# Patient Record
Sex: Female | Born: 1945 | Race: White | Smoking: Former smoker
Health system: Southern US, Community
[De-identification: ages and names within clinical notes are randomized; demographics above are authoritative.]

## PROBLEM LIST (undated history)

## (undated) DIAGNOSIS — G43909 Migraine, unspecified, not intractable, without status migrainosus: Secondary | ICD-10-CM

## (undated) DIAGNOSIS — N390 Urinary tract infection, site not specified: Secondary | ICD-10-CM

## (undated) DIAGNOSIS — Z9889 Other specified postprocedural states: Secondary | ICD-10-CM

## (undated) DIAGNOSIS — K219 Gastro-esophageal reflux disease without esophagitis: Secondary | ICD-10-CM

## (undated) DIAGNOSIS — R112 Nausea with vomiting, unspecified: Secondary | ICD-10-CM

## (undated) DIAGNOSIS — R32 Unspecified urinary incontinence: Secondary | ICD-10-CM

## (undated) DIAGNOSIS — T8859XA Other complications of anesthesia, initial encounter: Secondary | ICD-10-CM

## (undated) DIAGNOSIS — D649 Anemia, unspecified: Secondary | ICD-10-CM

## (undated) DIAGNOSIS — Z5189 Encounter for other specified aftercare: Secondary | ICD-10-CM

## (undated) DIAGNOSIS — G2581 Restless legs syndrome: Secondary | ICD-10-CM

## (undated) DIAGNOSIS — Z9989 Dependence on other enabling machines and devices: Secondary | ICD-10-CM

## (undated) DIAGNOSIS — M1990 Unspecified osteoarthritis, unspecified site: Secondary | ICD-10-CM

## (undated) DIAGNOSIS — T4145XA Adverse effect of unspecified anesthetic, initial encounter: Secondary | ICD-10-CM

## (undated) DIAGNOSIS — G4730 Sleep apnea, unspecified: Secondary | ICD-10-CM

## (undated) HISTORY — DX: Unspecified urinary incontinence: R32

## (undated) HISTORY — DX: Restless legs syndrome: G25.81

## (undated) HISTORY — DX: Dependence on other enabling machines and devices: Z99.89

## (undated) HISTORY — DX: Unspecified osteoarthritis, unspecified site: M19.90

## (undated) HISTORY — DX: Encounter for other specified aftercare: Z51.89

## (undated) HISTORY — PX: COLONOSCOPY: SHX174

## (undated) HISTORY — PX: APPENDECTOMY: SHX54

## (undated) HISTORY — PX: BREAST EXCISIONAL BIOPSY: SUR124

## (undated) HISTORY — PX: CHOLECYSTECTOMY: SHX55

## (undated) HISTORY — DX: Gastro-esophageal reflux disease without esophagitis: K21.9

## (undated) HISTORY — DX: Migraine, unspecified, not intractable, without status migrainosus: G43.909

## (undated) HISTORY — DX: Urinary tract infection, site not specified: N39.0

## (undated) HISTORY — PX: ABDOMINAL HYSTERECTOMY: SHX81

## (undated) HISTORY — DX: Anemia, unspecified: D64.9

## (undated) MED FILL — Iron Sucrose Inj 20 MG/ML (Fe Equiv): INTRAVENOUS | Qty: 10 | Status: AC

---

## 1972-10-02 HISTORY — PX: AUGMENTATION MAMMAPLASTY: SUR837

## 2011-10-03 HISTORY — PX: STOMACH SURGERY: SHX791

## 2013-10-02 DIAGNOSIS — G2581 Restless legs syndrome: Secondary | ICD-10-CM

## 2013-10-02 HISTORY — DX: Restless legs syndrome: G25.81

## 2017-05-02 HISTORY — PX: UPPER GASTROINTESTINAL ENDOSCOPY: SHX188

## 2017-07-31 DIAGNOSIS — K219 Gastro-esophageal reflux disease without esophagitis: Secondary | ICD-10-CM | POA: Insufficient documentation

## 2017-07-31 DIAGNOSIS — Z0000 Encounter for general adult medical examination without abnormal findings: Secondary | ICD-10-CM | POA: Insufficient documentation

## 2017-07-31 DIAGNOSIS — G4733 Obstructive sleep apnea (adult) (pediatric): Secondary | ICD-10-CM | POA: Insufficient documentation

## 2017-07-31 DIAGNOSIS — D500 Iron deficiency anemia secondary to blood loss (chronic): Secondary | ICD-10-CM | POA: Insufficient documentation

## 2017-07-31 DIAGNOSIS — Z9989 Dependence on other enabling machines and devices: Secondary | ICD-10-CM

## 2017-07-31 DIAGNOSIS — G43009 Migraine without aura, not intractable, without status migrainosus: Secondary | ICD-10-CM | POA: Insufficient documentation

## 2017-07-31 DIAGNOSIS — G2581 Restless legs syndrome: Secondary | ICD-10-CM | POA: Insufficient documentation

## 2017-08-31 ENCOUNTER — Ambulatory Visit: Payer: Self-pay | Admitting: Gastroenterology

## 2017-08-31 ENCOUNTER — Ambulatory Visit (INDEPENDENT_AMBULATORY_CARE_PROVIDER_SITE_OTHER): Payer: Medicare Other | Admitting: Gastroenterology

## 2017-08-31 ENCOUNTER — Encounter (INDEPENDENT_AMBULATORY_CARE_PROVIDER_SITE_OTHER): Payer: Self-pay

## 2017-08-31 ENCOUNTER — Encounter: Payer: Self-pay | Admitting: Gastroenterology

## 2017-08-31 VITALS — BP 128/71 | HR 93 | Temp 98.0°F | Ht 62.0 in | Wt 136.0 lb

## 2017-08-31 DIAGNOSIS — Z903 Acquired absence of stomach [part of]: Secondary | ICD-10-CM | POA: Diagnosis not present

## 2017-08-31 DIAGNOSIS — D508 Other iron deficiency anemias: Secondary | ICD-10-CM

## 2017-08-31 NOTE — Progress Notes (Signed)
[name], MD [address] [place], [place], [place], [address] [address], [place], [place], [place], [address] Phone: [phone]  Fax: [phone]  Consultation  Referring Provider:     [name], MD Primary Care Physician:  [name], MD Primary Gastroenterologist:  Virgel Manifold, MD        Reason for Consultation:     Anemia, Gastritis  Date of Consultation:  08/31/2017         HPI:   Jocelyn Case is a 71 y.o. female who recently moved here from [place], and presents for to establish GI care here.  I do not have access to any of her previous records, except for her primary care note from Dr. Ouida Sills last month.  She states all of her previous records from [place] were given to Dr. Tonette Bihari office, but they are not available in care everywhere at this time.   Patient has history of gastrectomy, that she states was done 10 years ago, due to ulcers.   since that surgery, she had an EGD in August 2018 due to nausea vomiting, with as per patient, initial EGD having food in stomach and so the procedure could not be completed.  2 weeks later the procedure was repeated after 2 days of fasting, and the procedure was able to be completed.  She states that they saw an ulcer with some bleeding at that time.  She has not had to have any EGDs other than that since the surgery or CT scans.  No admissions since then either.  After the surgery she had some nausea vomiting, and since then she has been on Reglan, and Zofran which controlled her symptoms.  She states she has been getting IV iron infusions as her iron has been low, she gets these every 6 months.  Last time was in April 2018.  She denies any dysphagia.  She denies any current nausea or vomiting.  It caused abdominal cramping and so that was stopped.  States that she was tried on oral iron but it caused abdominal cramping and so that was stopped.  She denies any altered bowel habits, no constipation  or diarrhea.  No blood in stool.  She reports history of heartburn, she is on Protonix once daily for this.  She still gets breakthrough symptoms 2-3 times a week if she eats broccoli or fatty foods.  She takes Pepto-Bismol for breakthrough symptoms and that helps.  She sleeps on a reclining bed.  Patient states she was seeing Dr. Gwynneth Albright at the [facility] for her IV iron infusions: [place], [place] Fax: [place], [place] Fax: [phone]  Her PCP was Dr. Monico Blitz, [place], [place]. [phone] Fax: [phone]    Past Medical History:  Diagnosis Date  . Anemia     Past Surgical History:  Procedure Laterality Date  . COLONOSCOPY  2 years ago  . UPPER GASTROINTESTINAL ENDOSCOPY  05/2017   pt stated "bleeding ulcer and stopped up duodenum was noted"    Prior to Admission medications   Medication Sig Start Date End Date Taking? Authorizing Provider  colestipol (COLESTID) 1 g tablet Take by mouth. 07/31/17 07/31/18 Yes [provider]  cyanocobalamin (,VITAMIN B-12,) 1000 MCG/ML injection Inject 1,000 mcg into the muscle every 30 (thirty) days.   Yes [provider]  estrogens, conjugated, (PREMARIN) 0.3 MG tablet Take by mouth. 07/31/17 07/31/18 Yes [provider]  folic acid (FOLVITE) 505 MCG tablet Take  by mouth. 07/31/17  Yes [provider]  metoCLOPramide (REGLAN) 5 MG tablet Take by mouth. 07/31/17 10/29/17 Yes [provider]  mirtazapine (REMERON) 15 MG tablet Take by mouth. 07/31/17 07/31/18 Yes [provider]  pantoprazole (PROTONIX) 40 MG tablet Take by mouth. 07/31/17 07/31/18 Yes [provider]  promethazine (PHENERGAN) 25 MG tablet Take by mouth. 07/31/17 10/29/17 Yes [provider]  rOPINIRole (REQUIP) 0.25 MG tablet Take by mouth. 07/31/17 07/31/18 Yes [provider]  sucralfate (CARAFATE) 1 g tablet Take by mouth. 07/31/17  07/31/18 Yes [provider]   Family history reviewed, colon cancer in father, stomach cancer in mother.   Social History   Tobacco Use  . Smoking status: Never Smoker  . Smokeless tobacco: Never Used  Substance Use Topics  . Alcohol use: Yes    Comment: drinks wine with dinner  . Drug use: No    No known drug allergies  Review of Systems:    All systems reviewed and negative except where noted in HPI.   Physical Exam:  Vital signs in last 24 hours: Vitals:   08/31/17 1034  BP: 128/71  Pulse: 93  Temp: 98 F (36.7 C)  TempSrc: Oral  Weight: 61.7 kg (136 lb)  Height: 5\' 2"  (1.575 m)     General:   Pleasant, cooperative in NAD Head:  Normocephalic and atraumatic. Eyes:   No icterus.   Conjunctiva pink. PERRLA. Ears:  Normal auditory acuity. Neck:  Supple; no masses or thyroidomegaly Lungs: Respirations even and unlabored. Lungs clear to auscultation bilaterally.   No wheezes, crackles, or rhonchi.  Heart:  Regular rate and rhythm;  Without murmur, clicks, rubs or gallops Abdomen:  Soft, nondistended, nontender. Normal bowel sounds. No appreciable masses or hepatomegaly.  No rebound or guarding.  Neurologic:  Alert and oriented x3;  grossly normal neurologically. Skin:  Intact without significant lesions or rashes. Cervical Nodes:  No significant cervical adenopathy. Psych:  Alert and cooperative. Normal affect.  LAB RESULTS: CBC in care everywhere from July 31, 2017 shows hemoglobin of 10.8, MCV 100.8 platelets 302, white blood cell count 7.  No CMP, no iron workup available in care everywhere   STUDIES: No results found.    Impression / Plan:   Jocelyn Case is a 71 y.o. 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 We will need to review these records in detail determine if any further workup at night as needed at this time.  However, patient has no nausea vomiting, dysphagia, blood in stool at this ti will need to review her e to indicate endoscopy. After review of previous records, will change management if needed We will need to review if previous anemia workup has been done, including iron levels, B12 levels. B12 shots are listed on her medication list, and the primary may have already checked this. her MCV is elevatedwhich would indicate B12 deficiency. If iron deficiency is present, she may need to be set up for IV iron infusions. If none of these have been tested recently, after reviewing records from PCP, can obtain lab  testing that has not been done recently.    Patient reports a colonoscopy Was done 2 years ago and states that it was normal.  We will need to review this record as well.  Given her family history of colon cancer, if the colonoscopy was completely normal, next colonoscopy would be indicated in 5 years from the last colonoscopy.    Thank you for involving me in the care of this patient.    Virgel Manifold, MD  08/31/2017, 11:34 AM

## 2017-11-05 ENCOUNTER — Ambulatory Visit (INDEPENDENT_AMBULATORY_CARE_PROVIDER_SITE_OTHER): Payer: Medicare Other | Admitting: Gastroenterology

## 2017-11-05 ENCOUNTER — Encounter (INDEPENDENT_AMBULATORY_CARE_PROVIDER_SITE_OTHER): Payer: Self-pay

## 2017-11-05 ENCOUNTER — Other Ambulatory Visit: Payer: Self-pay

## 2017-11-05 ENCOUNTER — Other Ambulatory Visit
Admission: RE | Admit: 2017-11-05 | Discharge: 2017-11-05 | Disposition: A | Discharge status: Home / Self Care | Payer: Medicare Other | Source: Ambulatory Visit | Attending: Gastroenterology | Admitting: Gastroenterology

## 2017-11-05 ENCOUNTER — Telehealth: Payer: Self-pay | Admitting: Gastroenterology

## 2017-11-05 ENCOUNTER — Encounter: Payer: Self-pay | Admitting: Gastroenterology

## 2017-11-05 VITALS — BP 105/67 | HR 97 | Ht 62.0 in | Wt 137.2 lb

## 2017-11-05 DIAGNOSIS — D509 Iron deficiency anemia, unspecified: Secondary | ICD-10-CM

## 2017-11-05 LAB — CBC WITH DIFFERENTIAL/PLATELET
Basophils Absolute: 0 10*3/uL (ref 0–0.1)
Basophils Relative: 0 %
EOS ABS: 0.3 10*3/uL (ref 0–0.7)
EOS PCT: 6 %
HCT: 32 % — ABNORMAL LOW (ref 35.0–47.0)
Hemoglobin: 10 g/dL — ABNORMAL LOW (ref 12.0–16.0)
LYMPHS ABS: 1 10*3/uL (ref 1.0–3.6)
LYMPHS PCT: 19 %
MCH: 27 pg (ref 26.0–34.0)
MCHC: 31.4 g/dL — AB (ref 32.0–36.0)
MCV: 86 fL (ref 80.0–100.0)
MONO ABS: 0.6 10*3/uL (ref 0.2–0.9)
MONOS PCT: 11 %
Neutro Abs: 3.4 10*3/uL (ref 1.4–6.5)
Neutrophils Relative %: 64 %
PLATELETS: 299 10*3/uL (ref 150–440)
RBC: 3.72 MIL/uL — AB (ref 3.80–5.20)
RDW: 14.2 % (ref 11.5–14.5)
WBC: 5.3 10*3/uL (ref 3.6–11.0)

## 2017-11-05 LAB — COMPREHENSIVE METABOLIC PANEL
ALT: 19 U/L (ref 14–54)
ANION GAP: 9 (ref 5–15)
AST: 25 U/L (ref 15–41)
Albumin: 2.7 g/dL — ABNORMAL LOW (ref 3.5–5.0)
Alkaline Phosphatase: 55 U/L (ref 38–126)
BUN: 19 mg/dL (ref 6–20)
CHLORIDE: 105 mmol/L (ref 101–111)
CO2: 23 mmol/L (ref 22–32)
CREATININE: 0.57 mg/dL (ref 0.44–1.00)
Calcium: 8.3 mg/dL — ABNORMAL LOW (ref 8.9–10.3)
Glucose, Bld: 97 mg/dL (ref 65–99)
Potassium: 3.8 mmol/L (ref 3.5–5.1)
SODIUM: 137 mmol/L (ref 135–145)
Total Bilirubin: 0.4 mg/dL (ref 0.3–1.2)
Total Protein: 5.5 g/dL — ABNORMAL LOW (ref 6.5–8.1)

## 2017-11-05 LAB — IRON AND TIBC
IRON: 24 ug/dL — AB (ref 28–170)
Saturation Ratios: 6 % — ABNORMAL LOW (ref 10.4–31.8)
TIBC: 410 ug/dL (ref 250–450)
UIBC: 386 ug/dL

## 2017-11-05 LAB — FERRITIN: FERRITIN: 5 ng/mL — AB (ref 11–307)

## 2017-11-05 NOTE — Telephone Encounter (Signed)
The office you called for patient's previous records called & l/m on answering machine with no number or name of whom was leaving the message.She stated they had no previous records for GI/

## 2017-11-05 NOTE — Progress Notes (Signed)
Jocelyn Antigua, MD [address]  [place]  [place], [place] [address]  Main: [phone]  Fax: [phone]   Primary Care Physician: Jocelyn Ruths, MD  Primary Gastroenterologist:  Jocelyn Case  Chief Complaint  Patient presents with  . Follow-up  For gastrectomy, iron deficiency, gastric ulcer  HPI: Jocelyn Case is a 72 y.o. female initially seen on August 31, 2017 due to previous history of gastric ulcer and gastrectomy in [place] presents for follow-up.  We try to obtain patient's previous records from her PCP, Dr. Tonette Case office and so has not received these records.  Patient reports, last week she started having episodes of nausea and vomiting along with loose stools.  The worst day was 3 days ago, with multiple episodes of emesis and 3-4 loose stools that day.  No fever or chills.  No emesis or loose stools yesterday or the day before.  No dysphagia.  Reports some odynophagia since the episodes of emesis, however this is improving.  Denies any melena or hematochezia.  Denies any hematemesis, but states that some of her emesis had streaks of pink in it.  No abdominal pain.  As per my previous notes and as per history obtained from the patient: "Patient has history of gastrectomy, that she states was done 10 years ago, due to ulcers.   since that surgery, she had an EGD in August 2018 due to nausea vomiting, with as per patient, initial EGD having food in stomach and so the procedure could not be completed.  2 weeks later the procedure was repeated after 2 days of fasting, and the procedure was able to be completed.  She states that they saw an ulcer with some bleeding at that time.  She has not had to have any EGDs other than that since the surgery or CT scans.  No admissions since then either.  After the surgery she had some nausea vomiting, and since then she has been on Reglan, and Zofran which controlled her symptoms.  She states she has been getting IV  iron infusions as her iron has been low, she gets these every 6 months.  Last time was in April 2018. States that she was tried on oral iron but it caused abdominal cramping and so that was stopped.Marland KitchenMarland KitchenShe reports history of heartburn, she is on Protonix once daily for this.  She still gets breakthrough symptoms 2-3 times a week if she eats broccoli or fatty foods.  She takes Pepto-Bismol for breakthrough symptoms and that helps.  She sleeps on a reclining bed.  Patient states she was seeing Dr. Gwynneth Case at the [facility] for her IV iron infusions: [place], [place] Fax: [place], [place] Fax: [phone]  Her PCP was Dr. Monico Case, [place], [place]. [phone] Fax: [phone]"      Current Outpatient Medications  Medication Sig Dispense Refill  . cholecalciferol (VITAMIN D) 400 units TABS tablet Take 2,000 Units by mouth daily.    . colestipol (COLESTID) 1 g tablet Take by mouth.    . cyanocobalamin (,VITAMIN B-12,) 1000 MCG/ML injection Inject 1,000 mcg into the muscle every 30 (thirty) days.    . folic acid (FOLVITE) 425 MCG tablet Take by mouth.    . mirtazapine (REMERON) 15 MG tablet Take by mouth.    . pantoprazole (PROTONIX) 40 MG tablet Take by mouth.    Marland Kitchen rOPINIRole (REQUIP) 0.25 MG tablet Take by mouth.    . sucralfate (CARAFATE) 1 g tablet  Take by mouth.    . estrogens, conjugated, (PREMARIN) 0.3 MG tablet Take by mouth.    . metoCLOPramide (REGLAN) 5 MG tablet Take by mouth.    . promethazine (PHENERGAN) 25 MG tablet Take by mouth.     No current facility-administered medications for this visit.     Allergies as of 11/05/2017  . (No Known Allergies)    ROS:  General: Negative for anorexia, weight loss, fever, chills, fatigue, weakness. ENT: Negative for hoarseness, difficulty swallowing , nasal congestion. CV: Negative for chest pain, angina, palpitations, dyspnea on exertion, peripheral edema.    Respiratory: Negative for dyspnea at rest, dyspnea on exertion, cough, sputum, wheezing.  GI: See history of present illness. GU:  Negative for dysuria, hematuria, urinary incontinence, urinary frequency, nocturnal urination.  Endo: Negative for unusual weight change.    Physical Examination:   BP 105/67   Pulse 97   Ht 5\' 2"  (1.575 m)   Wt 137 lb 3.2 oz (62.2 kg)   BMI 25.09 kg/m   General: Well-nourished, well-developed in no acute distress.  Eyes: No icterus. Conjunctivae pink. Mouth: Oropharyngeal mucosa moist and pink , no lesions erythema or exudate. Lungs: Clear to auscultation bilaterally. Non-labored. Heart: Regular rate and rhythm, no murmurs rubs or gallops.  Abdomen: Bowel sounds are normal, nontender, nondistended, no hepatosplenomegaly or masses, no abdominal bruits or hernia , no rebound or guarding.   Extremities: No lower extremity edema. No clubbing or deformities. Neuro: Alert and oriented x 3.  Grossly intact. Skin: Warm and dry, no jaundice.   Psych: Alert and cooperative, normal mood and affect.   Labs: CMP  No results found for: NA, K, CL, CO2, GLUCOSE, BUN, CREATININE, CALCIUM, PROT, ALBUMIN, AST, ALT, ALKPHOS, BILITOT, GFRNONAA, GFRAA No results found for: WBC, HGB, HCT, MCV, PLT  Imaging Studies: No results found.  Assessment and Plan:   Jocelyn Case is a 72 y.o. y/o female here for follow-up with history of gastric ulcers, gastrectomy 10 years ago, who recently moved here from [place]  Will attempt to obtain her records again. I have message Dr. Ouida Case to the epic messaging system.  My nurses also called his office to request her [place] records again. We will also send [place] PCP, [place] hematologist as we will really need to review previous records, especially her August 2018 EGD prior to proceeding with any procedures.  At this time, she reports having nausea vomiting and diarrhea last week that is improving.  She may have had  gastroenteritis.  Would recommend gastroenteritis to resolve prior to any procedures.   We will obtain labs and stool testing at this time.   We will also obtain iron and B12 testing.  No alarm symptoms present to present to indicate emergent EGD at this time If symptoms worsen, patient was asked to call us and she verbalized understanding.  Dr Jocelyn Case

## 2017-11-06 ENCOUNTER — Other Ambulatory Visit
Admission: RE | Admit: 2017-11-06 | Discharge: 2017-11-06 | Disposition: A | Discharge status: Home / Self Care | Payer: Medicare Other | Source: Ambulatory Visit | Attending: Gastroenterology | Admitting: Gastroenterology

## 2017-11-06 DIAGNOSIS — R197 Diarrhea, unspecified: Secondary | ICD-10-CM | POA: Insufficient documentation

## 2017-11-06 DIAGNOSIS — R112 Nausea with vomiting, unspecified: Secondary | ICD-10-CM | POA: Insufficient documentation

## 2017-11-06 LAB — GASTROINTESTINAL PANEL BY PCR, STOOL (REPLACES STOOL CULTURE)

## 2017-11-06 LAB — C DIFFICILE QUICK SCREEN W PCR REFLEX
C DIFFICILE (CDIFF) TOXIN: NEGATIVE
C DIFFICLE (CDIFF) ANTIGEN: NEGATIVE
C Diff interpretation: NOT DETECTED

## 2017-11-13 ENCOUNTER — Other Ambulatory Visit: Payer: Self-pay

## 2017-11-13 DIAGNOSIS — E611 Iron deficiency: Secondary | ICD-10-CM

## 2017-11-13 NOTE — Telephone Encounter (Signed)
-----   Message from Virgel Manifold, MD sent at 11/08/2017  4:02 PM EST ----- Jocelyn Case please let patient know, her iron levels are low and we are referring her for IV iron infusions to the cancer center is aware that IV iron infusions are done In the referral to the [facility] please state the Indication as: "History of iron deficiency, history of B12 deficiency, see previous scanned records in chart"  Please let her know that we have received her records, including her EGD records.  She will need an EGD, but we are waiting for the colonoscopy record to see if she needs a colonoscopy with the EGD.

## 2017-11-13 NOTE — Telephone Encounter (Signed)
Pt notified of low iron levels and of referral to the cancer center for IV iron infusions. Also informed that we are trying to get her colonoscopy results.

## 2017-11-19 NOTE — Telephone Encounter (Addendum)
I sent for records with the correct fax number on 11/14/17.  The office I found gave me the phone number instead.

## 2017-11-22 ENCOUNTER — Telehealth: Payer: Self-pay | Admitting: Gastroenterology

## 2017-11-22 NOTE — Telephone Encounter (Signed)
Please let patient know we have received some of her previous records.  Please schedule her for an EGD to evaluate her anastomotic ulcer seen on her previous EGD.  For documentation purposes only.  Her GI notes state that patient had a colonoscopy revealing redundant colon and hemorrhoids.  And next colonoscopy should be in 5 years.  We still do not have the colonoscopy report itself.

## 2017-11-23 ENCOUNTER — Encounter: Payer: Self-pay | Admitting: Hematology and Oncology

## 2017-11-23 ENCOUNTER — Inpatient Hospital Stay: Payer: Medicare Other

## 2017-11-23 ENCOUNTER — Inpatient Hospital Stay: Payer: Medicare Other | Attending: Hematology and Oncology | Admitting: Hematology and Oncology

## 2017-11-23 ENCOUNTER — Other Ambulatory Visit: Payer: Self-pay

## 2017-11-23 VITALS — BP 138/80 | HR 94 | Temp 97.5°F | Resp 18 | Ht 64.17 in | Wt 137.1 lb

## 2017-11-23 DIAGNOSIS — Z931 Gastrostomy status: Secondary | ICD-10-CM | POA: Diagnosis not present

## 2017-11-23 DIAGNOSIS — D500 Iron deficiency anemia secondary to blood loss (chronic): Secondary | ICD-10-CM

## 2017-11-23 DIAGNOSIS — E538 Deficiency of other specified B group vitamins: Secondary | ICD-10-CM

## 2017-11-23 DIAGNOSIS — D509 Iron deficiency anemia, unspecified: Secondary | ICD-10-CM

## 2017-11-23 LAB — CBC WITH DIFFERENTIAL/PLATELET
BASOS ABS: 0 10*3/uL (ref 0–0.1)
Basophils Relative: 1 %
Eosinophils Absolute: 0.5 10*3/uL (ref 0–0.7)
Eosinophils Relative: 9 %
HCT: 31.4 % — ABNORMAL LOW (ref 35.0–47.0)
HEMOGLOBIN: 9.6 g/dL — AB (ref 12.0–16.0)
Lymphocytes Relative: 24 %
Lymphs Abs: 1.3 10*3/uL (ref 1.0–3.6)
MCH: 25.9 pg — ABNORMAL LOW (ref 26.0–34.0)
MCHC: 30.6 g/dL — ABNORMAL LOW (ref 32.0–36.0)
MCV: 84.7 fL (ref 80.0–100.0)
MONOS PCT: 10 %
Monocytes Absolute: 0.6 10*3/uL (ref 0.2–0.9)
NEUTROS ABS: 3.2 10*3/uL (ref 1.4–6.5)
NEUTROS PCT: 56 %
Platelets: 379 10*3/uL (ref 150–440)
RBC: 3.71 MIL/uL — ABNORMAL LOW (ref 3.80–5.20)
RDW: 14.4 % (ref 11.5–14.5)
WBC: 5.6 10*3/uL (ref 3.6–11.0)

## 2017-11-23 LAB — FOLATE: FOLATE: 28 ng/mL (ref >5.9)

## 2017-11-23 LAB — FERRITIN: Ferritin: 7 ng/mL — ABNORMAL LOW (ref 11–307)

## 2017-11-23 LAB — VITAMIN B12: VITAMIN B 12: 267 pg/mL (ref 180–914)

## 2017-11-23 NOTE — Progress Notes (Signed)
here for for ew pt evaluation

## 2017-11-23 NOTE — Progress Notes (Signed)
[facility] day:  11/23/2017  Chief Complaint: Jocelyn Case is a 72 y.o. female s/p partial gastrectomy and low iron who is referred in consultation by Dr [name] for assessment and management.  HPI: The patient underwent partial gastrectomy approximately 10 years ago due to ulcers.  She subsequently received IV iron off and on x 3 years.  She last received IV iron in 12/2016.  She denies any side effects of IV iron.  She is unaware of the type of IV iron she received (Venofer, Feraheme or Infed).  She previously tried oral iron but this caused abdominal cramping.  CBC on 11/05/2017 revealed a hematocrit of 32, hemoglobin 10, MCV 86, platelets 299,000, white count 5300 with an ANC of 3400.  Differential was unremarkable.  Ferritin was 5.  Iron studies revealed a saturation of 6% and a TIBC of 410.  Creatinine was 0.57 and albumin was 2.7.  She underwent EGD in 05/2017 secondary to nausea and vomiting.  There was retained food.  EGD after 2 days of fasting revealed an ulcer with some bleeding.    Regarding her diet, she eats very little as it causes an upset stomach.  She has nausea 2-3 x/week.  She uses ondansetron and metoclopramide.  She describes eating "bland food and comfort food".  She does not eat fruit.  She denies ice pica.  She craves tomato juice and tomato products.  Symptomatically, she notes fatigue.  She is "sleepy and tired".  She states that she has a "serious case of I don't want to".  She has had a 25 pound weight gain since 06/02/2017.  She has had restless leg x 3-4 years.  She has reflux for which she uses Protonix.  Pepto-Bismol is used for breakthrough symptoms. She denies any hematuria or vaginal bleeding.  She denies any family history of blood disorders. Her father had colon cancer.  Her mother had a perforated ulcer.   Past Medical History:  Diagnosis Date  . Anemia   . Blood transfusion without reported diagnosis    . GERD (gastroesophageal reflux disease)   . Restless leg syndrome 2015   R leg     Past Surgical History:  Procedure Laterality Date  . ABDOMINAL HYSTERECTOMY    . APPENDECTOMY    . CHOLECYSTECTOMY    . COLONOSCOPY  2 years ago  . STOMACH SURGERY  2013  . UPPER GASTROINTESTINAL ENDOSCOPY  05/2017   pt stated "bleeding ulcer and stopped up duodenum was noted"    Family History  Problem Relation Age of Onset  . Peptic Ulcer Disease Mother   . Dementia Mother   . COPD Father   . Colon cancer Father     Social History:  reports that  has never smoked. she has never used smokeless tobacco. She reports that she drinks alcohol. She reports that she does not use drugs.  She drinks wine in the evening.  She previously lived in [place].  She moved to [place] in 05/2017.  Her daughter lives in [place].  She is temporarily living in an apartment in [place].  The patient is alone today.  Allergies: No Known Allergies  Current Medications: Current Outpatient Medications  Medication Sig Dispense Refill  . cholecalciferol (VITAMIN D) 400 units TABS tablet Take 2,000 Units by mouth daily.    . colestipol (COLESTID) 1 g tablet Take 1 g by mouth.     . folic acid (FOLVITE) 782 MCG tablet  Take 400 mcg by mouth daily.     . metoCLOPramide (REGLAN) 10 MG tablet Take 10 mg by mouth 4 (four) times daily.    . mirtazapine (REMERON) 15 MG tablet Take 15 mg by mouth at bedtime.     . pantoprazole (PROTONIX) 40 MG tablet Take 40 mg by mouth daily.     . promethazine (PHENERGAN) 25 MG tablet Take by mouth.     Marland Kitchen rOPINIRole (REQUIP) 0.25 MG tablet Take 0.25 mg by mouth at bedtime.     . sucralfate (CARAFATE) 1 g tablet Take 1 g by mouth 4 (four) times daily.     . cyanocobalamin (,VITAMIN B-12,) 1000 MCG/ML injection Inject 1,000 mcg into the muscle every 30 (thirty) days.    . metoCLOPramide (REGLAN) 5 MG tablet Take by mouth.     No current facility-administered medications for  this visit.     Review of Systems:  GENERAL:  Fatigue.  Sleepy/tired.  No fevers or sweats.  Weight gain of 25 pounds since 06/2017. PERFORMANCE STATUS (ECOG):  1 HEENT:  No visual changes, runny nose, sore throat, mouth sores or tenderness. Lungs: No shortness of breath or cough.  No hemoptysis. Cardiac:  No chest pain, palpitations, orthopnea, or PND. GI:  Reflux.  Nausea (see HPI).  No vomiting, diarrhea, constipation, melena or hematochezia. GU:  No urgency, frequency, dysuria, or hematuria. Musculoskeletal:  No back pain.  No joint pain.  No muscle tenderness. Extremities:  No pain or swelling. Skin:  No rashes or skin changes. Neuro:  Restless leg.  No headache, numbness or weakness, balance or coordination issues. Endocrine:  No diabetes, thyroid issues, hot flashes or night sweats. Psych:  No mood changes, depression or anxiety. Pain:  No focal pain. Review of systems:  All other systems reviewed and found to be negative.  Physical Exam: Blood pressure 138/80, pulse 94, temperature (!) 97.5 F (36.4 C), temperature source Tympanic, resp. rate 18, height 5' 4.17" (1.63 m), weight 137 lb 1.6 oz (62.2 kg). GENERAL:  Well developed, well nourished,woman sitting comfortably in the exam room in no acute distress. MENTAL STATUS:  Alert and oriented to person, place and time. HEAD:  Short graying brown hair.  Normocephalic, atraumatic, face symmetric, no Cushingoid features. EYES:  Blue eyes.  Pupils equal round and reactive to light and accomodation.  No conjunctivitis or scleral icterus. ENT:  Hearing aide.  Oropharynx clear without lesion.  Tongue normal. Mucous membranes moist.  RESPIRATORY:  Clear to auscultation without rales, wheezes or rhonchi. CARDIOVASCULAR:  Regular rate and rhythm without murmur, rub or gallop. ABDOMEN:  Soft, non-tender, with active bowel sounds, and no hepatosplenomegaly.  No masses. SKIN:  No rashes, ulcers or lesions. EXTREMITIES: No edema, no skin  discoloration or tenderness.  No palpable cords. LYMPH NODES: No palpable cervical, supraclavicular, axillary or inguinal adenopathy  NEUROLOGICAL: Unremarkable. PSYCH:  Appropriate.   Appointment on 11/23/2017  Component Date Value Ref Range Status  . Folate 11/23/2017 28.0  >5.9 ng/mL Final   Performed at [facility], [address] [place], [place] [address]  . WBC 11/23/2017 5.6  3.6 - 11.0 K/uL Final  . RBC 11/23/2017 3.71* 3.80 - 5.20 MIL/uL Final  . Hemoglobin 11/23/2017 9.6* 12.0 - 16.0 g/dL Final  . HCT 11/23/2017 31.4* 35.0 - 47.0 % Final  . MCV 11/23/2017 84.7  80.0 - 100.0 fL Final  . MCH 11/23/2017 25.9* 26.0 - 34.0 pg Final  . MCHC 11/23/2017 30.6* 32.0 - 36.0 g/dL  Final  . RDW 11/23/2017 14.4  11.5 - 14.5 % Final  . Platelets 11/23/2017 379  150 - 440 K/uL Final  . Neutrophils Relative % 11/23/2017 56  % Final  . Neutro Abs 11/23/2017 3.2  1.4 - 6.5 K/uL Final  . Lymphocytes Relative 11/23/2017 24  % Final  . Lymphs Abs 11/23/2017 1.3  1.0 - 3.6 K/uL Final  . Monocytes Relative 11/23/2017 10  % Final  . Monocytes Absolute 11/23/2017 0.6  0.2 - 0.9 K/uL Final  . Eosinophils Relative 11/23/2017 9  % Final  . Eosinophils Absolute 11/23/2017 0.5  0 - 0.7 K/uL Final  . Basophils Relative 11/23/2017 1  % Final  . Basophils Absolute 11/23/2017 0.0  0 - 0.1 K/uL Final   Performed at [facility], [address], [place], [place] [address]  . Ferritin 11/23/2017 7* 11 - 307 ng/mL Final   Performed at [facility], [address] [place], [place] [address]  . Vitamin B-12 11/23/2017 267  180 - 914 pg/mL Final   Comment: (NOTE) This assay is not validated for testing neonatal or myeloproliferative syndrome specimens for Vitamin B12 levels. Performed at [facility], [place] [address] [place], [place] [address]     Assessment:  Jocelyn Case is a 72 y.o. female s/p partial gastrectomy in 2009 and subsequent iron deficiency anemia.  She  previously received IV iron off/on every 6 months for the past 3 years while living in [place].  She is intolerant of oral iron.  EGD in [place] in 05/2017 revealed retained food.  EGD after 2 days of fasting revealed an ulcer with some bleeding.  She is followed by Dr. [name].  CBC on 11/05/2017 revealed a hematocrit of 32, hemoglobin 10, and MCV 86.  Ferritin was 5.  Iron saturation was 6% and a TIBC of 410.  She receives B12 IM monthly (last while in [place]).  She is on folic acid.  Symptomatically, she notes fatigue.  She has nausea.  Exam is unremarkable.  Hemoglobin is 9.6.  Plan: 1.  Discuss diagnosis and management of iron deficiency s/p gastrectomy. 2.  Discuss B12 deficiency s/p gastrectomy.  Patient recieved B12 monthly in [place]. 3.  Obtain records from prior hematologist-oncologist (Dr Gwynneth Albright at [facility] in [place] [phone] and [phone]; Melina Fiddler [phone] and [phone]). 4.  Labs today:  CBC with diff, ferritin, B12, folate. 5.  Preauth Venofer. 6.  RTC weekly x 4 for Venofer. 7.  RTC 2 weeks after last infusion for MD assessment and labs (CBC with diff, ferritin).   Nolon Stalls, MD 11/23/2017, 4:35 PM

## 2017-11-29 ENCOUNTER — Other Ambulatory Visit: Payer: Self-pay | Admitting: Hematology and Oncology

## 2017-11-30 ENCOUNTER — Inpatient Hospital Stay: Payer: Medicare Other | Attending: Hematology and Oncology

## 2017-11-30 ENCOUNTER — Other Ambulatory Visit: Payer: Self-pay | Admitting: Hematology and Oncology

## 2017-11-30 VITALS — BP 112/72 | HR 86 | Temp 98.2°F | Resp 18

## 2017-11-30 DIAGNOSIS — D509 Iron deficiency anemia, unspecified: Secondary | ICD-10-CM | POA: Diagnosis present

## 2017-11-30 DIAGNOSIS — E538 Deficiency of other specified B group vitamins: Secondary | ICD-10-CM | POA: Insufficient documentation

## 2017-11-30 DIAGNOSIS — D500 Iron deficiency anemia secondary to blood loss (chronic): Secondary | ICD-10-CM

## 2017-11-30 MED ORDER — IRON SUCROSE 20 MG/ML IV SOLN
200.0000 mg | Freq: Once | INTRAVENOUS | Status: AC
  Administered 2017-11-30: 200 mg via INTRAVENOUS
  Filled 2017-11-30: qty 10

## 2017-11-30 MED ORDER — SODIUM CHLORIDE 0.9 % IV SOLN
Freq: Once | INTRAVENOUS | Status: AC
  Administered 2017-11-30: 14:00:00 via INTRAVENOUS
  Filled 2017-11-30: qty 1000

## 2017-12-03 ENCOUNTER — Telehealth: Payer: Self-pay | Admitting: *Deleted

## 2017-12-03 NOTE — Telephone Encounter (Signed)
-----   Message from Lequita Asal, MD sent at 12/02/2017  8:03 AM EST ----- Regarding: Please call patient  Confirm that she has not received B12 since she left [place] in 05/2017.  If she has not received B12, she needs to start.  Orders in for monthly B12.  She can receive her first B12 when she comes in for her next Venofer.  M

## 2017-12-03 NOTE — Telephone Encounter (Signed)
Called patient and LVM to inquire if she has been receiving B-12 injections since she left [place]. MD recommends she get them if she has not been.  She would like her to get them monthly and can start this Friday when she comes for venofer treatment.  Asked patient to call me back to answer the question as to whether or not she has been taking B-12.

## 2017-12-03 NOTE — Telephone Encounter (Signed)
Patient called back to confirm that she has not received B-12 injections since 05-2017.

## 2017-12-04 NOTE — Telephone Encounter (Signed)
  Let's get her started.  M

## 2017-12-07 ENCOUNTER — Inpatient Hospital Stay: Payer: Medicare Other

## 2017-12-07 ENCOUNTER — Other Ambulatory Visit: Payer: Self-pay | Admitting: Hematology and Oncology

## 2017-12-07 VITALS — BP 107/71 | HR 84 | Temp 98.6°F | Resp 18

## 2017-12-07 DIAGNOSIS — D509 Iron deficiency anemia, unspecified: Secondary | ICD-10-CM | POA: Diagnosis not present

## 2017-12-07 DIAGNOSIS — D500 Iron deficiency anemia secondary to blood loss (chronic): Secondary | ICD-10-CM

## 2017-12-07 MED ORDER — SODIUM CHLORIDE 0.9 % IV SOLN
Freq: Once | INTRAVENOUS | Status: AC
  Administered 2017-12-07: 14:00:00 via INTRAVENOUS
  Filled 2017-12-07: qty 1000

## 2017-12-07 MED ORDER — IRON SUCROSE 20 MG/ML IV SOLN
200.0000 mg | Freq: Once | INTRAVENOUS | Status: AC
  Administered 2017-12-07: 200 mg via INTRAVENOUS
  Filled 2017-12-07: qty 10

## 2017-12-07 MED ORDER — CYANOCOBALAMIN 1000 MCG/ML IJ SOLN
1000.0000 ug | Freq: Once | INTRAMUSCULAR | Status: AC
Start: 2017-12-07 — End: 2017-12-07
  Administered 2017-12-07: 1000 ug via INTRAMUSCULAR
  Filled 2017-12-07: qty 1

## 2017-12-14 ENCOUNTER — Inpatient Hospital Stay: Payer: Medicare Other

## 2017-12-14 VITALS — BP 106/80 | HR 81 | Temp 97.1°F | Resp 16

## 2017-12-14 DIAGNOSIS — D500 Iron deficiency anemia secondary to blood loss (chronic): Secondary | ICD-10-CM

## 2017-12-14 DIAGNOSIS — D509 Iron deficiency anemia, unspecified: Secondary | ICD-10-CM | POA: Diagnosis not present

## 2017-12-14 MED ORDER — IRON SUCROSE 20 MG/ML IV SOLN
200.0000 mg | Freq: Once | INTRAVENOUS | Status: AC
  Administered 2017-12-14: 200 mg via INTRAVENOUS
  Filled 2017-12-14: qty 10

## 2017-12-14 MED ORDER — SODIUM CHLORIDE 0.9 % IV SOLN
Freq: Once | INTRAVENOUS | Status: AC
  Administered 2017-12-14: 14:00:00 via INTRAVENOUS
  Filled 2017-12-14: qty 1000

## 2017-12-20 ENCOUNTER — Telehealth: Payer: Self-pay | Admitting: Gastroenterology

## 2017-12-20 NOTE — Telephone Encounter (Signed)
Pt needs refill on rx  Protonix Generic form Pantoprazole sod 40 mg 1 daily   And rx Mirtazapine  Tablets 15 mg 1 tablet at night  And rx  requit tablets .25 mg 1 daily and rx promethazine acl tablets 25 mg 1  Tablet twice daily and rx sucralfate 4 times daily  [facility] fax # [phone] call at [phone] Any questyions pt will be home all day today.

## 2017-12-21 ENCOUNTER — Inpatient Hospital Stay: Payer: Medicare Other

## 2017-12-21 VITALS — BP 117/75 | HR 90 | Temp 96.9°F | Resp 17

## 2017-12-21 DIAGNOSIS — D509 Iron deficiency anemia, unspecified: Secondary | ICD-10-CM | POA: Diagnosis not present

## 2017-12-21 DIAGNOSIS — D500 Iron deficiency anemia secondary to blood loss (chronic): Secondary | ICD-10-CM

## 2017-12-21 MED ORDER — SODIUM CHLORIDE 0.9 % IV SOLN
Freq: Once | INTRAVENOUS | Status: AC
  Administered 2017-12-21: 14:00:00 via INTRAVENOUS
  Filled 2017-12-21: qty 1000

## 2017-12-21 MED ORDER — IRON SUCROSE 20 MG/ML IV SOLN
200.0000 mg | Freq: Once | INTRAVENOUS | Status: AC
  Administered 2017-12-21: 200 mg via INTRAVENOUS
  Filled 2017-12-21: qty 10

## 2017-12-24 ENCOUNTER — Other Ambulatory Visit: Payer: Self-pay

## 2017-12-24 ENCOUNTER — Telehealth: Payer: Self-pay | Admitting: Gastroenterology

## 2017-12-24 DIAGNOSIS — Z8719 Personal history of other diseases of the digestive system: Secondary | ICD-10-CM

## 2017-12-24 DIAGNOSIS — D500 Iron deficiency anemia secondary to blood loss (chronic): Secondary | ICD-10-CM

## 2017-12-24 MED ORDER — PANTOPRAZOLE SODIUM 40 MG PO TBEC: 40.0000 mg | DELAYED_RELEASE_TABLET | Freq: Every day | ORAL | 3 refills | Status: DC

## 2017-12-24 NOTE — Telephone Encounter (Signed)
EGD has been scheduled for 01/09/2018.

## 2017-12-24 NOTE — Telephone Encounter (Signed)
Patient Jocelyn Case and needs to r/s her EGD with Dr. Bonna Gains. She has another doctor appt.

## 2017-12-24 NOTE — Telephone Encounter (Signed)
Spoke with pt and have scheduled EGD for January 04, 2018 at [facility]. Also numbers given for [facility] did not give any pharmacy. When I spoke with pt she gave [facility], phone number: [phone]. Number did not work in system but will call them.  Pt also has a new address: [address], [place], [place] [address]. Will mail instructions for EGD.

## 2017-12-24 NOTE — Telephone Encounter (Signed)
Procedure EGD rescheduled to 01/09/2018. Also informed of contacting PCP for other medications except for Protonix and pt of prescription and of difficulty located pharmacy by any number given including when I called [facility]. Pt was advised to contact them and let them know of change of address.

## 2018-01-02 ENCOUNTER — Telehealth: Payer: Self-pay | Admitting: Gastroenterology

## 2018-01-02 NOTE — Telephone Encounter (Signed)
Pt left vm to reschedule her procedure for 01/09/18 please call pt

## 2018-01-03 NOTE — Telephone Encounter (Signed)
Pt came by office today and wanted to cancel procedure for 01/09/18 and reschedule for June. Recall letter to be sent. Pt aware. Daughter will be available to assist her.

## 2018-01-04 ENCOUNTER — Inpatient Hospital Stay (HOSPITAL_BASED_OUTPATIENT_CLINIC_OR_DEPARTMENT_OTHER): Payer: Medicare Other | Admitting: Hematology and Oncology

## 2018-01-04 ENCOUNTER — Other Ambulatory Visit: Payer: Self-pay | Admitting: Hematology and Oncology

## 2018-01-04 ENCOUNTER — Inpatient Hospital Stay: Payer: Medicare Other | Attending: Hematology and Oncology

## 2018-01-04 ENCOUNTER — Inpatient Hospital Stay: Payer: Medicare Other

## 2018-01-04 ENCOUNTER — Encounter: Payer: Self-pay | Admitting: Hematology and Oncology

## 2018-01-04 VITALS — BP 107/71 | HR 94 | Temp 97.8°F | Resp 18 | Wt 141.0 lb

## 2018-01-04 DIAGNOSIS — E538 Deficiency of other specified B group vitamins: Secondary | ICD-10-CM | POA: Insufficient documentation

## 2018-01-04 DIAGNOSIS — Z903 Acquired absence of stomach [part of]: Secondary | ICD-10-CM

## 2018-01-04 DIAGNOSIS — D509 Iron deficiency anemia, unspecified: Secondary | ICD-10-CM | POA: Diagnosis present

## 2018-01-04 DIAGNOSIS — D500 Iron deficiency anemia secondary to blood loss (chronic): Secondary | ICD-10-CM

## 2018-01-04 DIAGNOSIS — R110 Nausea: Secondary | ICD-10-CM

## 2018-01-04 LAB — FERRITIN: Ferritin: 71 ng/mL (ref 11–307)

## 2018-01-04 LAB — CBC WITH DIFFERENTIAL/PLATELET
Basophils Absolute: 0 10*3/uL (ref 0–0.1)
Basophils Relative: 0 %
Eosinophils Absolute: 0.5 10*3/uL (ref 0–0.7)
Eosinophils Relative: 6 %
HCT: 34.5 % — ABNORMAL LOW (ref 35.0–47.0)
Hemoglobin: 10.9 g/dL — ABNORMAL LOW (ref 12.0–16.0)
Lymphocytes Relative: 17 %
Lymphs Abs: 1.3 10*3/uL (ref 1.0–3.6)
MCH: 28.2 pg (ref 26.0–34.0)
MCHC: 31.6 g/dL — ABNORMAL LOW (ref 32.0–36.0)
MCV: 89.2 fL (ref 80.0–100.0)
Monocytes Absolute: 0.6 10*3/uL (ref 0.2–0.9)
Monocytes Relative: 8 %
Neutro Abs: 5.3 10*3/uL (ref 1.4–6.5)
Neutrophils Relative %: 69 %
Platelets: 350 10*3/uL (ref 150–440)
RBC: 3.87 MIL/uL (ref 3.80–5.20)
RDW: 20.2 % — ABNORMAL HIGH (ref 11.5–14.5)
WBC: 7.7 10*3/uL (ref 3.6–11.0)

## 2018-01-04 MED ORDER — CYANOCOBALAMIN 1000 MCG/ML IJ SOLN
1000.0000 ug | Freq: Once | INTRAMUSCULAR | Status: AC
  Administered 2018-01-04: 1000 ug via INTRAMUSCULAR
  Filled 2018-01-04: qty 1

## 2018-01-04 NOTE — Progress Notes (Signed)
Patient offers no complaints today. 

## 2018-01-04 NOTE — Progress Notes (Signed)
[facility] day:  01/04/2018   Chief Complaint: Jocelyn Case is a 72 y.o. female s/p partial gastrectomy and low iron who is seen for assessment after interval IV iron.  HPI: The patient was last seen in the hematology clinic on 11/23/2017.  She had iron deficiency anemia and was intolerant of oral iron.  She had previously received IV iron in [place] as well as B12 injections.  Labs revealed a hematocrit of 31, hemoglobin 9.6, MCV 84.7, platelets 379,000, WBC 5600 with an ANC of 3200.  Ferritin was 7.  B12 was 267.  Folate was 28.  She received Venofer weekly x 4 (11/30/2017 - 12/21/2017).  She received monthly B12 on 12/07/2017.  During the interim, patient has ben doing well overall. She states, "I at least I feel like I want to get out of bed now". Patient denies B symptoms or interval infections. Patient has continued reflux and nausea. Legs continue to be restless. Patient is eating well. She has gained 4 pounds. Patient denies any pain in the clinic today.   Patient's EGD has been rescheduled for June.    Past Medical History:  Diagnosis Date  . Anemia   . Blood transfusion without reported diagnosis   . GERD (gastroesophageal reflux disease)   . Restless leg syndrome 2015   R leg     Past Surgical History:  Procedure Laterality Date  . ABDOMINAL HYSTERECTOMY    . APPENDECTOMY    . CHOLECYSTECTOMY    . COLONOSCOPY  2 years ago  . STOMACH SURGERY  2013  . UPPER GASTROINTESTINAL ENDOSCOPY  05/2017   pt stated "bleeding ulcer and stopped up duodenum was noted"    Family History  Problem Relation Age of Onset  . Peptic Ulcer Disease Mother   . Dementia Mother   . COPD Father   . Colon cancer Father     Social History:  reports that she has never smoked. She has never used smokeless tobacco. She reports that she drinks alcohol. She reports that she does not use drugs.  She drinks wine in the evening.  She previously lived in  [place].  She moved to [place] in 05/2017.  Her daughter lives in [place].  She is temporarily living in an apartment in [place].  The patient is alone today.  Allergies: No Known Allergies  Current Medications: Current Outpatient Medications  Medication Sig Dispense Refill  . cholecalciferol (VITAMIN D) 400 units TABS tablet Take 2,000 Units by mouth daily.    . colestipol (COLESTID) 1 g tablet Take 1 g by mouth.     . cyanocobalamin (,VITAMIN B-12,) 1000 MCG/ML injection Inject 1,000 mcg into the muscle every 30 (thirty) days.    . folic acid (FOLVITE) 563 MCG tablet Take 400 mcg by mouth daily.     . metoCLOPramide (REGLAN) 10 MG tablet Take 10 mg by mouth 4 (four) times daily.    . metoCLOPramide (REGLAN) 5 MG tablet Take by mouth.    . mirtazapine (REMERON) 15 MG tablet Take 15 mg by mouth at bedtime.     . pantoprazole (PROTONIX) 40 MG tablet Take 1 tablet (40 mg total) by mouth daily. 90 tablet 3  . promethazine (PHENERGAN) 25 MG tablet Take by mouth.     Marland Kitchen rOPINIRole (REQUIP) 0.25 MG tablet Take 0.25 mg by mouth at bedtime.     . sucralfate (CARAFATE) 1 g tablet Take 1 g by mouth 4 (four) times  daily.      No current facility-administered medications for this visit.     Review of Systems:  GENERAL:  Feels "better I think".  No fevers or sweats.  Weight up 4 pounds.  PERFORMANCE STATUS (ECOG):  1 HEENT:  No visual changes, runny nose, sore throat, mouth sores or tenderness. Lungs: No shortness of breath or cough.  No hemoptysis. Cardiac:  No chest pain, palpitations, orthopnea, or PND. GI:  Reflux.  Some nausea. No vomiting, diarrhea, constipation, melena or hematochezia. GU:  No urgency, frequency, dysuria, or hematuria. Musculoskeletal:  No back pain.  No joint pain.  No muscle tenderness. Extremities:  Restless legs; chronic. No pain or swelling. Skin:  No rashes or skin changes. Neuro:  Restless leg.  No headache, numbness or weakness, balance or  coordination issues. Endocrine:  No diabetes, thyroid issues, hot flashes or night sweats. Psych:  No mood changes, depression or anxiety. Pain:  No focal pain. Review of systems:  All other systems reviewed and found to be negative.  Physical Exam: Blood pressure 107/71, pulse 94, temperature 97.8 F (36.6 C), temperature source Tympanic, resp. rate 18, weight 141 lb (64 kg), SpO2 93 %. GENERAL:  Well developed, well nourished,woman sitting comfortably in the exam room in no acute distress. MENTAL STATUS:  Alert and oriented to person, place and time. HEAD:  Short graying brown hair.  Normocephalic, atraumatic, face symmetric, no Cushingoid features. EYES:  Blue eyes.  Pupils equal round and reactive to light and accomodation.  No conjunctivitis or scleral icterus. ENT:  Hearing aide.  Oropharynx clear without lesion.  Tongue normal. Mucous membranes moist.  RESPIRATORY:  Clear to auscultation without rales, wheezes or rhonchi. CARDIOVASCULAR:  Regular rate and rhythm without murmur, rub or gallop. ABDOMEN:  Soft, non-tender, with active bowel sounds, and no hepatosplenomegaly.  No masses. SKIN:  No rashes, ulcers or lesions. EXTREMITIES: No edema, no skin discoloration or tenderness.  No palpable cords. LYMPH NODES: No palpable cervical, supraclavicular, axillary or inguinal adenopathy  NEUROLOGICAL: Unremarkable. PSYCH:  Appropriate.   Appointment on 01/04/2018  Component Date Value Ref Range Status  . WBC 01/04/2018 7.7  3.6 - 11.0 K/uL Final  . RBC 01/04/2018 3.87  3.80 - 5.20 MIL/uL Final  . Hemoglobin 01/04/2018 10.9* 12.0 - 16.0 g/dL Final  . HCT 01/04/2018 34.5* 35.0 - 47.0 % Final  . MCV 01/04/2018 89.2  80.0 - 100.0 fL Final  . MCH 01/04/2018 28.2  26.0 - 34.0 pg Final  . MCHC 01/04/2018 31.6* 32.0 - 36.0 g/dL Final  . RDW 01/04/2018 20.2* 11.5 - 14.5 % Final  . Platelets 01/04/2018 350  150 - 440 K/uL Final  . Neutrophils Relative % 01/04/2018 69  % Final  . Neutro Abs  01/04/2018 5.3  1.4 - 6.5 K/uL Final  . Lymphocytes Relative 01/04/2018 17  % Final  . Lymphs Abs 01/04/2018 1.3  1.0 - 3.6 K/uL Final  . Monocytes Relative 01/04/2018 8  % Final  . Monocytes Absolute 01/04/2018 0.6  0.2 - 0.9 K/uL Final  . Eosinophils Relative 01/04/2018 6  % Final  . Eosinophils Absolute 01/04/2018 0.5  0 - 0.7 K/uL Final  . Basophils Relative 01/04/2018 0  % Final  . Basophils Absolute 01/04/2018 0.0  0 - 0.1 K/uL Final   Performed at [facility], [address] [place], [place] [address]    Assessment:  Jocelyn Case is a 72 y.o. female s/p partial gastrectomy in 2009 and subsequent iron deficiency  anemia.  She previously received IV iron off/on every 6 months for the past 3 years while living in [place].  She is intolerant of oral iron.  Work-up on 11/23/2017 revealed a hematocrit of 31, hemoglobin 9.6, MCV 84.7, platelets 379,000, WBC 5600 with an ANC of 3200.  Ferritin was 7.  B12 was 267.  Folate was 28.  She received Venofer weekly x 4 (11/30/2017 - 12/21/2017).  Ferritin has been followed: 5 on 11/05/2017, 7 on 11/23/2017, and 71 on 01/04/2018.  She receives B12 IM monthly (last 12/07/2017).  She is on folic acid.  EGD in [place] in 05/2017 revealed retained food.  EGD after 2 days of fasting revealed an ulcer with some bleeding.  She is followed by Dr. [name].  Symptomatically, she feels better.  She rescheduled her EGD for sometime in 03/2018.  Exam is unremarkable.  Hemoglobin is 10.9.   Plan: 1.  Review initial labs. 2.  B12 today, then continue monthly B12  3.  Discuss diagnosis and management of iron deficiency s/p gastrectomy. Ferritin pending. Will call patient is she needs additional iron infusions. Goal > 30. 4.  Follow-up EGD in 03/2018. 5.  RTC in 6 weeks for labs (CBC with diff, ferritin). 6.  RTC in 3 months for MD assessment, labs (CBC with diff, ferritin- day before) and +/- Venofer.   Honor Loh, NP  01/04/2018, 11:38 AM    I saw and evaluated the patient, participating in the key portions of the service and reviewing pertinent diagnostic studies and records.  I reviewed the nurse practitioner's note and agree with the findings and the plan.  The assessment and plan were discussed with the patient. A few questions were asked by the patient and answered.   Nolon Stalls, MD 01/04/2018,11:38 AM

## 2018-01-07 ENCOUNTER — Telehealth: Payer: Self-pay | Admitting: *Deleted

## 2018-01-07 NOTE — Telephone Encounter (Signed)
Called patient to inform her that her ferritin is 71.  Patient does not need IV iron at this time.

## 2018-01-07 NOTE — Telephone Encounter (Signed)
-----   Message from Lequita Asal, MD sent at 01/05/2018  6:11 AM EDT ----- Regarding: Please call patient   Please call patient with ferritin (71).  No need for IV iron now.  M

## 2018-01-09 ENCOUNTER — Ambulatory Visit: Admission: RE | Admit: 2018-01-09 | Payer: Medicare Other | Source: Ambulatory Visit | Admitting: Gastroenterology

## 2018-01-09 ENCOUNTER — Encounter: Admission: RE | Payer: Self-pay | Source: Ambulatory Visit

## 2018-01-09 SURGERY — ESOPHAGOGASTRODUODENOSCOPY (EGD) WITH PROPOFOL
Anesthesia: General

## 2018-01-17 ENCOUNTER — Other Ambulatory Visit: Payer: Medicare Other

## 2018-01-18 ENCOUNTER — Ambulatory Visit: Payer: Medicare Other

## 2018-01-18 ENCOUNTER — Ambulatory Visit: Payer: Medicare Other | Admitting: Urgent Care

## 2018-02-06 ENCOUNTER — Encounter: Payer: Self-pay | Admitting: Gastroenterology

## 2018-02-06 ENCOUNTER — Ambulatory Visit (INDEPENDENT_AMBULATORY_CARE_PROVIDER_SITE_OTHER): Payer: Medicare Other | Admitting: Gastroenterology

## 2018-02-06 ENCOUNTER — Other Ambulatory Visit: Payer: Self-pay

## 2018-02-06 VITALS — BP 101/63 | HR 86 | Temp 98.2°F | Ht 62.0 in | Wt 135.4 lb

## 2018-02-06 DIAGNOSIS — Z903 Acquired absence of stomach [part of]: Secondary | ICD-10-CM

## 2018-02-06 DIAGNOSIS — R112 Nausea with vomiting, unspecified: Secondary | ICD-10-CM

## 2018-02-06 NOTE — Progress Notes (Addendum)
[name], MD [address]  [place]  [place], [place] [address]  Main: [phone]  Fax: [phone]   Primary Care Physician: [name], MD  Primary Gastroenterologist:  Dr. [name]  Chief complaint: Follow-up of history of gastric ulcers, gastrectomy, iron deficiency anemia   HPI: Jocelyn Case is a 72 y.o. female initially seen on August 31, 2017 due to previous history of gastric ulcer and gastrectomy 10 years ago in [place] here for follow-up.  Patient is now on IV iron replacement and following hematology for this and getting this as needed.  Last ferritin was 71.  Patient reports improved energy level with IV iron transfusions.  However, reports intermittent nausea and vomiting.  No hematemesis.  States most of vomiting occurs when she eats more fiber products like solids.  No blood in stool.  No altered bowel habits.  See scanned records obtained under media, November 29, 2017 scanned documentation.  As per notes, patient underwent endoscopy in August 2018 "revealing tight opening at the prior surgical area, mucosal ulceration and friable mucosa was detected, biopsy completed."  It appears that the initial EGD could not be completed due to food in the stomach, and had to be repeated after 2 days of fasting.  Last colonoscopy date: 2015  Actual colonoscopy and EGD report not available.  Above records obtained from clinic notes sent over from [place].  MRCP from February 2008: " Stable MRI of the abdomen and MRCP of the abdomen.  There has been no change since prior examination from November 2007. No evidence of intra-or extrahepatic biliary duct dilation.  No retained stone.  Pancreatic duct appears within normal limits."   Current Outpatient Medications  Medication Sig Dispense Refill  . cholecalciferol (VITAMIN D) 400 units TABS tablet Take 2,000 Units by mouth daily.    . colestipol (COLESTID) 1 g tablet Take 1 g by mouth.     .  cyanocobalamin (,VITAMIN B-12,) 1000 MCG/ML injection Inject 1,000 mcg into the muscle every 30 (thirty) days.    . folic acid (FOLVITE) 702 MCG tablet Take 400 mcg by mouth daily.     . metoCLOPramide (REGLAN) 10 MG tablet Take 10 mg by mouth 4 (four) times daily.    . metoCLOPramide (REGLAN) 5 MG tablet Take by mouth.    . mirtazapine (REMERON) 15 MG tablet Take 15 mg by mouth at bedtime.     . pantoprazole (PROTONIX) 40 MG tablet Take 1 tablet (40 mg total) by mouth daily. 90 tablet 3  . promethazine (PHENERGAN) 25 MG tablet Take by mouth.     Marland Kitchen rOPINIRole (REQUIP) 0.25 MG tablet Take 0.25 mg by mouth at bedtime.     . sucralfate (CARAFATE) 1 g tablet Take 1 g by mouth 4 (four) times daily.      No current facility-administered medications for this visit.     Allergies as of 02/06/2018  . (No Known Allergies)    ROS:  General: Negative for anorexia, weight loss, fever, chills, fatigue, weakness. ENT: Negative for hoarseness, difficulty swallowing , nasal congestion. CV: Negative for chest pain, angina, palpitations, dyspnea on exertion, peripheral edema.  Respiratory: Negative for dyspnea at rest, dyspnea on exertion, cough, sputum, wheezing.  GI: See history of present illness. GU:  Negative for dysuria, hematuria, urinary incontinence, urinary frequency, nocturnal urination.  Endo: Negative for unusual weight change.    Physical Examination:    Vitals:   02/06/18 1343  BP: 101/63  Pulse: 86  Temp:  98.2 F (36.8 C)  TempSrc: Oral  Weight: 135 lb 6.4 oz (61.4 kg)  Height: 5\' 2"  (1.575 m)    General: Well-nourished, well-developed in no acute distress.  Eyes: No icterus. Conjunctivae pink. Mouth: Oropharyngeal mucosa moist and pink , no lesions erythema or exudate. Neck: Supple, Trachea midline Abdomen: Bowel sounds are normal, nontender, nondistended, no hepatosplenomegaly or masses, no abdominal bruits or hernia , no rebound or guarding.   Extremities: No lower  extremity edema. No clubbing or deformities. Neuro: Alert and oriented x 3.  Grossly intact. Skin: Warm and dry, no jaundice.   Psych: Alert and cooperative, normal mood and affect.   Labs: CMP     Component Value Date/Time   NA 137 11/05/2017 1141   K 3.8 11/05/2017 1141   CL 105 11/05/2017 1141   CO2 23 11/05/2017 1141   GLUCOSE 97 11/05/2017 1141   BUN 19 11/05/2017 1141   CREATININE 0.57 11/05/2017 1141   CALCIUM 8.3 (L) 11/05/2017 1141   PROT 5.5 (L) 11/05/2017 1141   ALBUMIN 2.7 (L) 11/05/2017 1141   AST 25 11/05/2017 1141   ALT 19 11/05/2017 1141   ALKPHOS 55 11/05/2017 1141   BILITOT 0.4 11/05/2017 1141   GFRNONAA >60 11/05/2017 1141   GFRAA >60 11/05/2017 1141   Lab Results  Component Value Date   WBC 7.7 01/04/2018   HGB 10.9 (L) 01/04/2018   HCT 34.5 (L) 01/04/2018   MCV 89.2 01/04/2018   PLT 350 01/04/2018    Imaging Studies: No results found.  Assessment and Plan:   Jocelyn Case is a 72 y.o. y/o female with history of gastric ulcers and gastrectomy over 10 years ago, recently moved here from [place], and presents for follow-up  Last EGD August 2018 in [place], that reported tight opening at the site of previous surgery with very friable mucosa and ulceration  We will proceed with EGD, to evaluate source of symptoms, and evaluate above findings from previous EGD Continue PPI Continue to avoid NSAIDs Continue sucralfate  Once EGD is complete, and nausea vomiting resolved, and patient able to tolerate prep, can discuss timing of repeat colonoscopy, his last one was in 2015.  Again colonoscopy report is not available.  However, patient denies any family history of colon cancer or blood in stool or altered bowel habits.  No indication for urgent colonoscopy at this time.  ADDENDUM: See scanned [facility] clinic letter. 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   Their note also states "she does have evidence of bacterial overgrowth and culture and this probably explains a steatorrhea documented on fecal fat testing."  Dr [name]

## 2018-02-06 NOTE — Addendum Note (Signed)
Addended by: Earl Lagos on: 02/06/2018 03:45 PM   Modules accepted: Orders

## 2018-02-08 ENCOUNTER — Inpatient Hospital Stay: Payer: Medicare Other | Attending: Hematology and Oncology

## 2018-02-08 DIAGNOSIS — E538 Deficiency of other specified B group vitamins: Secondary | ICD-10-CM | POA: Diagnosis present

## 2018-02-08 DIAGNOSIS — D509 Iron deficiency anemia, unspecified: Secondary | ICD-10-CM | POA: Insufficient documentation

## 2018-02-08 DIAGNOSIS — D500 Iron deficiency anemia secondary to blood loss (chronic): Secondary | ICD-10-CM

## 2018-02-08 MED ORDER — CYANOCOBALAMIN 1000 MCG/ML IJ SOLN
1000.0000 ug | Freq: Once | INTRAMUSCULAR | Status: AC
  Administered 2018-02-08: 1000 ug via INTRAMUSCULAR

## 2018-02-13 ENCOUNTER — Encounter: Payer: Self-pay | Admitting: *Deleted

## 2018-02-14 ENCOUNTER — Ambulatory Visit
Admission: RE | Admit: 2018-02-14 | Discharge: 2018-02-14 | Disposition: A | Discharge status: Home / Self Care | Payer: Medicare Other | Source: Ambulatory Visit | Attending: Gastroenterology | Admitting: Gastroenterology

## 2018-02-14 ENCOUNTER — Ambulatory Visit: Payer: Medicare Other | Admitting: Anesthesiology

## 2018-02-14 ENCOUNTER — Encounter
Admission: RE | Disposition: A | Discharge status: Home / Self Care | Payer: Self-pay | Source: Ambulatory Visit | Attending: Gastroenterology

## 2018-02-14 ENCOUNTER — Encounter: Payer: Self-pay | Admitting: *Deleted

## 2018-02-14 DIAGNOSIS — K219 Gastro-esophageal reflux disease without esophagitis: Secondary | ICD-10-CM | POA: Diagnosis not present

## 2018-02-14 DIAGNOSIS — K3189 Other diseases of stomach and duodenum: Secondary | ICD-10-CM | POA: Insufficient documentation

## 2018-02-14 DIAGNOSIS — K6389 Other specified diseases of intestine: Secondary | ICD-10-CM | POA: Diagnosis not present

## 2018-02-14 DIAGNOSIS — Z980 Intestinal bypass and anastomosis status: Secondary | ICD-10-CM | POA: Diagnosis not present

## 2018-02-14 DIAGNOSIS — D649 Anemia, unspecified: Secondary | ICD-10-CM | POA: Insufficient documentation

## 2018-02-14 DIAGNOSIS — Z90710 Acquired absence of both cervix and uterus: Secondary | ICD-10-CM | POA: Diagnosis not present

## 2018-02-14 DIAGNOSIS — R51 Headache: Secondary | ICD-10-CM | POA: Insufficient documentation

## 2018-02-14 DIAGNOSIS — Z79899 Other long term (current) drug therapy: Secondary | ICD-10-CM | POA: Insufficient documentation

## 2018-02-14 DIAGNOSIS — K259 Gastric ulcer, unspecified as acute or chronic, without hemorrhage or perforation: Secondary | ICD-10-CM | POA: Diagnosis not present

## 2018-02-14 DIAGNOSIS — G4730 Sleep apnea, unspecified: Secondary | ICD-10-CM | POA: Insufficient documentation

## 2018-02-14 DIAGNOSIS — Z8711 Personal history of peptic ulcer disease: Secondary | ICD-10-CM

## 2018-02-14 DIAGNOSIS — K2100 Gastro-esophageal reflux disease with esophagitis, without bleeding: Secondary | ICD-10-CM

## 2018-02-14 DIAGNOSIS — R12 Heartburn: Secondary | ICD-10-CM | POA: Diagnosis not present

## 2018-02-14 DIAGNOSIS — R1310 Dysphagia, unspecified: Secondary | ICD-10-CM | POA: Insufficient documentation

## 2018-02-14 DIAGNOSIS — Z9049 Acquired absence of other specified parts of digestive tract: Secondary | ICD-10-CM | POA: Diagnosis not present

## 2018-02-14 DIAGNOSIS — G2581 Restless legs syndrome: Secondary | ICD-10-CM | POA: Diagnosis not present

## 2018-02-14 DIAGNOSIS — Z903 Acquired absence of stomach [part of]: Secondary | ICD-10-CM

## 2018-02-14 DIAGNOSIS — R112 Nausea with vomiting, unspecified: Secondary | ICD-10-CM

## 2018-02-14 HISTORY — PX: ESOPHAGOGASTRODUODENOSCOPY (EGD) WITH PROPOFOL: SHX5813

## 2018-02-14 HISTORY — DX: Sleep apnea, unspecified: G47.30

## 2018-02-14 SURGERY — ESOPHAGOGASTRODUODENOSCOPY (EGD) WITH PROPOFOL
Anesthesia: General

## 2018-02-14 MED ORDER — PROPOFOL 500 MG/50ML IV EMUL
INTRAVENOUS | Status: AC
  Filled 2018-02-14: qty 50

## 2018-02-14 MED ORDER — ONDANSETRON HCL 4 MG/2ML IJ SOLN
INTRAMUSCULAR | Status: DC | PRN
  Administered 2018-02-14: 4 mg via INTRAVENOUS

## 2018-02-14 MED ORDER — SODIUM CHLORIDE 0.9 % IV SOLN
INTRAVENOUS | Status: DC
  Administered 2018-02-14: 08:00:00 via INTRAVENOUS

## 2018-02-14 MED ORDER — LIDOCAINE HCL (CARDIAC) PF 100 MG/5ML IV SOSY
PREFILLED_SYRINGE | INTRAVENOUS | Status: DC | PRN
  Administered 2018-02-14: 30 mg via INTRAVENOUS

## 2018-02-14 MED ORDER — PROPOFOL 500 MG/50ML IV EMUL
INTRAVENOUS | Status: DC | PRN
  Administered 2018-02-14: 75 ug/kg/min via INTRAVENOUS

## 2018-02-14 MED ORDER — PROPOFOL 10 MG/ML IV BOLUS
INTRAVENOUS | Status: DC | PRN
  Administered 2018-02-14: 30 mg via INTRAVENOUS
  Administered 2018-02-14: 50 mg via INTRAVENOUS

## 2018-02-14 NOTE — Anesthesia Post-op Follow-up Note (Signed)
Anesthesia QCDR form completed.        

## 2018-02-14 NOTE — Progress Notes (Signed)
Procedure aborted by Dr.Tahiliani due to large amount of food in stomach.

## 2018-02-14 NOTE — Anesthesia Preprocedure Evaluation (Signed)
Anesthesia Evaluation  Patient identified by MRN, date of birth, ID band Patient awake    Reviewed: Allergy & Precautions, H&P , NPO status , Patient's Chart, lab work & pertinent test results  History of Anesthesia Complications Negative for: history of anesthetic complications  Airway Mallampati: III  TM Distance: <3 FB Neck ROM: limited    Dental  (+) Chipped, Poor Dentition   Pulmonary sleep apnea ,           Cardiovascular Exercise Tolerance: Good (-) angina(-) Past MI and (-) DOE negative cardio ROS       Neuro/Psych  Headaches, negative psych ROS   GI/Hepatic Neg liver ROS, GERD  Medicated and Controlled,  Endo/Other  negative endocrine ROS  Renal/GU negative Renal ROS  negative genitourinary   Musculoskeletal   Abdominal   Peds  Hematology negative hematology ROS (+)   Anesthesia Other Findings Past Medical History: No date: Anemia No date: Blood transfusion without reported diagnosis No date: GERD (gastroesophageal reflux disease) 2015: Restless leg syndrome     Comment:  R leg   Past Surgical History: No date: ABDOMINAL HYSTERECTOMY No date: APPENDECTOMY No date: CHOLECYSTECTOMY 2 years ago: COLONOSCOPY 2013: STOMACH SURGERY 05/2017: UPPER GASTROINTESTINAL ENDOSCOPY     Comment:  pt stated "bleeding ulcer and stopped up duodenum was               noted"  BMI    Body Mass Index:  24.69 kg/m      Reproductive/Obstetrics negative OB ROS                             Anesthesia Physical Anesthesia Plan  ASA: III  Anesthesia Plan: General   Post-op Pain Management:    Induction: Intravenous  PONV Risk Score and Plan: Propofol infusion and TIVA  Airway Management Planned: Natural Airway and Nasal Cannula  Additional Equipment:   Intra-op Plan:   Post-operative Plan:   Informed Consent: I have reviewed the patients History and Physical, chart, labs and  discussed the procedure including the risks, benefits and alternatives for the proposed anesthesia with the patient or authorized representative who has indicated his/her understanding and acceptance.   Dental Advisory Given  Plan Discussed with: Anesthesiologist, CRNA and Surgeon  Anesthesia Plan Comments: (Patient consented for risks of anesthesia including but not limited to:  - adverse reactions to medications - risk of intubation if required - damage to teeth, lips or other oral mucosa - sore throat or hoarseness - Damage to heart, brain, lungs or loss of life  Patient voiced understanding.)        Anesthesia Quick Evaluation

## 2018-02-14 NOTE — Op Note (Signed)
[facility] Gastroenterology Patient Name: Jocelyn Case Procedure Date: 02/14/2018 8:04 AM MRN: [id number] Account #: [id number] Date of Birth: Feb 25, 1946 Admit Type: Outpatient Age: 72 Room: [facility] ENDO ROOM 2 Gender: Female Note Status: Finalized Procedure:            Upper GI endoscopy Indications:          Nausea with vomiting, Personal history of gastrectomy,                        Personal history of peptic ulcer disease Providers:            Kyrsten Deleeuw B. Bonna Gains MD, MD Referring MD:         Ocie Cornfield. Ouida Sills MD, MD (Referring MD) Medicines:            Monitored Anesthesia Care Complications:        No immediate complications. Procedure:            Pre-Anesthesia Assessment:                       - The risks and benefits of the procedure and the                        sedation options and risks were discussed with the                        patient. All questions were answered and informed                        consent was obtained.                       - Patient identification and proposed procedure were                        verified prior to the procedure.                       - ASA Grade Assessment: III - A patient with severe                        systemic disease.                       After obtaining informed consent, the endoscope was                        passed under direct vision. Throughout the procedure,                        the patient's blood pressure, pulse, and oxygen                        saturations were monitored continuously. The Endoscope                        was introduced through the mouth, with the intention of                        advancing to the stomach. The scope was advanced to the  gastric body before the procedure was aborted.                        Medications were given. The upper GI endoscopy was                        accomplished with ease. The patient tolerated the    procedure well. Findings:      LA Grade B (one or more mucosal breaks greater than 5 mm, not extending       between the tops of two mucosal folds) esophagitis with no bleeding was       found in the distal esophagus.      A large amount of food (residue) was found in the entire examined       stomach. Due to large amount of food in the stomach, the procedure was       aborted to prevent aspiration. After scope was withdrawn, sedation was       stopped, and patient was awake she vomited food contents. No       hematemesis. She did not aspirate or have hypoxia at any point during or       after the procedure, and vitals stayed stable throughout. Impression:           - LA Grade B reflux esophagitis.                       - A large amount of food (residue) in the stomach.                       - No specimens collected. Recommendation:       - Repeat upper endoscopy tomorrow.                       - Only clear liquid diet today, until 8 PM and then NPO                        after 8 PM.                       - Follow an antireflux regimen.                       - Continue present medications.                       - Return to my office as previously scheduled.                       - Return to primary care physician as previously                        scheduled.                       - The findings and recommendations were discussed with                        the patient. Patient was completely awake, alert and                        oriented after the procedure with clear lung  auscultation and Normal respiratory effort and                        verbalized understanding of the entire discussion. She                        was not any any distress or have shortness of breath on                        post-procedure examination.                       - The findings and recommendations were discussed with                        the patient's family. 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 All rights reserved. The codes documented in this report are preliminary and upon coder review may  be revised to meet current compliance requirements.  [name], MD Margretta Sidle B. Bonna Gains MD, MD 02/14/2018 8:26:37 AM This report has been signed electronically. Number of Addenda: 0 Note Initiated On: 02/14/2018 8:04 AM      [facility]

## 2018-02-14 NOTE — Anesthesia Postprocedure Evaluation (Signed)
Anesthesia Post Note  Patient: Abigale Dorow  Procedure(s) Performed: ESOPHAGOGASTRODUODENOSCOPY (EGD) WITH PROPOFOL (N/A )  Patient location during evaluation: Endoscopy Anesthesia Type: General Level of consciousness: awake and alert Pain management: pain level controlled Vital Signs Assessment: post-procedure vital signs reviewed and stable Respiratory status: spontaneous breathing, nonlabored ventilation, respiratory function stable and patient connected to nasal cannula oxygen Cardiovascular status: blood pressure returned to baseline and stable Postop Assessment: no apparent nausea or vomiting Comments: Patient reported being NPO appropriate.  Food in stomach seen at start of upper endoscopy, stomach suctioned, procedure aborted and patient emerged.  Patient vomited post procedure but was able to protect airway.  In PACU reports having full stomach in the past for upper endoscopies even when she is NPO appropriate.  Lungs clear, no SOB and patient saturating 95 % on room air.  Patient to return tomorrow for repeat of procedure.  Patient and family member instructed to contact a doctor if patient has any new shortness of breath or if they have any other concerns.     Last Vitals:  Vitals:   02/14/18 0834 02/14/18 0854  BP:  130/64  Pulse:    Resp: (!) 25 18  Temp:    SpO2:      Last Pain:  Vitals:   02/14/18 0844  TempSrc:   PainSc: 0-No pain                 Precious Haws Piscitello

## 2018-02-14 NOTE — Transfer of Care (Signed)
Immediate Anesthesia Transfer of Care Note  Patient: Jocelyn Case  Procedure(s) Performed: ESOPHAGOGASTRODUODENOSCOPY (EGD) WITH PROPOFOL (N/A )  Patient Location: PACU and Endoscopy Unit  Anesthesia Type:General  Level of Consciousness: awake, alert  and oriented  Airway & Oxygen Therapy: Patient Spontanous Breathing and Patient connected to nasal cannula oxygen  Post-op Assessment: Report given to RN and Post -op Vital signs reviewed and stable  Post vital signs: Reviewed and stable  Last Vitals:  Vitals Value Taken Time  BP 127/61 02/14/2018  8:25 AM  Temp 36.6 C 02/14/2018  8:24 AM  Pulse 96 02/14/2018  8:27 AM  Resp 22 02/14/2018  8:27 AM  SpO2 89 % 02/14/2018  8:27 AM  Vitals shown include unvalidated device data.  Last Pain:  Vitals:   02/14/18 0824  TempSrc: Tympanic  PainSc: 0-No pain         Complications: No apparent anesthesia complications

## 2018-02-14 NOTE — H&P (Signed)
Jocelyn Antigua, MD [address] [place], [place], [place], [address] [address], [place], [place], [place], [address] Phone: [phone]  Fax: [phone]  Primary Care Physician:  [name], MD   Pre-Procedure History & Physical: HPI:  Jocelyn Case is a 72 y.o. female is here for an EGD.   Past Medical History:  Diagnosis Date  . Anemia   . Blood transfusion without reported diagnosis   . GERD (gastroesophageal reflux disease)   . Restless leg syndrome 2015   R leg   . Sleep apnea     Past Surgical History:  Procedure Laterality Date  . ABDOMINAL HYSTERECTOMY    . APPENDECTOMY    . CHOLECYSTECTOMY    . COLONOSCOPY  2 years ago  . STOMACH SURGERY  2013  . UPPER GASTROINTESTINAL ENDOSCOPY  05/2017   pt stated "bleeding ulcer and stopped up duodenum was noted"    Prior to Admission medications   Medication Sig Start Date End Date Taking? Authorizing Provider  cholecalciferol (VITAMIN D) 400 units TABS tablet Take 2,000 Units by mouth daily.   Yes [provider]  colestipol (COLESTID) 1 g tablet Take 1 g by mouth.  07/31/17 07/31/18 Yes [provider]  folic acid (FOLVITE) 540 MCG tablet Take 400 mcg by mouth daily.  07/31/17  Yes [provider]  metoCLOPramide (REGLAN) 5 MG tablet Take 5 mg by mouth 2 (two) times daily.    Yes [provider]  mirtazapine (REMERON) 15 MG tablet Take 15 mg by mouth at bedtime.  07/31/17 07/31/18 Yes [provider]  pantoprazole (PROTONIX) 40 MG tablet Take 1 tablet (40 mg total) by mouth daily. 12/24/17 12/24/18 Yes Lavergne Hiltunen, Margretta Sidle B, MD  rOPINIRole (REQUIP) 0.25 MG tablet Take 0.25 mg by mouth at bedtime.  07/31/17 07/31/18 Yes [provider]  sucralfate (CARAFATE) 1 g tablet Take 1 g by mouth 4 (four) times daily.  07/31/17 07/31/18 Yes [provider]  cyanocobalamin (,VITAMIN B-12,) 1000 MCG/ML injection Inject 1,000 mcg into the muscle every 30  (thirty) days.    [provider]  metoCLOPramide (REGLAN) 5 MG tablet Take by mouth. 07/31/17 01/04/18  [provider]  promethazine (PHENERGAN) 25 MG tablet Take by mouth.  07/31/17 01/04/18  [provider]    Allergies as of 02/06/2018  . (No Known Allergies)    Family History  Problem Relation Age of Onset  . Peptic Ulcer Disease Mother   . Dementia Mother   . COPD Father   . Colon cancer Father     Social History   Socioeconomic History  . Marital status: Widowed    Spouse name: Not on file  . Number of children: Not on file  . Years of education: Not on file  . Highest education level: Not on file  Occupational History  . Not on file  Social Needs  . Financial resource strain: Not on file  . Food insecurity:    Worry: Not on file    Inability: Not on file  . Transportation needs:    Medical: Not on file    Non-medical: Not on file  Tobacco Use  . Smoking status: Never Smoker  . Smokeless tobacco: Never Used  Substance and Sexual Activity  . Alcohol use: Yes    Comment: drinks wine with dinner  . Drug use: No  . Sexual activity: Never  Lifestyle  . Physical activity:    Days per week: Not on file  Minutes per session: Not on file  . Stress: Not on file  Relationships  . Social connections:    Talks on phone: Not on file    Gets together: Not on file    Attends religious service: Not on file    Active member of club or organization: Not on file    Attends meetings of clubs or organizations: Not on file    Relationship status: Not on file  . Intimate partner violence:    Fear of current or ex partner: Not on file    Emotionally abused: Not on file    Physically abused: Not on file    Forced sexual activity: Not on file  Other Topics Concern  . Not on file  Social History Narrative  . Not on file    Review of Systems: See HPI, otherwise negative ROS  Physical Exam: BP (!) 133/56   Pulse 97   Temp (!) 97.2 F (36.2  C) (Tympanic)   Resp 14   Ht 5\' 2"  (1.575 m)   Wt 135 lb (61.2 kg)   SpO2 95%   BMI 24.69 kg/m  General:   Alert,  pleasant and cooperative in NAD Head:  Normocephalic and atraumatic. Neck:  Supple; no masses or thyromegaly. Lungs:  Clear throughout to auscultation, normal respiratory effort.    Heart:  +S1, +S2, Regular rate and rhythm, No edema. Abdomen:  Soft, nontender and nondistended. Normal bowel sounds, without guarding, and without rebound.   Neurologic:  Alert and  oriented x4;  grossly normal neurologically.  Impression/Plan: [name] is here for an EGD for Nausea/vomiting, previous gastric ulcers and gastrectomy  Risks, benefits, limitations, and alternatives regarding the procedure have been reviewed with the patient.  Questions have been answered.  All parties agreeable.   Virgel Manifold, MD  02/14/2018, 8:01 AM

## 2018-02-15 ENCOUNTER — Inpatient Hospital Stay: Payer: Medicare Other

## 2018-02-15 ENCOUNTER — Telehealth: Payer: Self-pay | Admitting: Gastroenterology

## 2018-02-15 ENCOUNTER — Ambulatory Visit: Payer: Medicare Other | Admitting: Anesthesiology

## 2018-02-15 ENCOUNTER — Encounter: Payer: Self-pay | Admitting: *Deleted

## 2018-02-15 ENCOUNTER — Ambulatory Visit
Admission: RE | Admit: 2018-02-15 | Discharge: 2018-02-15 | Disposition: A | Discharge status: Home / Self Care | Payer: Medicare Other | Source: Ambulatory Visit | Attending: Gastroenterology | Admitting: Gastroenterology

## 2018-02-15 ENCOUNTER — Encounter
Admission: RE | Disposition: A | Discharge status: Home / Self Care | Payer: Self-pay | Source: Ambulatory Visit | Attending: Gastroenterology

## 2018-02-15 DIAGNOSIS — D1339 Benign neoplasm of other parts of small intestine: Secondary | ICD-10-CM

## 2018-02-15 DIAGNOSIS — Z903 Acquired absence of stomach [part of]: Secondary | ICD-10-CM | POA: Diagnosis not present

## 2018-02-15 DIAGNOSIS — Z9049 Acquired absence of other specified parts of digestive tract: Secondary | ICD-10-CM | POA: Insufficient documentation

## 2018-02-15 DIAGNOSIS — R1310 Dysphagia, unspecified: Secondary | ICD-10-CM | POA: Diagnosis present

## 2018-02-15 DIAGNOSIS — Z980 Intestinal bypass and anastomosis status: Secondary | ICD-10-CM | POA: Diagnosis not present

## 2018-02-15 DIAGNOSIS — K259 Gastric ulcer, unspecified as acute or chronic, without hemorrhage or perforation: Secondary | ICD-10-CM | POA: Diagnosis not present

## 2018-02-15 DIAGNOSIS — R112 Nausea with vomiting, unspecified: Secondary | ICD-10-CM | POA: Diagnosis not present

## 2018-02-15 DIAGNOSIS — Z79899 Other long term (current) drug therapy: Secondary | ICD-10-CM | POA: Diagnosis not present

## 2018-02-15 DIAGNOSIS — Y832 Surgical operation with anastomosis, bypass or graft as the cause of abnormal reaction of the patient, or of later complication, without mention of misadventure at the time of the procedure: Secondary | ICD-10-CM | POA: Diagnosis not present

## 2018-02-15 DIAGNOSIS — R12 Heartburn: Secondary | ICD-10-CM | POA: Diagnosis not present

## 2018-02-15 DIAGNOSIS — K6389 Other specified diseases of intestine: Secondary | ICD-10-CM

## 2018-02-15 DIAGNOSIS — K9189 Other postprocedural complications and disorders of digestive system: Secondary | ICD-10-CM | POA: Insufficient documentation

## 2018-02-15 DIAGNOSIS — E538 Deficiency of other specified B group vitamins: Secondary | ICD-10-CM | POA: Diagnosis not present

## 2018-02-15 DIAGNOSIS — R1319 Other dysphagia: Secondary | ICD-10-CM

## 2018-02-15 DIAGNOSIS — G4730 Sleep apnea, unspecified: Secondary | ICD-10-CM | POA: Insufficient documentation

## 2018-02-15 DIAGNOSIS — K219 Gastro-esophageal reflux disease without esophagitis: Secondary | ICD-10-CM | POA: Insufficient documentation

## 2018-02-15 DIAGNOSIS — G2581 Restless legs syndrome: Secondary | ICD-10-CM | POA: Diagnosis not present

## 2018-02-15 DIAGNOSIS — D500 Iron deficiency anemia secondary to blood loss (chronic): Secondary | ICD-10-CM

## 2018-02-15 HISTORY — DX: Other complications of anesthesia, initial encounter: T88.59XA

## 2018-02-15 HISTORY — DX: Nausea with vomiting, unspecified: R11.2

## 2018-02-15 HISTORY — DX: Adverse effect of unspecified anesthetic, initial encounter: T41.45XA

## 2018-02-15 HISTORY — DX: Other specified postprocedural states: Z98.890

## 2018-02-15 HISTORY — PX: ESOPHAGOGASTRODUODENOSCOPY (EGD) WITH PROPOFOL: SHX5813

## 2018-02-15 LAB — CBC WITH DIFFERENTIAL/PLATELET
Basophils Absolute: 0 10*3/uL (ref 0–0.1)
Basophils Relative: 1 %
Eosinophils Absolute: 0.3 10*3/uL (ref 0–0.7)
Eosinophils Relative: 6 %
HCT: 33.4 % — ABNORMAL LOW (ref 35.0–47.0)
Hemoglobin: 10.6 g/dL — ABNORMAL LOW (ref 12.0–16.0)
Lymphocytes Relative: 20 %
Lymphs Abs: 0.9 10*3/uL — ABNORMAL LOW (ref 1.0–3.6)
MCH: 28.7 pg (ref 26.0–34.0)
MCHC: 31.8 g/dL — ABNORMAL LOW (ref 32.0–36.0)
MCV: 90.1 fL (ref 80.0–100.0)
Monocytes Absolute: 0.5 10*3/uL (ref 0.2–0.9)
Monocytes Relative: 10 %
Neutro Abs: 2.9 10*3/uL (ref 1.4–6.5)
Neutrophils Relative %: 63 %
Platelets: 339 10*3/uL (ref 150–440)
RBC: 3.7 MIL/uL — ABNORMAL LOW (ref 3.80–5.20)
RDW: 15.5 % — ABNORMAL HIGH (ref 11.5–14.5)
WBC: 4.5 10*3/uL (ref 3.6–11.0)

## 2018-02-15 LAB — FERRITIN: Ferritin: 45 ng/mL (ref 11–307)

## 2018-02-15 SURGERY — ESOPHAGOGASTRODUODENOSCOPY (EGD) WITH PROPOFOL
Anesthesia: General

## 2018-02-15 MED ORDER — PROPOFOL 500 MG/50ML IV EMUL
INTRAVENOUS | Status: DC | PRN
  Administered 2018-02-15: 150 ug/kg/min via INTRAVENOUS

## 2018-02-15 MED ORDER — PANTOPRAZOLE SODIUM 40 MG PO TBEC: 40.0000 mg | DELAYED_RELEASE_TABLET | Freq: Two times a day (BID) | ORAL | 1 refills | Status: DC

## 2018-02-15 MED ORDER — SODIUM CHLORIDE 0.9 % IV SOLN
INTRAVENOUS | Status: DC
  Administered 2018-02-15: 09:00:00 via INTRAVENOUS

## 2018-02-15 MED ORDER — PROPOFOL 10 MG/ML IV BOLUS
INTRAVENOUS | Status: DC | PRN
  Administered 2018-02-15: 60 mg via INTRAVENOUS

## 2018-02-15 MED ORDER — LIDOCAINE HCL (PF) 2 % IJ SOLN
INTRAMUSCULAR | Status: DC | PRN
  Administered 2018-02-15: 100 mg via INTRADERMAL

## 2018-02-15 MED ORDER — SUCRALFATE 1 G PO TABS: 1.0000 g | ORAL_TABLET | Freq: Four times a day (QID) | ORAL | 1 refills | Status: DC

## 2018-02-15 MED ORDER — ONDANSETRON HCL 4 MG/2ML IJ SOLN
INTRAMUSCULAR | Status: DC | PRN
  Administered 2018-02-15: 4 mg via INTRAVENOUS

## 2018-02-15 NOTE — Op Note (Signed)
[facility] Gastroenterology Patient Name: Jocelyn Case Procedure Date: 02/15/2018 9:48 AM MRN: [id number] Account #: [id number] Date of Birth: Sep 25, 1946 Admit Type: Outpatient Age: 72 Room: [facility] ENDO ROOM 2 Gender: Female Note Status: Finalized Procedure:            Upper GI endoscopy Indications:          Dysphagia, Heartburn, Nausea with vomiting, Personal                        history of gastrectomy Providers:            Milan Perkins B. Bonna Gains MD, MD Referring MD:         Ocie Cornfield. Ouida Sills MD, MD (Referring MD) Medicines:            Monitored Anesthesia Care Complications:        No immediate complications. Procedure:            Pre-Anesthesia Assessment:                       - The risks and benefits of the procedure and the                        sedation options and risks were discussed with the                        patient. All questions were answered and informed                        consent was obtained.                       - Patient identification and proposed procedure were                        verified prior to the procedure.                       - ASA Grade Assessment: III - A patient with severe                        systemic disease.                       After obtaining informed consent, the endoscope was                        passed under direct vision. Throughout the procedure,                        the patient's blood pressure, pulse, and oxygen                        saturations were monitored continuously. The Endoscope                        was introduced through the mouth, and advanced to the                        jejunum. The upper GI endoscopy was accomplished with  ease. The patient tolerated the procedure well. Findings:      The examined esophagus was normal. 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      Retained fluid was found in the entire examined stomach. Removed       immediately after entering the stomach. No residual fluid or food left       after completely suctioning it.      Few non-bleeding superficial gastric ulcers with no stigmata of bleeding       were found at the anastomosis. The largest lesion was 5 mm in largest       dimension. Biopsies were taken with a cold forceps for histology.      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 This was       characterized by mild stenosis and ulceration. A TTS dilator was passed       through the scope. Dilation with a 12-13.5-15 mm balloon dilator was       performed to 13.5 mm. The dilation site was examined and showed good       heme effect.      A 2 to 4 mm sessile polyp with no bleeding was found in the jejunum.       Biopsies were taken with a cold forceps for histology.      Exam of the jejunum was otherwise normal.      Both limbs of the Bilroth II anatomy were intubated and appeared normal       with wide openings and no narrowing. Impression:           - Normal esophagus. Biopsied. GE Junction at 36 cm                       - Retained gastric fluid.                       - Non-bleeding gastric ulcers at the anastomosis, with                        no stigmata of bleeding. Biopsied.                       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 Dilated.                       - Jejunal polyp(s). Biopsied. These appeared to be                        inflammatory polyps                       - Both limbs of the Bilroth II anatomy were intubated                        and appeared normal with wide openings and no narrowing. Recommendation:       - Take prescribed proton pump inhibitor or H2 blocker                        (  antacid) medications 30 - 60 minutes before meals.                       - Follow an  antireflux regimen.                       - Await pathology results.                       - Avoid NSAIDs except Aspirin if medically indicated                       - Return to my office in 4 weeks.                       - Soft diet.                       - Continue present medications.                       - Return to primary care physician as previously                        scheduled.                       - The findings and recommendations were discussed with                        the patient.                       - The findings and recommendations were discussed with                        the patient's family. Procedure Code(s):    --- Professional ---                       [phone], Esophagogastroduodenoscopy, flexible, transoral;                        with dilation of gastric/duodenal stricture(s) (eg,                        balloon, bougie)                       43239, Esophagogastroduodenoscopy, flexible, transoral;                        with biopsy, single or multiple Diagnosis Code(s):    --- Professional ---                       K25.9, Gastric ulcer, unspecified as acute or chronic,                        without hemorrhage or perforation                       Z98.0, Intestinal bypass and anastomosis status                       D13.39, Benign neoplasm of other  parts of small                        intestine                       R13.10, Dysphagia, unspecified                       R12, Heartburn                       R11.2, Nausea with vomiting, unspecified                       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 All rights reserved. The codes documented in this report are preliminary and upon coder review may  be revised to meet current compliance requirements.  [name], MD Margretta Sidle B. Bonna Gains MD, MD 02/15/2018 10:53:40 AM This report has been signed electronically. Number of Addenda: 0 Note  Initiated On: 02/15/2018 9:48 AM Estimated Blood Loss: Estimated blood loss: none.      [facility]

## 2018-02-15 NOTE — Telephone Encounter (Signed)
Left vm to get pt scheduled for 4 week fu with Dr. Bonna Gains

## 2018-02-15 NOTE — Anesthesia Procedure Notes (Signed)
Date/Time: 02/15/2018 10:04 AM Performed by: Nelda Marseille, CRNA Pre-anesthesia Checklist: Patient identified, Emergency Drugs available, Suction available, Patient being monitored and Timeout performed Oxygen Delivery Method: Nasal cannula

## 2018-02-15 NOTE — Anesthesia Preprocedure Evaluation (Signed)
Anesthesia Evaluation  Patient identified by MRN, date of birth, ID band Patient awake    Reviewed: Allergy & Precautions, H&P , NPO status , Patient's Chart, lab work & pertinent test results  History of Anesthesia Complications Negative for: history of anesthetic complications  Airway Mallampati: III  TM Distance: <3 FB Neck ROM: limited    Dental  (+) Chipped, Poor Dentition   Pulmonary sleep apnea ,           Cardiovascular Exercise Tolerance: Good (-) angina(-) Past MI and (-) DOE negative cardio ROS       Neuro/Psych  Headaches, negative psych ROS   GI/Hepatic Neg liver ROS, GERD  Medicated and Controlled,  Endo/Other  negative endocrine ROS  Renal/GU negative Renal ROS  negative genitourinary   Musculoskeletal   Abdominal   Peds  Hematology negative hematology ROS (+)   Anesthesia Other Findings Past Medical History: No date: Anemia No date: Blood transfusion without reported diagnosis No date: GERD (gastroesophageal reflux disease) 2015: Restless leg syndrome     Comment:  R leg   Past Surgical History: No date: ABDOMINAL HYSTERECTOMY No date: APPENDECTOMY No date: CHOLECYSTECTOMY 2 years ago: COLONOSCOPY 2013: STOMACH SURGERY 05/2017: UPPER GASTROINTESTINAL ENDOSCOPY     Comment:  pt stated "bleeding ulcer and stopped up duodenum was               noted"  BMI    Body Mass Index:  24.69 kg/m      Reproductive/Obstetrics negative OB ROS                             Anesthesia Physical  Anesthesia Plan  ASA: III  Anesthesia Plan: General   Post-op Pain Management:    Induction: Intravenous  PONV Risk Score and Plan: Propofol infusion and TIVA  Airway Management Planned: Natural Airway and Nasal Cannula  Additional Equipment:   Intra-op Plan:   Post-operative Plan:   Informed Consent: I have reviewed the patients History and Physical, chart, labs and  discussed the procedure including the risks, benefits and alternatives for the proposed anesthesia with the patient or authorized representative who has indicated his/her understanding and acceptance.   Dental Advisory Given  Plan Discussed with: Anesthesiologist, CRNA and Surgeon  Anesthesia Plan Comments: (Patient consented for risks of anesthesia including but not limited to:  - adverse reactions to medications - risk of intubation if required - damage to teeth, lips or other oral mucosa - sore throat or hoarseness - Damage to heart, brain, lungs or loss of life  Patient voiced understanding.)        Anesthesia Quick Evaluation

## 2018-02-15 NOTE — H&P (Signed)
[name], MD [address], [place], [place], [place], [address] [address], [place], [place], [place], [address] Phone: [phone]  Fax: [phone]  Primary Care Physician:  [name], MD   Pre-Procedure History & Physical: HPI:  Jocelyn Case is a 72 y.o. female is here for an EGD.   Past Medical History:  Diagnosis Date  . Anemia   . Blood transfusion without reported diagnosis   . Complication of anesthesia   . GERD (gastroesophageal reflux disease)   . PONV (postoperative nausea and vomiting)   . Restless leg syndrome 2015   R leg   . Sleep apnea     Past Surgical History:  Procedure Laterality Date  . ABDOMINAL HYSTERECTOMY    . APPENDECTOMY    . CHOLECYSTECTOMY    . COLONOSCOPY  2 years ago  . STOMACH SURGERY  2013  . UPPER GASTROINTESTINAL ENDOSCOPY  05/2017   pt stated "bleeding ulcer and stopped up duodenum was noted"    Prior to Admission medications   Medication Sig Start Date End Date Taking? Authorizing Provider  cholecalciferol (VITAMIN D) 400 units TABS tablet Take 2,000 Units by mouth daily.   Yes [provider]  colestipol (COLESTID) 1 g tablet Take 1 g by mouth.  07/31/17 07/31/18 Yes [provider]  cyanocobalamin (,VITAMIN B-12,) 1000 MCG/ML injection Inject 1,000 mcg into the muscle every 30 (thirty) days.   Yes [provider]  folic acid (FOLVITE) 500 MCG tablet Take 400 mcg by mouth daily.  07/31/17  Yes [provider]  metoCLOPramide (REGLAN) 5 MG tablet Take 5 mg by mouth 2 (two) times daily.    Yes [provider]  mirtazapine (REMERON) 15 MG tablet Take 15 mg by mouth at bedtime.  07/31/17 07/31/18 Yes [provider]  pantoprazole (PROTONIX) 40 MG tablet Take 1 tablet (40 mg total) by mouth daily. 12/24/17 12/24/18 Yes Jaiona Simien, Margretta Sidle B, MD  rOPINIRole (REQUIP) 0.25 MG tablet Take 0.25 mg by mouth at bedtime.  07/31/17 07/31/18 Yes [provider]    sucralfate (CARAFATE) 1 g tablet Take 1 g by mouth 4 (four) times daily.  07/31/17 07/31/18 Yes [provider]  metoCLOPramide (REGLAN) 5 MG tablet Take by mouth. 07/31/17 01/04/18  [provider]  promethazine (PHENERGAN) 25 MG tablet Take by mouth.  07/31/17 01/04/18  [provider]    Allergies as of 02/14/2018  . (No Known Allergies)    Family History  Problem Relation Age of Onset  . Peptic Ulcer Disease Mother   . Dementia Mother   . COPD Father   . Colon cancer Father     Social History   Socioeconomic History  . Marital status: Widowed    Spouse name: Not on file  . Number of children: Not on file  . Years of education: Not on file  . Highest education level: Not on file  Occupational History  . Not on file  Social Needs  . Financial resource strain: Not on file  . Food insecurity:    Worry: Not on file    Inability: Not on file  . Transportation needs:    Medical: Not on file    Non-medical: Not on file  Tobacco Use  . Smoking status: Never Smoker  . Smokeless tobacco: Never Used  Substance and Sexual Activity  . Alcohol use: Yes    Comment: drinks wine with dinner  . Drug use: No  . Sexual activity: Never  Lifestyle  .  Physical activity:    Days per week: Not on file    Minutes per session: Not on file  . Stress: Not on file  Relationships  . Social connections:    Talks on phone: Not on file    Gets together: Not on file    Attends religious service: Not on file    Active member of club or organization: Not on file    Attends meetings of clubs or organizations: Not on file    Relationship status: Not on file  . Intimate partner violence:    Fear of current or ex partner: Not on file    Emotionally abused: Not on file    Physically abused: Not on file    Forced sexual activity: Not on file  Other Topics Concern  . Not on file  Social History Narrative  . Not on file    Review of Systems: See HPI, otherwise  negative ROS  Physical Exam: BP 135/68   Pulse 87   Temp (!) 96.8 F (36 C) (Tympanic)   Resp 18   Ht 5\' 2"  (1.575 m)   Wt 135 lb (61.2 kg)   SpO2 98%   BMI 24.69 kg/m  General:   Alert,  pleasant and cooperative in NAD Head:  Normocephalic and atraumatic. Neck:  Supple; no masses or thyromegaly. Lungs:  Clear throughout to auscultation, normal respiratory effort.    Heart:  +S1, +S2, Regular rate and rhythm, No edema. Abdomen:  Soft, nontender and nondistended. Normal bowel sounds, without guarding, and without rebound.   Neurologic:  Alert and  oriented x4;  grossly normal neurologically.  Impression/Plan: Jocelyn Case is here for an EGD for Nausea/vomiting. EGD attempted yesterday showed food in stomach, repeat exam today with only clear liquid diet yesterday.  Risks, benefits, limitations, and alternatives regarding the procedure have been reviewed with the patient.  Questions have been answered.  All parties agreeable.   Virgel Manifold, MD  02/15/2018, 9:02 AM

## 2018-02-15 NOTE — Transfer of Care (Signed)
Immediate Anesthesia Transfer of Care Note  Patient: Kaizlee Carlino  Procedure(s) Performed: ESOPHAGOGASTRODUODENOSCOPY (EGD) WITH PROPOFOL (N/A )  Patient Location: PACU  Anesthesia Type:General  Level of Consciousness: awake and sedated  Airway & Oxygen Therapy: Patient Spontanous Breathing and Patient connected to nasal cannula oxygen  Post-op Assessment: Report given to RN and Post -op Vital signs reviewed and stable  Post vital signs: Reviewed and stable  Last Vitals:  Vitals Value Taken Time  BP    Temp    Pulse 88 02/15/2018 10:39 AM  Resp 23 02/15/2018 10:39 AM  SpO2 97 % 02/15/2018 10:39 AM  Vitals shown include unvalidated device data.  Last Pain:  Vitals:   02/15/18 0846  TempSrc: Tympanic  PainSc: 0-No pain         Complications: No apparent anesthesia complications

## 2018-02-15 NOTE — Anesthesia Post-op Follow-up Note (Signed)
Anesthesia QCDR form completed.        

## 2018-02-16 NOTE — Anesthesia Postprocedure Evaluation (Signed)
Anesthesia Post Note  Patient: Hendy Brindle  Procedure(s) Performed: ESOPHAGOGASTRODUODENOSCOPY (EGD) WITH PROPOFOL (N/A )  Patient location during evaluation: Endoscopy Anesthesia Type: General Level of consciousness: awake and alert Pain management: pain level controlled Vital Signs Assessment: post-procedure vital signs reviewed and stable Respiratory status: spontaneous breathing, nonlabored ventilation, respiratory function stable and patient connected to nasal cannula oxygen Cardiovascular status: blood pressure returned to baseline and stable Postop Assessment: no apparent nausea or vomiting Anesthetic complications: no     Last Vitals:  Vitals:   02/15/18 1110 02/15/18 1120  BP: 131/76 130/75  Pulse: 89 91  Resp: (!) 24 (!) 21  Temp:    SpO2: 96% 97%    Last Pain:  Vitals:   02/16/18 1120  TempSrc:   PainSc: 0-No pain                 Martha Clan

## 2018-02-18 ENCOUNTER — Encounter: Payer: Self-pay | Admitting: Gastroenterology

## 2018-02-18 LAB — SURGICAL PATHOLOGY

## 2018-02-20 ENCOUNTER — Telehealth: Payer: Self-pay

## 2018-02-20 NOTE — Telephone Encounter (Signed)
Advised patient of results per Dr. Darene Lamer.    - biopsies did not show any cancerous changes. Her polyps in her small bowel, were related to granulation tissue which is benign. She should follow-up with me in clinic as planned

## 2018-03-05 ENCOUNTER — Ambulatory Visit (INDEPENDENT_AMBULATORY_CARE_PROVIDER_SITE_OTHER): Payer: Medicare Other | Admitting: Gastroenterology

## 2018-03-05 ENCOUNTER — Other Ambulatory Visit: Payer: Self-pay

## 2018-03-05 ENCOUNTER — Encounter: Payer: Self-pay | Admitting: Gastroenterology

## 2018-03-05 VITALS — BP 105/72 | HR 99 | Ht 62.0 in | Wt 135.8 lb

## 2018-03-05 DIAGNOSIS — Z980 Intestinal bypass and anastomosis status: Secondary | ICD-10-CM

## 2018-03-05 DIAGNOSIS — G43A0 Cyclical vomiting, not intractable: Secondary | ICD-10-CM

## 2018-03-05 DIAGNOSIS — R1115 Cyclical vomiting syndrome unrelated to migraine: Secondary | ICD-10-CM

## 2018-03-05 NOTE — Patient Instructions (Signed)
F/U 3-4 months

## 2018-03-05 NOTE — Progress Notes (Signed)
[name], MD [address]  [place]  [place], [place] [address]  Main: [phone]  Fax: [phone]   Primary Care Physician: [name], MD  Primary Gastroenterologist:  Dr. [name]  Chief complaint: Follow-up for intermittent nausea and vomiting  HPI: Jocelyn Case is a 72 y.o. female with history of gastric ulcer and gastrectomy over 10 years ago in [place], who recently moved to [place].  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  She recently underwent EGD in May 2019, due to intermittent nausea and vomiting.  The initial procedure showed large amount of fluid in the stomach, and the procedure had to be rescheduled for next day.  Her future procedures should always does be done with 2 days of clear liquid diet , and n.p.o. past midnight.  Mild stenosis was noted at the Billroth II anastomosis site, and through-the-scope dilator was used to dilate to 13.56mm.  Since the dilation, she reports much improved symptoms.  States she does not have any nausea or vomiting anymore.  Is eating small frequent meals without dysphagia, nausea or vomiting, or abdominal pain.  No altered bowel habits, blood in stool, melena.  Is also on PPI, due to acid reflux, and ulcerated mucosa seen at the anastomosis site, which she states has improved her symptoms as far as relieving her heartburn.  Findings:      The examined esophagus was normal. 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      Retained fluid was found in the entire examined stomach. Removed       immediately after entering the stomach. No residual fluid or food left       after completely suctioning it.      Few non-bleeding superficial gastric ulcers with no stigmata of  bleeding       were found at the anastomosis. The largest lesion was 5 mm in largest       dimension. Biopsies were taken with a cold forceps for histology.      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 This was       characterized by mild stenosis and ulceration. A TTS dilator was passed       through the scope. Dilation with a 12-13.5-15 mm balloon dilator was       performed to 13.5 mm. The dilation site was examined and showed good       heme effect.      A 2 to 4 mm sessile polyp with no bleeding was found in the jejunum.       Biopsies were taken with a cold forceps for histology.      Exam of the jejunum was otherwise normal.      Both limbs of the Bilroth II anatomy were intubated and appeared normal       with wide openings and no narrowing. Impression:           - Normal esophagus. Biopsied. GE Junction at 36 cm                       - Retained gastric fluid.                       -  Non-bleeding gastric ulcers at the anastomosis, with                        no stigmata of bleeding. Biopsied.                       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 Dilated.                       - Jejunal polyp(s). Biopsied. These appeared to be                        inflammatory polyps                       - Both limbs of the Bilroth II anatomy were intubated                        and appeared normal with wide openings and no narrowing. Recommendation:       - Take prescribed proton pump inhibitor or H2 blocker                        (antacid) medications 30 - 60 minutes before meals.                       - Follow an antireflux regimen.                       - Await pathology results.  DIAGNOSIS:  A. POLYP, SMALL BOWEL; COLD BIOPSY:  - POLYPOID GRANULATION TISSUE.  - FOCAL AREA OF GASTRIC TYPE MUCOSA SEEN.  - NEGATIVE FOR INFECTIOUS AGENTS, DYSPLASIA AND MALIGNANCY.   B.  ANASTOMOTIC ULCER; COLD BIOPSY:  - FRAGMENTS OF ULCERATION WITH FRAGMENTS OF MODERATELY INFLAMED GASTRIC  AND SMALL BOWEL MUCOSA.  - NEGATIVE FOR H. PYLORI, DYSPLASIA AND MALIGNANCY.   C. ESOPHAGUS; COLD BIOPSY:  - INFLAMED ESOPHAGEAL MUCOSA WITH SPONGIOSIS AND UP TO 5 EOSINOPHILS AND  A HIGH-POWER FIELD.  - NEGATIVE FOR DYSPLASIA AND MALIGNANCY.   Note: Changes in the esophagus are most consistent with reflux  esophagitis.   Previous history: As per notes, patient underwent endoscopy in August 2018 "revealing tight opening at the prior surgical area, mucosal ulceration and friable mucosa was detected, biopsy completed."  It appears that the initial EGD could not be completed due to food in the stomach, and had to be repeated after 2 days of fasting.  Last colonoscopy date: 2015  Actual colonoscopy and EGD report not available.  Above records obtained from clinic notes sent over from [place].  MRCP from February 2008: " Stable MRI of the abdomen and MRCP of the abdomen.  There has been no change since prior examination from November 2007. No evidence of intra-or extrahepatic biliary duct dilation.  No retained stone.  Pancreatic duct appears within normal limits."  [facility]. 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   Their note also states "she does have evidence of bacterial overgrowth and culture and this probably explains a steatorrhea documented on fecal fat testing."      Current Outpatient Medications  Medication Sig Dispense Refill  . cholecalciferol (VITAMIN D) 400 units TABS tablet Take 2,000 Units by mouth daily.    . colestipol (COLESTID) 1 g  tablet Take 1 g by mouth.     . cyanocobalamin (,VITAMIN B-12,) 1000 MCG/ML injection Inject 1,000 mcg into the muscle every 30 (thirty) days.    . folic acid (FOLVITE) 678 MCG tablet Take 400 mcg by mouth daily.     . metoCLOPramide (REGLAN) 5 MG tablet Take by mouth.    . metoCLOPramide (REGLAN) 5 MG tablet Take 5 mg by mouth 2 (two) times daily.     . mirtazapine (REMERON) 15 MG tablet Take 15 mg by mouth at bedtime.     . pantoprazole (PROTONIX) 40 MG tablet Take 1 tablet (40 mg total) by mouth 2 (two) times daily before a meal. 60 tablet 1  . promethazine (PHENERGAN) 25 MG tablet Take by mouth.     Marland Kitchen rOPINIRole (REQUIP) 0.25 MG tablet Take 0.25 mg by mouth at bedtime.     . sucralfate (CARAFATE) 1 g tablet Take 1 tablet (1 g total) by mouth 4 (four) times daily. 120 tablet 1   No current facility-administered medications for this visit.     Allergies as of 03/05/2018  . (No Known Allergies)    ROS:  General: Negative for anorexia, weight loss, fever, chills, fatigue, weakness. ENT: Negative for hoarseness, difficulty swallowing , nasal congestion. CV: Negative for chest pain, angina, palpitations, dyspnea on exertion, peripheral edema.  Respiratory: Negative for dyspnea at rest, dyspnea on exertion, cough, sputum, wheezing.  GI: See history of present illness. GU:  Negative for dysuria, hematuria, urinary incontinence, urinary frequency, nocturnal urination.  Endo: Negative for unusual weight change.    Physical Examination:  Vitals:   03/05/18 1310  BP: 105/72  Pulse: 99  Weight: 135 lb 12.8 oz (61.6 kg)  Height: 5\' 2"  (1.575 m)    General: Well-nourished, well-developed in no acute distress.  Eyes: No icterus. Conjunctivae pink. Mouth: Oropharyngeal mucosa moist and pink , no lesions erythema or exudate. Neck: Supple, Trachea midline Abdomen: Bowel sounds are normal, nontender, nondistended, no hepatosplenomegaly or masses, no abdominal bruits or hernia , no rebound  or guarding.   Extremities: No lower extremity edema. No clubbing or deformities. Neuro: Alert and oriented x 3.  Grossly intact. Skin: Warm and dry, no jaundice.   Psych: Alert and cooperative, normal mood and affect.   Labs: CMP     Component Value Date/Time   NA 137 11/05/2017 1141   K 3.8 11/05/2017 1141   CL 105 11/05/2017 1141   CO2 23 11/05/2017 1141   GLUCOSE 97 11/05/2017 1141   BUN 19 11/05/2017 1141   CREATININE 0.57 11/05/2017 1141   CALCIUM 8.3 (L) 11/05/2017 1141   PROT 5.5 (L) 11/05/2017 1141   ALBUMIN 2.7 (L) 11/05/2017 1141   AST 25 11/05/2017 1141   ALT 19 11/05/2017 1141   ALKPHOS 55 11/05/2017 1141   BILITOT 0.4 11/05/2017 1141   GFRNONAA >60 11/05/2017 1141   GFRAA >60 11/05/2017 1141   Lab Results  Component Value Date   WBC 4.5 02/15/2018   HGB 10.6 (L) 02/15/2018   HCT 33.4 (L) 02/15/2018   MCV 90.1  02/15/2018   PLT 339 02/15/2018    Imaging Studies: No results found.  Assessment and Plan:   Jocelyn Case is a 72 y.o. y/o female with history of gastrectomy, Billroth II, for gastric ulcers over 10 years ago, with recent EGD in May 2019 with dilation of the Billroth II anastomosis done to 13.5 mm here for follow-up, and reports resolution of nausea vomiting since the dilation   Continue small frequent meals Symptoms much improved No indication for repeat EGD at this time If symptoms recur, I have asked patient to call us, and repeat EGD for dilation can be considered again at that time Continue hematology follow-up for IV iron transfusions  Protonix was started due to ulcerated mucosa seen at the anastomosis site.  This was only started about 2 weeks ago.  It is also helping with her acid reflux significantly Therefore, we will continue the medication, and decrease dose in the future.  At this time benefits of medication, and healing the ulceration at the anastomosis site, as far as the nausea vomiting, and heartburn concern helping her symptoms,  outweigh the risks at this time. (Risks of PPI use were discussed with patient including bone loss, C. Diff diarrhea, pneumonia, infections, CKD, electrolyte abnormalities.  If clinically possible based on symptoms, goal would be to maintain patient on the lowest dose possible, or discontinue the medication with institution of acid reflux lifestyle modifications over time. Pt. Verbalizes understanding and chooses to continue the medication.)  Last colonoscopy was in 2015, colonoscopy report not available.  Patient does not think any polyps were removed at the time.  Also reports colon cancer history in her father, therefore will perform colonoscopy every 5 years, next one in 2020.       Dr [name]

## 2018-03-08 ENCOUNTER — Inpatient Hospital Stay: Payer: Medicare Other | Attending: Hematology and Oncology

## 2018-03-08 DIAGNOSIS — D500 Iron deficiency anemia secondary to blood loss (chronic): Secondary | ICD-10-CM

## 2018-03-08 DIAGNOSIS — E538 Deficiency of other specified B group vitamins: Secondary | ICD-10-CM | POA: Diagnosis present

## 2018-03-08 MED ORDER — CYANOCOBALAMIN 1000 MCG/ML IJ SOLN
1000.0000 ug | Freq: Once | INTRAMUSCULAR | Status: AC
  Administered 2018-03-08: 1000 ug via INTRAMUSCULAR

## 2018-03-14 ENCOUNTER — Other Ambulatory Visit: Payer: Self-pay | Admitting: Gastroenterology

## 2018-04-05 ENCOUNTER — Inpatient Hospital Stay: Payer: Medicare Other | Attending: Hematology and Oncology

## 2018-04-05 DIAGNOSIS — R5383 Other fatigue: Secondary | ICD-10-CM | POA: Insufficient documentation

## 2018-04-05 DIAGNOSIS — D500 Iron deficiency anemia secondary to blood loss (chronic): Secondary | ICD-10-CM

## 2018-04-05 DIAGNOSIS — R110 Nausea: Secondary | ICD-10-CM | POA: Insufficient documentation

## 2018-04-05 DIAGNOSIS — K219 Gastro-esophageal reflux disease without esophagitis: Secondary | ICD-10-CM | POA: Insufficient documentation

## 2018-04-05 DIAGNOSIS — K635 Polyp of colon: Secondary | ICD-10-CM | POA: Insufficient documentation

## 2018-04-05 DIAGNOSIS — K259 Gastric ulcer, unspecified as acute or chronic, without hemorrhage or perforation: Secondary | ICD-10-CM | POA: Diagnosis not present

## 2018-04-05 DIAGNOSIS — D509 Iron deficiency anemia, unspecified: Secondary | ICD-10-CM | POA: Insufficient documentation

## 2018-04-05 MED ORDER — CYANOCOBALAMIN 1000 MCG/ML IJ SOLN
1000.0000 ug | Freq: Once | INTRAMUSCULAR | Status: AC
  Administered 2018-04-05: 1000 ug via INTRAMUSCULAR

## 2018-04-08 ENCOUNTER — Inpatient Hospital Stay: Payer: Medicare Other

## 2018-04-08 ENCOUNTER — Other Ambulatory Visit: Payer: Self-pay | Admitting: *Deleted

## 2018-04-08 DIAGNOSIS — D500 Iron deficiency anemia secondary to blood loss (chronic): Secondary | ICD-10-CM

## 2018-04-08 DIAGNOSIS — D509 Iron deficiency anemia, unspecified: Secondary | ICD-10-CM | POA: Diagnosis not present

## 2018-04-08 LAB — CBC WITH DIFFERENTIAL/PLATELET
Basophils Absolute: 0 10*3/uL (ref 0–0.1)
Basophils Relative: 1 %
Eosinophils Absolute: 0.3 10*3/uL (ref 0–0.7)
Eosinophils Relative: 8 %
HCT: 32.2 % — ABNORMAL LOW (ref 35.0–47.0)
Hemoglobin: 10.3 g/dL — ABNORMAL LOW (ref 12.0–16.0)
Lymphocytes Relative: 32 %
Lymphs Abs: 1.5 10*3/uL (ref 1.0–3.6)
MCH: 28.7 pg (ref 26.0–34.0)
MCHC: 32 g/dL (ref 32.0–36.0)
MCV: 89.9 fL (ref 80.0–100.0)
Monocytes Absolute: 0.4 10*3/uL (ref 0.2–0.9)
Monocytes Relative: 10 %
Neutro Abs: 2.3 10*3/uL (ref 1.4–6.5)
Neutrophils Relative %: 49 %
Platelets: 289 10*3/uL (ref 150–440)
RBC: 3.58 MIL/uL — ABNORMAL LOW (ref 3.80–5.20)
RDW: 14.4 % (ref 11.5–14.5)
WBC: 4.6 10*3/uL (ref 3.6–11.0)

## 2018-04-08 LAB — FERRITIN: Ferritin: 12 ng/mL (ref 11–307)

## 2018-04-08 NOTE — Progress Notes (Addendum)
[facility] day:  04/09/2018   Chief Complaint: Jocelyn Case is a 72 y.o. female s/p partial gastrectomy and low iron who is seen for a 3 month assessment.  HPI: The patient was last seen in the hematology clinic on 04/05//2019.  At that time, patient was doing well overall. Her energy level had improved somewhat. She complained of continued reflux and nausea. (+) restless legs at night. She had a repeat EGD scheduled for 03/2018. Exam is unremarkable.  Hemoglobin 10.9. Ferritin 71. She received B12 injection.  Patient has continued monthly B12 1,000 mcg injections since her last visit. She received her last injection on 04/05/2018.   CBC on 02/15/2018 revealed a WBC of 4500 ([place] 2900). Hemoglobin 10.6, hematocrit 33.4, MCV 90.1, and platelets 339,000.  Ferritin had drifted down to 45.   She was seen in consult on 02/06/2018 by Dr. [name] (gastroenterology). Notes reviewed. Patient continued to have nausea and reflux issues, those of which were exacerbated by eating fiber rich solid foods. Repeat EGD was discussed. Of note, patient's last EGD was complicated by an inadequate prep, requiring a 2 day fast to resolve. 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 She has required treatment for SIBO in the past with rifaximin. Colonoscopy was also discussed. She last had a routine colonoscopy in 2015. She has no family history of colorectal cancer, therefore this procedure will be considered at a later date.   Patient went in for scheduled ECG on 02/14/2018, however the procedure was ultimately aborted due to large amount of gastric contents preventing necessary direct visualization of the gastric mucosa. She returned on 02/15/2018 for repeat EGD attempt. EGD demonstrated a normal esophagus. There were  non-bleeding gastric ulcers and jejunal polyps noted. Patient s/p Billroth II gastrojejunostomy. Stenosis and ulceration noted. Billroth II limbs required dilatation. Pathology of the sampled ulcerated mucosa and of the sessile jejunal polyp were all negative for high grade dysplasia and malignancy.   CBC on 04/08/2018 revealed a WBC of 4600 ([place] 2300). Hemoglobin 10.3, hematocrit 32.2, MCV 89.9, and platelets 289,000. Ferritin had dropped to 12.   In the interim, patient continues to be fatigued. Her reflex is better controlled with daily pantoprazole. She continues to have frequent nausea episodes. She has identified salads as being a trigger food. Patient denies bleeding; no hematochezia, melena, or gross hematuria.  Patient denies that she has experienced any B symptoms. She denies any interval infections. Patient advises that she maintains an adequate appetite. She is eating well and making attempts to incorporate iron rich foods into her diet. Weight today is 134 lb 5 oz (60.9 kg), which compared to her last visit to the clinic, represents a 7 pound decrease. She denies ice pica. (+) restless legs at night.   Patient denies pain in the clinic today.  Past Medical History:  Diagnosis Date  . Anemia   . Blood transfusion without reported diagnosis   . Complication of anesthesia   . GERD (gastroesophageal reflux disease)   . PONV (postoperative nausea and vomiting)   . Restless leg syndrome 2015   R leg   . Sleep apnea     Past Surgical History:  Procedure Laterality Date  . ABDOMINAL HYSTERECTOMY    . APPENDECTOMY    . CHOLECYSTECTOMY    . COLONOSCOPY  2 years ago  . ESOPHAGOGASTRODUODENOSCOPY (EGD) WITH  PROPOFOL N/A 02/14/2018   Procedure: ESOPHAGOGASTRODUODENOSCOPY (EGD) WITH PROPOFOL;  Surgeon: Virgel Manifold, MD;  Location: [facility] ENDOSCOPY;  Service: Endoscopy;  Laterality: N/A;  . ESOPHAGOGASTRODUODENOSCOPY (EGD) WITH PROPOFOL N/A 02/15/2018   Procedure:  ESOPHAGOGASTRODUODENOSCOPY (EGD) WITH PROPOFOL;  Surgeon: Virgel Manifold, MD;  Location: [facility] ENDOSCOPY;  Service: Endoscopy;  Laterality: N/A;  . STOMACH SURGERY  2013  . UPPER GASTROINTESTINAL ENDOSCOPY  05/2017   pt stated "bleeding ulcer and stopped up duodenum was noted"    Family History  Problem Relation Age of Onset  . Peptic Ulcer Disease Mother   . Dementia Mother   . COPD Father   . Colon cancer Father     Social History:  reports that she has never smoked. She has never used smokeless tobacco. She reports that she drinks alcohol. She reports that she does not use drugs.  She drinks wine in the evening.  She previously lived in [place].  She moved to [place] in 05/2017.  Her daughter lives in [place].  She is temporarily living in an apartment in [place].  The patient is alone today.  Allergies: No Known Allergies  Current Medications: Current Outpatient Medications  Medication Sig Dispense Refill  . cholecalciferol (VITAMIN D) 400 units TABS tablet Take 2,000 Units by mouth daily.    . colestipol (COLESTID) 1 g tablet Take 1 g by mouth.     . cyanocobalamin (,VITAMIN B-12,) 1000 MCG/ML injection Inject 1,000 mcg into the muscle every 30 (thirty) days.    . folic acid (FOLVITE) 683 MCG tablet Take 400 mcg by mouth daily.     . metoCLOPramide (REGLAN) 5 MG tablet Take 5 mg by mouth 2 (two) times daily.     . mirtazapine (REMERON) 15 MG tablet Take 15 mg by mouth at bedtime.     . pantoprazole (PROTONIX) 40 MG tablet TAKE 1 TABLET BY MOUTH TWICE DAILY, TAKE BEFORE A MEAL 180 tablet 1  . rOPINIRole (REQUIP) 0.25 MG tablet Take 0.25 mg by mouth at bedtime.     . metoCLOPramide (REGLAN) 5 MG tablet Take by mouth.    . promethazine (PHENERGAN) 25 MG tablet Take by mouth.     . sucralfate (CARAFATE) 1 g tablet Take 1 tablet (1 g total) by mouth 4 (four) times daily. 120 tablet 1   No current facility-administered medications for this visit.     Review of  Systems  Constitutional: Positive for malaise/fatigue and weight loss (down 7 dounds). Negative for diaphoresis and fever.       "I am just so tired".   HENT: Positive for hearing loss (hearing aid). Negative for nosebleeds and sore throat.   Eyes: Negative.   Respiratory: Negative for cough, hemoptysis, sputum production and shortness of breath.   Cardiovascular: Negative for chest pain, palpitations, orthopnea, leg swelling and PND.  Gastrointestinal: Positive for heartburn (improved on daily PPI) and nausea. Negative for abdominal pain, blood in stool, constipation, diarrhea, melena and vomiting.       EGD on 02/15/2018  Genitourinary: Negative for dysuria, frequency, hematuria and urgency.  Musculoskeletal: Negative for back pain, falls, joint pain and myalgias.  Skin: Negative for itching and rash.  Neurological: Negative for dizziness, tremors, weakness and headaches.       Chronic restless legs  Endo/Heme/Allergies: Does not bruise/bleed easily.  Psychiatric/Behavioral: Negative for depression, memory loss and suicidal ideas. The patient is not nervous/anxious and does not have insomnia.   All other systems reviewed and are negative.  Performance status (ECOG): 1 - Symptomatic but completely ambulatory   Vital signs: BP 115/72 (BP Location: Left Arm, Patient Position: Sitting)   Pulse 83   Temp (!) 97.5 F (36.4 C) (Tympanic)   Resp 18   Wt 134 lb 5 oz (60.9 kg)   BMI 24.57 kg/m   Physical Exam  Constitutional: She is oriented to person, place, and time and well-developed, well-nourished, and in no distress.  HENT:  Head: Normocephalic and atraumatic.  Short graying brown hair. HOH - uses hearing aids.  Eyes: Pupils are equal, round, and reactive to light. EOM are normal. No scleral icterus.  Blue eyes.  Neck: Normal range of motion. Neck supple. No tracheal deviation present. No thyromegaly present.  Cardiovascular: Normal rate, regular rhythm and normal heart sounds.  Exam reveals no gallop and no friction rub.  No murmur heard. Pulmonary/Chest: Effort normal and breath sounds normal. No respiratory distress. She has no wheezes. She has no rales.  Abdominal: Soft. Bowel sounds are normal. She exhibits no distension. There is no tenderness.  Musculoskeletal: Normal range of motion. She exhibits no edema or tenderness.  Lymphadenopathy:    She has no cervical adenopathy.    She has no axillary adenopathy.       Right: No inguinal and no supraclavicular adenopathy present.       Left: No inguinal and no supraclavicular adenopathy present.  Neurological: She is alert and oriented to person, place, and time.  Skin: Skin is warm and dry. No rash noted. No erythema.  Psychiatric: Mood, affect and judgment normal.  Nursing note and vitals reviewed.   Appointment on 04/08/2018  Component Date Value Ref Range Status  . Ferritin 04/08/2018 12  11 - 307 ng/mL Final   Performed at [facility], [address] [place], [place] [address]  . WBC 04/08/2018 4.6  3.6 - 11.0 K/uL Final  . RBC 04/08/2018 3.58* 3.80 - 5.20 MIL/uL Final  . Hemoglobin 04/08/2018 10.3* 12.0 - 16.0 g/dL Final  . HCT 04/08/2018 32.2* 35.0 - 47.0 % Final  . MCV 04/08/2018 89.9  80.0 - 100.0 fL Final  . MCH 04/08/2018 28.7  26.0 - 34.0 pg Final  . MCHC 04/08/2018 32.0  32.0 - 36.0 g/dL Final  . RDW 04/08/2018 14.4  11.5 - 14.5 % Final  . Platelets 04/08/2018 289  150 - 440 K/uL Final  . Neutrophils Relative % 04/08/2018 49  % Final  . Neutro Abs 04/08/2018 2.3  1.4 - 6.5 K/uL Final  . Lymphocytes Relative 04/08/2018 32  % Final  . Lymphs Abs 04/08/2018 1.5  1.0 - 3.6 K/uL Final  . Monocytes Relative 04/08/2018 10  % Final  . Monocytes Absolute 04/08/2018 0.4  0.2 - 0.9 K/uL Final  . Eosinophils Relative 04/08/2018 8  % Final  . Eosinophils Absolute 04/08/2018 0.3  0 - 0.7 K/uL Final  . Basophils Relative 04/08/2018 1  % Final  . Basophils Absolute 04/08/2018 0.0  0 - 0.1 K/uL  Final   Performed at [facility], [address], [place], [place] [address]    Assessment:  Jocelyn Case is a 72 y.o. female s/p partial gastrectomy in 2009 and subsequent iron deficiency anemia.  She previously received IV iron off/on every 6 months for the past 3 years while living in [place].  She is intolerant of oral iron.  Work-up on 11/23/2017 revealed a hematocrit of 31, hemoglobin 9.6, MCV 84.7, platelets 379,000, WBC 5600 with an ANC of 3200.  Ferritin was 7.  B12 was 267.  Folate was 28.  She received Venofer weekly x 4 (11/30/2017 - 12/21/2017).  Ferritin has been followed: 5 on 11/05/2017, 7 on 11/23/2017, 71 on 01/04/2018, 45 on 02/15/2018, and 12 on 04/08/2018.   She receives B12 IM monthly (last 04/05/2018).  She is on folic acid.  EGD in [place] in 05/2017 revealed retained food.  EGD after 2 days of fasting revealed an ulcer with some bleeding.  EGD attempted on 02/14/2018, however aborted by large amount of gastric contents. EGD on 02/15/2018 demonstrated a normal esophagus. There were non-bleeding gastric ulcers and jejunal polyps noted. Patient s/p Billroth II gastrojejunostomy. Stenosis and ulceration noted. Billroth II limbs required dilatation. Pathology of the sampled ulcerated mucosa and of the sessile jejunal polyp were all negative for high grade dysplasia and malignancy. She is followed by Dr. [name].  Symptomatically, patient is fatigued. Reflux has improved, but she still has frequent episodes of nausea. Patient denies bleeding; no hematochezia, melena, or gross hematuria. Exam is unremarkable.  Hemoglobin is 10.3.   Plan: 1. Review labs from 04/08/2018. Hemoglobin 10.3, hematocrit 32.2, MCV 89.9, and platelets 289,000. Ferritin down to 12 (previously 45).  2. Review interval EGD and plans for continued care with gastroenterology:   There were non-bleeding gastric ulcers and jejunal polyps noted.   Pathology (-) for high grade dysplasia and  malignancy.   Billroth II limbs required dilatation.   Continue PPI therapy as prescribed by gastroenterology.   Follow up with Dr. Bonna Gains as scheduled.  3. Discuss iron stores. Ferritin low at 12. Patient is symptomatic. Discussed additional intravenous iron treatments. Will given Venofer 200 mg IV today, then weekly x 2 (total of 3 infusions).  4. Continue B12 injections monthly.  5. Discuss routine mammogram. Patient just moved to [place] from [facility] in 05/2017. She has not had a mammogram since moving to [place]. I offered to order mammogram, however patient declined and advised that she would speak to Dr. Ouida Sills about it at her upcoming appointment.  6. RTC in 6 weeks for labs (CBC with diff, ferritin). 7. RTC in 3 months for MD assessment, labs (CBC with diff, ferritin- day before) and +/- Venofer.  Honor Loh, NP  04/09/2018, 1:47 PM

## 2018-04-09 ENCOUNTER — Inpatient Hospital Stay: Payer: Medicare Other

## 2018-04-09 ENCOUNTER — Other Ambulatory Visit: Payer: Self-pay | Admitting: Hematology and Oncology

## 2018-04-09 ENCOUNTER — Inpatient Hospital Stay (HOSPITAL_BASED_OUTPATIENT_CLINIC_OR_DEPARTMENT_OTHER): Payer: Medicare Other | Admitting: Urgent Care

## 2018-04-09 VITALS — BP 114/72 | HR 80 | Temp 96.9°F | Resp 18

## 2018-04-09 VITALS — BP 115/72 | HR 83 | Temp 97.5°F | Resp 18 | Wt 134.3 lb

## 2018-04-09 DIAGNOSIS — D509 Iron deficiency anemia, unspecified: Secondary | ICD-10-CM | POA: Diagnosis not present

## 2018-04-09 DIAGNOSIS — R110 Nausea: Secondary | ICD-10-CM

## 2018-04-09 DIAGNOSIS — G2581 Restless legs syndrome: Secondary | ICD-10-CM

## 2018-04-09 DIAGNOSIS — R5383 Other fatigue: Secondary | ICD-10-CM

## 2018-04-09 DIAGNOSIS — Z931 Gastrostomy status: Secondary | ICD-10-CM | POA: Diagnosis not present

## 2018-04-09 DIAGNOSIS — K635 Polyp of colon: Secondary | ICD-10-CM | POA: Diagnosis not present

## 2018-04-09 DIAGNOSIS — K259 Gastric ulcer, unspecified as acute or chronic, without hemorrhage or perforation: Secondary | ICD-10-CM

## 2018-04-09 DIAGNOSIS — D500 Iron deficiency anemia secondary to blood loss (chronic): Secondary | ICD-10-CM

## 2018-04-09 DIAGNOSIS — K219 Gastro-esophageal reflux disease without esophagitis: Secondary | ICD-10-CM | POA: Diagnosis not present

## 2018-04-09 DIAGNOSIS — E538 Deficiency of other specified B group vitamins: Secondary | ICD-10-CM

## 2018-04-09 MED ORDER — SODIUM CHLORIDE 0.9 % IV SOLN
Freq: Once | INTRAVENOUS | Status: AC
  Administered 2018-04-09: 11:00:00 via INTRAVENOUS
  Filled 2018-04-09: qty 1000

## 2018-04-09 MED ORDER — IRON SUCROSE 20 MG/ML IV SOLN
200.0000 mg | Freq: Once | INTRAVENOUS | Status: AC
  Administered 2018-04-09: 200 mg via INTRAVENOUS
  Filled 2018-04-09: qty 10

## 2018-04-09 NOTE — Progress Notes (Signed)
Patient offers no complaints today. 

## 2018-04-16 ENCOUNTER — Inpatient Hospital Stay: Payer: Medicare Other

## 2018-04-16 VITALS — BP 102/67 | HR 69 | Temp 97.1°F | Resp 16

## 2018-04-16 DIAGNOSIS — D509 Iron deficiency anemia, unspecified: Secondary | ICD-10-CM | POA: Diagnosis not present

## 2018-04-16 DIAGNOSIS — D500 Iron deficiency anemia secondary to blood loss (chronic): Secondary | ICD-10-CM

## 2018-04-16 MED ORDER — IRON SUCROSE 20 MG/ML IV SOLN
200.0000 mg | Freq: Once | INTRAVENOUS | Status: AC
Start: 2018-04-16 — End: 2018-04-16
  Administered 2018-04-16: 200 mg via INTRAVENOUS
  Filled 2018-04-16: qty 10

## 2018-04-16 MED ORDER — SODIUM CHLORIDE 0.9 % IV SOLN
INTRAVENOUS | Status: DC
  Administered 2018-04-16: 14:00:00 via INTRAVENOUS
  Filled 2018-04-16: qty 1000

## 2018-04-23 ENCOUNTER — Inpatient Hospital Stay: Payer: Medicare Other

## 2018-04-23 VITALS — BP 107/70 | HR 85 | Temp 98.3°F | Resp 20

## 2018-04-23 DIAGNOSIS — D500 Iron deficiency anemia secondary to blood loss (chronic): Secondary | ICD-10-CM

## 2018-04-23 DIAGNOSIS — D509 Iron deficiency anemia, unspecified: Secondary | ICD-10-CM | POA: Diagnosis not present

## 2018-04-23 MED ORDER — SODIUM CHLORIDE 0.9 % IV SOLN
Freq: Once | INTRAVENOUS | Status: AC
  Administered 2018-04-23: 14:00:00 via INTRAVENOUS
  Filled 2018-04-23: qty 1000

## 2018-04-23 MED ORDER — IRON SUCROSE 20 MG/ML IV SOLN
200.0000 mg | Freq: Once | INTRAVENOUS | Status: AC
  Administered 2018-04-23: 200 mg via INTRAVENOUS
  Filled 2018-04-23: qty 10

## 2018-04-23 NOTE — Progress Notes (Signed)
Use labs from 7/8 for iron today

## 2018-05-03 ENCOUNTER — Inpatient Hospital Stay: Payer: Medicare Other | Attending: Hematology and Oncology

## 2018-05-03 DIAGNOSIS — D509 Iron deficiency anemia, unspecified: Secondary | ICD-10-CM | POA: Insufficient documentation

## 2018-05-03 DIAGNOSIS — D500 Iron deficiency anemia secondary to blood loss (chronic): Secondary | ICD-10-CM

## 2018-05-03 MED ORDER — CYANOCOBALAMIN 1000 MCG/ML IJ SOLN
1000.0000 ug | Freq: Once | INTRAMUSCULAR | Status: AC
  Administered 2018-05-03: 1000 ug via INTRAMUSCULAR

## 2018-05-21 ENCOUNTER — Inpatient Hospital Stay: Payer: Medicare Other

## 2018-05-21 DIAGNOSIS — D500 Iron deficiency anemia secondary to blood loss (chronic): Secondary | ICD-10-CM

## 2018-05-21 DIAGNOSIS — D509 Iron deficiency anemia, unspecified: Secondary | ICD-10-CM | POA: Diagnosis not present

## 2018-05-21 LAB — CBC WITH DIFFERENTIAL/PLATELET
BASOS PCT: 1 %
Basophils Absolute: 0 10*3/uL (ref 0–0.1)
Eosinophils Absolute: 0.4 10*3/uL (ref 0–0.7)
Eosinophils Relative: 10 %
HCT: 33.5 % — ABNORMAL LOW (ref 35.0–47.0)
Hemoglobin: 10.7 g/dL — ABNORMAL LOW (ref 12.0–16.0)
LYMPHS ABS: 1.2 10*3/uL (ref 1.0–3.6)
Lymphocytes Relative: 34 %
MCH: 29.9 pg (ref 26.0–34.0)
MCHC: 31.9 g/dL — AB (ref 32.0–36.0)
MCV: 93.7 fL (ref 80.0–100.0)
MONO ABS: 0.5 10*3/uL (ref 0.2–0.9)
MONOS PCT: 13 %
Neutro Abs: 1.6 10*3/uL (ref 1.4–6.5)
Neutrophils Relative %: 42 %
Platelets: 307 10*3/uL (ref 150–440)
RBC: 3.58 MIL/uL — ABNORMAL LOW (ref 3.80–5.20)
RDW: 16.4 % — AB (ref 11.5–14.5)
WBC: 3.7 10*3/uL (ref 3.6–11.0)

## 2018-05-21 LAB — FERRITIN: Ferritin: 130 ng/mL (ref 11–307)

## 2018-06-04 ENCOUNTER — Inpatient Hospital Stay: Payer: Medicare Other | Attending: Hematology and Oncology

## 2018-06-04 DIAGNOSIS — E538 Deficiency of other specified B group vitamins: Secondary | ICD-10-CM | POA: Insufficient documentation

## 2018-06-04 DIAGNOSIS — D500 Iron deficiency anemia secondary to blood loss (chronic): Secondary | ICD-10-CM

## 2018-06-04 MED ORDER — CYANOCOBALAMIN 1000 MCG/ML IJ SOLN
1000.0000 ug | Freq: Once | INTRAMUSCULAR | Status: AC
  Administered 2018-06-04: 1000 ug via INTRAMUSCULAR
  Filled 2018-06-04: qty 1

## 2018-06-12 ENCOUNTER — Ambulatory Visit (INDEPENDENT_AMBULATORY_CARE_PROVIDER_SITE_OTHER): Payer: Medicare Other | Admitting: Gastroenterology

## 2018-06-12 ENCOUNTER — Encounter: Payer: Self-pay | Admitting: Gastroenterology

## 2018-06-12 VITALS — BP 108/65 | HR 89 | Ht 62.0 in | Wt 134.6 lb

## 2018-06-12 DIAGNOSIS — R112 Nausea with vomiting, unspecified: Secondary | ICD-10-CM

## 2018-06-12 DIAGNOSIS — Z980 Intestinal bypass and anastomosis status: Secondary | ICD-10-CM | POA: Diagnosis not present

## 2018-06-12 NOTE — Progress Notes (Signed)
Jocelyn Antigua, MD [address]  [place], [place] [address]  Main: [phone]  Fax: [phone]   Primary Care Physician: [name], MD  Primary Gastroenterologist:  Dr. [name]  Chief Complaint  Patient presents with  . Follow-up    intermittent n/v    HPI: Jocelyn Case is a 72 y.o. female here for follow-up of intermittent nausea and vomiting.  Patient with history of gastric ulcer and gastrectomy over 10 years ago in [place], [place].  Is on IV iron transfusions by hematology, due to history of iron deficiency due to her blood work to anatomy.  Pt. Underwent EGD in May 2019 with mild stenosis seen at Bilroth II anastomosis site and it was dilated to 13.52mm. Pt. Reported good response with resolution of Nausea/vomiting. Now symptoms have reoccured and pt has vomiting without eating, last episode was a week ago. No hematemesis.  Previous history: She recently underwent EGD in May 2019, due to intermittent nausea and vomiting.  The initial procedure showed large amount of fluid in the stomach, and the procedure had to be rescheduled for next day.  Her future procedures should always does be done with 2 days of clear liquid diet , and n.p.o. past midnight.  Mild stenosis was noted at the Billroth II anastomosis site, and through-the-scope dilator was used to dilate to 13.21mm.  Since the dilation, she reports much improved symptoms.  States she does not have any nausea or vomiting anymore.  Is eating small frequent meals without dysphagia, nausea or vomiting, or abdominal pain.  No altered bowel habits, blood in stool, melena.  Is also on PPI, due to acid reflux, and ulcerated mucosa seen at the anastomosis site, which she states has improved her symptoms as far as relieving her heartburn.  Findings: The examined esophagus was normal. 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 Retained fluid was found in the entire examined stomach. Removed  immediately after entering the stomach. No residual fluid or food left  after completely suctioning it. Few non-bleeding superficial gastric ulcers with no stigmata of bleeding  were found at the anastomosis. The largest lesion was 5 mm in largest  dimension. Biopsies were taken with a cold forceps for histology. 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 This was  characterized by mild stenosis and ulceration. A TTS dilator was passed  through the scope. Dilation with a 12-13.5-15 mm balloon dilator was  performed to 13.5 mm. The dilation site was examined and showed good  heme effect. A 2 to 4 mm sessile polyp with no bleeding was found in the jejunum.  Biopsies were taken with a cold forceps for histology. Exam of the jejunum was otherwise normal. Both limbs of the Bilroth II anatomy were intubated and appeared normal  with wide openings and no narrowing. Impression: - Normal esophagus. Biopsied. GE Junction at 36 cm - Retained gastric fluid. - Non-bleeding gastric ulcers at the anastomosis, with  no stigmata of bleeding. Biopsied. - A Billroth II anastomosis was found at 48 cm,  characterized by ulceration and mild stenosis. Dilated. - Jejunal polyp(s). Biopsied. These appeared to be  inflammatory polyps - Both limbs of the Bilroth II anatomy were intubated  and appeared normal with wide openings and no narrowing. Recommendation: - Take prescribed proton pump inhibitor or H2 blocker  (antacid) medications 30 - 60 minutes before  meals. -  Follow an antireflux regimen. - Await pathology results.  DIAGNOSIS:  A. POLYP, SMALL BOWEL; COLD BIOPSY:  - POLYPOID GRANULATION TISSUE.  - FOCAL AREA OF GASTRIC TYPE MUCOSA SEEN.  - NEGATIVE FOR INFECTIOUS AGENTS, DYSPLASIA AND MALIGNANCY.   B. ANASTOMOTIC ULCER; COLD BIOPSY:  - FRAGMENTS OF ULCERATION WITH FRAGMENTS OF MODERATELY INFLAMED GASTRIC  AND SMALL BOWEL MUCOSA.  - NEGATIVE FOR H. PYLORI, DYSPLASIA AND MALIGNANCY.   C. ESOPHAGUS; COLD BIOPSY:  - INFLAMED ESOPHAGEAL MUCOSA WITH SPONGIOSIS AND UP TO 5 EOSINOPHILS AND  A HIGH-POWER FIELD.  - NEGATIVE FOR DYSPLASIA AND MALIGNANCY.   Note: Changes in the esophagus are most consistent with reflux  esophagitis.   Previous history: As per notes, patient underwent endoscopy in August 2018 "revealing tight opening at the prior surgical area, mucosal ulceration and friable mucosa was detected, biopsy completed."It appears that the initial EGD could not be completed due to food in the stomach, and had to be repeated after 2 days of fasting.  Last colonoscopy date: 2015  Actual colonoscopy and EGD report not available. Above records obtained from clinic notes sent over from [place].  MRCP from February 2008: "Stable MRI of the abdomen and MRCP of the abdomen. There has been no change since prior examination from November 2007. No evidence of intra-or extrahepatic biliary duct dilation. No retained stone. Pancreatic duct appears within normal limits."  [facility]. 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  Their note also states"she does have  evidence of bacterial overgrowth and culture and this probably explains a steatorrhea documented on fecal fat testing."     Current Outpatient Medications  Medication Sig Dispense Refill  . cholecalciferol (VITAMIN D) 400 units TABS tablet Take 2,000 Units by mouth daily.    . colestipol (COLESTID) 1 g tablet Take 1 g by mouth.     . cyanocobalamin (,VITAMIN B-12,) 1000 MCG/ML injection Inject 1,000 mcg into the muscle every 30 (thirty) days.    . folic acid (FOLVITE) 099 MCG tablet Take 400 mcg by mouth daily.     . metoCLOPramide (REGLAN) 5 MG tablet Take 5 mg by mouth 2 (two) times daily.     . mirtazapine (REMERON) 15 MG tablet Take 15 mg by mouth at bedtime.     . pantoprazole (PROTONIX) 40 MG tablet TAKE 1 TABLET BY MOUTH TWICE DAILY, TAKE BEFORE A MEAL 180 tablet 1  . rOPINIRole (REQUIP) 0.25 MG tablet Take 0.25 mg by mouth at bedtime.     . promethazine (PHENERGAN) 25 MG tablet Take by mouth.     . sucralfate (CARAFATE) 1 g tablet Take 1 tablet (1 g total) by mouth 4 (four) times daily. 120 tablet 1   No current facility-administered medications for this visit.     Allergies as of 06/12/2018  . (No Known Allergies)    ROS:  General: Negative for anorexia, weight loss, fever, chills, fatigue, weakness. ENT: Negative for hoarseness, difficulty swallowing , nasal congestion. CV: Negative for chest pain, angina, palpitations, dyspnea on exertion, peripheral edema.  Respiratory: Negative for dyspnea at rest, dyspnea on exertion, cough, sputum, wheezing.  GI: See history of present illness. GU:  Negative for dysuria, hematuria, urinary incontinence, urinary frequency, nocturnal urination.  Endo: Negative for  unusual weight change.    Physical Examination:   BP 108/65   Pulse 89   Ht 5\' 2"  (1.575 m)   Wt 134 lb 9.6 oz (61.1 kg)   BMI 24.62 kg/m   General: Well-nourished, well-developed in no acute distress.  Eyes: No icterus. Conjunctivae pink. Mouth: Oropharyngeal  mucosa moist and pink , no lesions erythema or exudate. Neck: Supple, Trachea midline Abdomen: Bowel sounds are normal, nontender, nondistended, no hepatosplenomegaly or masses, no abdominal bruits or hernia , no rebound or guarding.   Extremities: No lower extremity edema. No clubbing or deformities. Neuro: Alert and oriented x 3.  Grossly intact. Skin: Warm and dry, no jaundice.   Psych: Alert and cooperative, normal mood and affect.   Labs: CMP     Component Value Date/Time   NA 137 11/05/2017 1141   K 3.8 11/05/2017 1141   CL 105 11/05/2017 1141   CO2 23 11/05/2017 1141   GLUCOSE 97 11/05/2017 1141   BUN 19 11/05/2017 1141   CREATININE 0.57 11/05/2017 1141   CALCIUM 8.3 (L) 11/05/2017 1141   PROT 5.5 (L) 11/05/2017 1141   ALBUMIN 2.7 (L) 11/05/2017 1141   AST 25 11/05/2017 1141   ALT 19 11/05/2017 1141   ALKPHOS 55 11/05/2017 1141   BILITOT 0.4 11/05/2017 1141   GFRNONAA >60 11/05/2017 1141   GFRAA >60 11/05/2017 1141   Lab Results  Component Value Date   WBC 3.7 05/21/2018   HGB 10.7 (L) 05/21/2018   HCT 33.5 (L) 05/21/2018   MCV 93.7 05/21/2018   PLT 307 05/21/2018    Imaging Studies: No results found.  Assessment and Plan:   Jocelyn Case is a 72 y.o. y/o female with history of Billroth II gastrectomy for gastric ulcer over 10 years ago, with intermittent nausea vomiting, due to mild stenosis at the Billroth II anastomosis, with dilation done to 13.5 mm in May 2019  Patient's previous EGDs outside of [place], and her last EGD showed residual food in the stomach despite fasting for 12 hours prior to the procedure. Her EGDs have required repeat the next day after further fasting This is all consistent with slow gastric emptying due to her stenosis seen at the Billroth II anastomosis  Since symptoms have recurred, will repeat EGD with dilation Also, ulcerated mucosa was seen at the anastomosis site last time and patient has been compliant with PPI and this  has also helped with her acid reflux However, her acid reflux is also reoccurring, likely due to food staying in her stomach longer  If mild stenosis still present, patient may need surgical evaluation to see if she needs revision of her anastomosis site  I have discussed alternative options, risks & benefits,  which include, but are not limited to, bleeding, infection, perforation,respiratory complication & drug reaction.  The patient agrees with this plan & written consent will be obtained.     Dr Jocelyn Case

## 2018-06-12 NOTE — Progress Notes (Signed)
Pt will need to check with daughter as to when she will be able to be with her for the EGD. Tenative dates given: 9/19, 9/26, 9/27. She will contact office. She will need to be fasting 2 days prior to EGD.

## 2018-06-14 ENCOUNTER — Telehealth: Payer: Self-pay | Admitting: Gastroenterology

## 2018-06-14 NOTE — Telephone Encounter (Signed)
PT LEFT VM  TO SPEAK WITH DEBBIE TO SCHEDULE A PROCEDURE

## 2018-06-14 NOTE — Telephone Encounter (Signed)
Pt left voicemail asking for Jackelyn Poling to call her on [phone]

## 2018-06-17 ENCOUNTER — Other Ambulatory Visit: Payer: Self-pay

## 2018-06-17 DIAGNOSIS — R112 Nausea with vomiting, unspecified: Secondary | ICD-10-CM

## 2018-06-17 DIAGNOSIS — R1319 Other dysphagia: Secondary | ICD-10-CM

## 2018-06-17 NOTE — Telephone Encounter (Signed)
EGD scheduled for 10/4 at [facility].

## 2018-06-19 ENCOUNTER — Telehealth: Payer: Self-pay | Admitting: Gastroenterology

## 2018-06-19 NOTE — Telephone Encounter (Signed)
Pt left vm for Debbie please call pt

## 2018-06-21 NOTE — Telephone Encounter (Signed)
lmtco

## 2018-06-21 NOTE — Telephone Encounter (Signed)
Pt left vm she states she left a vm Wednesday and no one has called her back she needs a call please

## 2018-06-24 ENCOUNTER — Telehealth: Payer: Self-pay

## 2018-06-24 ENCOUNTER — Telehealth: Payer: Self-pay | Admitting: Gastroenterology

## 2018-06-24 ENCOUNTER — Other Ambulatory Visit: Payer: Self-pay

## 2018-06-24 MED ORDER — PANTOPRAZOLE SODIUM 40 MG PO TBEC: 40.0000 mg | DELAYED_RELEASE_TABLET | Freq: Two times a day (BID) | ORAL | 1 refills | Status: DC

## 2018-06-24 NOTE — Telephone Encounter (Signed)
PT left vm she missed Debbies call

## 2018-06-24 NOTE — Telephone Encounter (Signed)
Pt came by office today wanting her Protonix refilled for 90 days at [facility] in [place]. Does not want to go through mail order. She also requested her promethazine be refilled but this was not prescribed by Dr. Bonna Gains and to contact her PCP for this. I have informed pt.

## 2018-06-24 NOTE — Telephone Encounter (Signed)
Pt came to office today wanting refill of Protonix and promethazine. She brought her bottles. I did tell her that I called her 8 min after she called and she said she got my message but her phone did not ring. Dr. Bonna Gains agreed to fill her protonix but the promethazine we have never prescribed for her so she will need to contact her PCP. Pt came back and picked up both empty bottles.

## 2018-06-25 ENCOUNTER — Telehealth: Payer: Self-pay | Admitting: Gastroenterology

## 2018-06-25 NOTE — Telephone Encounter (Signed)
Pt needs a medication authorized. Pls call

## 2018-06-26 ENCOUNTER — Other Ambulatory Visit: Payer: Self-pay | Admitting: Gastroenterology

## 2018-06-26 MED ORDER — ONDANSETRON HCL 4 MG PO TABS: 4.0000 mg | ORAL_TABLET | Freq: Two times a day (BID) | ORAL | 1 refills | Status: DC | PRN

## 2018-06-26 NOTE — Telephone Encounter (Signed)
Pt states Dr. Ouida Sills would not fill her phenergan, stating that due to it being a GI issue that her GI doctor should fill it. Pt states that it was originally from another doctor and was being filled by [facility] on a reorder. She is out and she was vomiting yesterday which has made her esophagus sore. If she needs an appointment she will come in. Pt states she takes phenergan twice daily. Could we fill this for her?

## 2018-06-26 NOTE — Telephone Encounter (Signed)
error 

## 2018-06-28 ENCOUNTER — Telehealth: Payer: Self-pay | Admitting: Gastroenterology

## 2018-06-28 NOTE — Telephone Encounter (Signed)
Pt is calling to inform Debbie she received her message and received her rx and is good for her procedure next week

## 2018-06-28 NOTE — Telephone Encounter (Signed)
Left detailed message in case pt does not get it until later. May contact office if needed.

## 2018-07-04 ENCOUNTER — Encounter: Payer: Self-pay | Admitting: Emergency Medicine

## 2018-07-05 ENCOUNTER — Ambulatory Visit: Payer: Medicare Other | Admitting: Registered Nurse

## 2018-07-05 ENCOUNTER — Ambulatory Visit
Admission: RE | Admit: 2018-07-05 | Discharge: 2018-07-05 | Disposition: A | Discharge status: Home / Self Care | Payer: Medicare Other | Source: Ambulatory Visit | Attending: Gastroenterology | Admitting: Gastroenterology

## 2018-07-05 ENCOUNTER — Encounter
Admission: RE | Disposition: A | Discharge status: Home / Self Care | Payer: Self-pay | Source: Ambulatory Visit | Attending: Gastroenterology

## 2018-07-05 ENCOUNTER — Other Ambulatory Visit: Payer: Self-pay

## 2018-07-05 DIAGNOSIS — K259 Gastric ulcer, unspecified as acute or chronic, without hemorrhage or perforation: Secondary | ICD-10-CM | POA: Insufficient documentation

## 2018-07-05 DIAGNOSIS — Z980 Intestinal bypass and anastomosis status: Secondary | ICD-10-CM | POA: Insufficient documentation

## 2018-07-05 DIAGNOSIS — G2581 Restless legs syndrome: Secondary | ICD-10-CM | POA: Insufficient documentation

## 2018-07-05 DIAGNOSIS — R1319 Other dysphagia: Secondary | ICD-10-CM

## 2018-07-05 DIAGNOSIS — K219 Gastro-esophageal reflux disease without esophagitis: Secondary | ICD-10-CM | POA: Diagnosis not present

## 2018-07-05 DIAGNOSIS — Z79899 Other long term (current) drug therapy: Secondary | ICD-10-CM | POA: Insufficient documentation

## 2018-07-05 DIAGNOSIS — R112 Nausea with vomiting, unspecified: Secondary | ICD-10-CM | POA: Diagnosis not present

## 2018-07-05 DIAGNOSIS — K253 Acute gastric ulcer without hemorrhage or perforation: Secondary | ICD-10-CM | POA: Diagnosis not present

## 2018-07-05 DIAGNOSIS — G4730 Sleep apnea, unspecified: Secondary | ICD-10-CM | POA: Insufficient documentation

## 2018-07-05 HISTORY — PX: ESOPHAGOGASTRODUODENOSCOPY (EGD) WITH PROPOFOL: SHX5813

## 2018-07-05 SURGERY — ESOPHAGOGASTRODUODENOSCOPY (EGD) WITH PROPOFOL
Anesthesia: General

## 2018-07-05 MED ORDER — LIDOCAINE HCL (CARDIAC) PF 100 MG/5ML IV SOSY
PREFILLED_SYRINGE | INTRAVENOUS | Status: DC | PRN
  Administered 2018-07-05: 40 mg via INTRAVENOUS

## 2018-07-05 MED ORDER — ONDANSETRON HCL 4 MG/2ML IJ SOLN
INTRAMUSCULAR | Status: AC
  Filled 2018-07-05: qty 2

## 2018-07-05 MED ORDER — ONDANSETRON HCL 4 MG/2ML IJ SOLN
INTRAMUSCULAR | Status: DC | PRN
  Administered 2018-07-05: 4 mg via INTRAVENOUS

## 2018-07-05 MED ORDER — SUCRALFATE 1 G PO TABS: 1.0000 g | ORAL_TABLET | Freq: Four times a day (QID) | ORAL | 0 refills | Status: DC

## 2018-07-05 MED ORDER — PROPOFOL 500 MG/50ML IV EMUL
INTRAVENOUS | Status: DC | PRN
  Administered 2018-07-05: 150 ug/kg/min via INTRAVENOUS

## 2018-07-05 MED ORDER — LIDOCAINE HCL (PF) 2 % IJ SOLN
INTRAMUSCULAR | Status: AC
  Filled 2018-07-05: qty 10

## 2018-07-05 MED ORDER — GLYCOPYRROLATE 0.2 MG/ML IJ SOLN
INTRAMUSCULAR | Status: DC | PRN
  Administered 2018-07-05: 0.2 mg via INTRAVENOUS

## 2018-07-05 MED ORDER — PROPOFOL 500 MG/50ML IV EMUL
INTRAVENOUS | Status: AC
  Filled 2018-07-05: qty 50

## 2018-07-05 MED ORDER — SODIUM CHLORIDE 0.9 % IV SOLN
INTRAVENOUS | Status: DC
  Administered 2018-07-05: 09:00:00 via INTRAVENOUS

## 2018-07-05 MED ORDER — PROPOFOL 10 MG/ML IV BOLUS
INTRAVENOUS | Status: DC | PRN
  Administered 2018-07-05: 80 mg via INTRAVENOUS

## 2018-07-05 MED ORDER — GLYCOPYRROLATE 0.2 MG/ML IJ SOLN
INTRAMUSCULAR | Status: AC
  Filled 2018-07-05: qty 1

## 2018-07-05 NOTE — Anesthesia Post-op Follow-up Note (Signed)
Anesthesia QCDR form completed.        

## 2018-07-05 NOTE — Anesthesia Preprocedure Evaluation (Signed)
Anesthesia Evaluation  Patient identified by MRN, date of birth, ID band Patient awake    Reviewed: Allergy & Precautions, H&P , NPO status , Patient's Chart, lab work & pertinent test results  History of Anesthesia Complications (+) PONVNegative for: history of anesthetic complications  Airway Mallampati: III  TM Distance: <3 FB Neck ROM: limited    Dental  (+) Chipped, Poor Dentition   Pulmonary sleep apnea ,           Cardiovascular Exercise Tolerance: Good (-) angina(-) Past MI and (-) DOE negative cardio ROS       Neuro/Psych  Headaches, negative psych ROS   GI/Hepatic Neg liver ROS, PUD, GERD  Medicated and Controlled,  Endo/Other  negative endocrine ROS  Renal/GU negative Renal ROS  negative genitourinary   Musculoskeletal   Abdominal   Peds  Hematology negative hematology ROS (+)   Anesthesia Other Findings Past Medical History: No date: Anemia No date: Blood transfusion without reported diagnosis No date: GERD (gastroesophageal reflux disease) 2015: Restless leg syndrome     Comment:  R leg   Past Surgical History: No date: ABDOMINAL HYSTERECTOMY No date: APPENDECTOMY No date: CHOLECYSTECTOMY 2 years ago: COLONOSCOPY 2013: STOMACH SURGERY 05/2017: UPPER GASTROINTESTINAL ENDOSCOPY     Comment:  pt stated "bleeding ulcer and stopped up duodenum was               noted"  BMI    Body Mass Index:  24.69 kg/m      Reproductive/Obstetrics negative OB ROS                             Anesthesia Physical  Anesthesia Plan  ASA: III  Anesthesia Plan: General   Post-op Pain Management:    Induction: Intravenous  PONV Risk Score and Plan: Propofol infusion and TIVA  Airway Management Planned: Natural Airway and Nasal Cannula  Additional Equipment:   Intra-op Plan:   Post-operative Plan:   Informed Consent: I have reviewed the patients History and Physical,  chart, labs and discussed the procedure including the risks, benefits and alternatives for the proposed anesthesia with the patient or authorized representative who has indicated his/her understanding and acceptance.   Dental Advisory Given  Plan Discussed with: Anesthesiologist, CRNA and Surgeon  Anesthesia Plan Comments: (Patient consented for risks of anesthesia including but not limited to:  - adverse reactions to medications - risk of intubation if required - damage to teeth, lips or other oral mucosa - sore throat or hoarseness - Damage to heart, brain, lungs or loss of life  Patient voiced understanding.)        Anesthesia Quick Evaluation

## 2018-07-05 NOTE — Transfer of Care (Addendum)
Immediate Anesthesia Transfer of Care Note  Patient: Jocelyn Case  Procedure(s) Performed: Procedure(s): ESOPHAGOGASTRODUODENOSCOPY (EGD) WITH PROPOFOL (N/A)  Patient Location: PACU and Endoscopy Unit  Anesthesia Type:General  Level of Consciousness: sedated  Airway & Oxygen Therapy: Patient Spontanous Breathing and Patient connected to nasal cannula oxygen  Post-op Assessment: Report given to RN and Post -op Vital signs reviewed and stable  Post vital signs: Reviewed and stable  Last Vitals:  Vitals:   07/05/18 0938 07/05/18 0940  BP: 133/76   Pulse:  (!) 102  Resp: 20 17  Temp: 36.7 C   SpO2: 75% 051%    Complications: No apparent anesthesia complications

## 2018-07-05 NOTE — Op Note (Signed)
[facility] Patient Name: Jocelyn Case Procedure Date: 07/05/2018 9:06 AM MRN: [id number] Account #: [id number] Date of Birth: August 23, 1946 Admit Type: Outpatient Age: 72 Room: [facility] ENDO ROOM 2 Gender: Female Note Status: Finalized Procedure:            Upper GI endoscopy Indications:          Nausea with vomiting Providers:            Gerard Cantara B. Bonna Gains MD, MD Referring MD:         Ocie Cornfield. Ouida Sills MD, MD (Referring MD) Medicines:            Monitored Anesthesia Care Complications:        No immediate complications. Procedure:            Pre-Anesthesia Assessment:                       - The risks and benefits of the procedure and the                        sedation options and risks were discussed with the                        patient. All questions were answered and informed                        consent was obtained.                       - Patient identification and proposed procedure were                        verified prior to the procedure.                       - ASA Grade Assessment: II - A patient with mild                        systemic disease.                       After obtaining informed consent, the endoscope was                        passed under direct vision. Throughout the procedure,                        the patient's blood pressure, pulse, and oxygen                        saturations were monitored continuously. The Endoscope                        was introduced through the mouth, and advanced to the                        jejunum. The upper GI endoscopy was accomplished with                        ease. The patient tolerated the procedure well. Findings:      The examined esophagus was normal.  A small amount of food (residue) was found in the gastric body.      One non-bleeding superficial gastric ulcer with no stigmata of bleeding       was found at the anastomosis. The lesion was 6 mm in largest  dimension.      The examined jejunum was normal.      Evidence of a patent Billroth II gastrojejunostomy was found. The       gastrojejunal anastomosis was characterized by mild stenosis and       ulceration. The efferent limb was examined. The afferent limb was       examined. A TTS dilator was passed through the scope. Dilation with a       15-16.5-18 mm anastomotic balloon dilator was performed. The dilation       site was examined and showed moderate improvement in luminal narrowing.       Maximal size of dilation was 16.5 mm after which good heme effect was       noted at the site as expected. Impression:           - Normal esophagus.                       - A small amount of food (residue) in the stomach.                       - Non-bleeding gastric ulcer with no stigmata of                        bleeding.                       - Normal examined jejunum.                       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 Dilated.                       - No specimens collected. Recommendation:       - Discharge patient to home.                       - Take prescribed proton pump inhibitor or H2 blocker                        (antacid) medications 30 - 60 minutes before meals.                       - Use sucralfate suspension 1 gram PO QID.                       - Return to my office in 2 weeks.                       - Return to primary care physician in 4 weeks.                       - Small frequent meals encouraged  Avoid eating 3 hrs before bedtime                       Use a bed wedge at night                       - Follow an antireflux regimen.                       - The findings and recommendations were discussed with                        the patient.                       - The findings and recommendations were discussed with                        the patient's  family. Procedure Code(s):    --- Professional ---                       [phone], Esophagogastroduodenoscopy, flexible, transoral;                        with dilation of gastric/duodenal stricture(s) (eg,                        balloon, bougie) Diagnosis Code(s):    --- Professional ---                       K25.9, Gastric ulcer, unspecified as acute or chronic,                        without hemorrhage or perforation                       Z98.0, Intestinal bypass and anastomosis status                       R11.2, Nausea with vomiting, unspecified CPT copyright 2017 American Medical Association. All rights reserved. The codes documented in this report are preliminary and upon coder review may  be revised to meet current compliance requirements.  [name], MD Margretta Sidle B. Bonna Gains MD, MD 07/05/2018 9:39:13 AM This report has been signed electronically. Number of Addenda: 0 Note Initiated On: 07/05/2018 9:06 AM Estimated Blood Loss: Estimated blood loss: none.      [facility]

## 2018-07-05 NOTE — Anesthesia Procedure Notes (Signed)
Date/Time: 07/05/2018 9:16 AM Performed by: Doreen Salvage, CRNA Pre-anesthesia Checklist: Patient identified, Emergency Drugs available, Suction available and Patient being monitored Patient Re-evaluated:Patient Re-evaluated prior to induction Oxygen Delivery Method: Nasal cannula Induction Type: IV induction Dental Injury: Teeth and Oropharynx as per pre-operative assessment  Comments: Nasal cannula with etCO2 monitoring

## 2018-07-05 NOTE — H&P (Signed)
[name], MD [address], [place], [place], [place], [address] [id number], [place], [place], [place], [address] Phone: [phone]  Fax: [phone]  Primary Care Physician:  [name], MD   Pre-Procedure History & Physical: HPI:  Jocelyn Case is a 72 y.o. female is here for an EGD.   Past Medical History:  Diagnosis Date  . Anemia   . Blood transfusion without reported diagnosis   . Complication of anesthesia   . GERD (gastroesophageal reflux disease)   . PONV (postoperative nausea and vomiting)   . Restless leg syndrome 2015   R leg   . Sleep apnea     Past Surgical History:  Procedure Laterality Date  . ABDOMINAL HYSTERECTOMY    . APPENDECTOMY    . CHOLECYSTECTOMY    . COLONOSCOPY  2 years ago  . ESOPHAGOGASTRODUODENOSCOPY (EGD) WITH PROPOFOL N/A 02/14/2018   Procedure: ESOPHAGOGASTRODUODENOSCOPY (EGD) WITH PROPOFOL;  Surgeon: Virgel Manifold, MD;  Location: [facility] ENDOSCOPY;  Service: Endoscopy;  Laterality: N/A;  . ESOPHAGOGASTRODUODENOSCOPY (EGD) WITH PROPOFOL N/A 02/15/2018   Procedure: ESOPHAGOGASTRODUODENOSCOPY (EGD) WITH PROPOFOL;  Surgeon: Virgel Manifold, MD;  Location: [facility] ENDOSCOPY;  Service: Endoscopy;  Laterality: N/A;  . STOMACH SURGERY  2013  . UPPER GASTROINTESTINAL ENDOSCOPY  05/2017   pt stated "bleeding ulcer and stopped up duodenum was noted"    Prior to Admission medications   Medication Sig Start Date End Date Taking? Authorizing Provider  cholecalciferol (VITAMIN D) 400 units TABS tablet Take 2,000 Units by mouth daily.   Yes [provider]  colestipol (COLESTID) 1 g tablet Take 1 g by mouth.  07/31/17 07/31/18 Yes [provider]  cyanocobalamin (,VITAMIN B-12,) 1000 MCG/ML injection Inject 1,000 mcg into the muscle every 30 (thirty) days.   Yes [provider]  folic acid (FOLVITE) 644 MCG tablet Take 400 mcg by mouth daily.  07/31/17  Yes [provider]  mirtazapine  (REMERON) 15 MG tablet Take 15 mg by mouth at bedtime.  07/31/17 07/31/18 Yes [provider]  ondansetron (ZOFRAN) 4 MG tablet Take 1 tablet (4 mg total) by mouth 2 (two) times daily as needed for nausea or vomiting. 06/26/18  Yes Virgel Manifold, MD  pantoprazole (PROTONIX) 40 MG tablet Take 1 tablet (40 mg total) by mouth 2 (two) times daily before a meal. 06/24/18  Yes Yaseen Gilberg, Margretta Sidle B, MD  rOPINIRole (REQUIP) 0.25 MG tablet Take 0.25 mg by mouth at bedtime.  07/31/17 07/31/18 Yes [provider]  sucralfate (CARAFATE) 1 g tablet Take 1 tablet (1 g total) by mouth 4 (four) times daily. 02/15/18 03/17/18  Virgel Manifold, MD    Allergies as of 06/17/2018  . (No Known Allergies)    Family History  Problem Relation Age of Onset  . Peptic Ulcer Disease Mother   . Dementia Mother   . COPD Father   . Colon cancer Father     Social History   Socioeconomic History  . Marital status: Widowed    Spouse name: Not on file  . Number of children: Not on file  . Years of education: Not on file  . Highest education level: Not on file  Occupational History  . Not on file  Social Needs  . Financial resource strain: Not on file  . Food insecurity:    Worry: Not on file    Inability: Not on file  . Transportation needs:    Medical: Not on file    Non-medical: Not  on file  Tobacco Use  . Smoking status: Never Smoker  . Smokeless tobacco: Never Used  Substance and Sexual Activity  . Alcohol use: Yes    Comment: drinks wine with dinner  . Drug use: No  . Sexual activity: Never  Lifestyle  . Physical activity:    Days per week: Not on file    Minutes per session: Not on file  . Stress: Not on file  Relationships  . Social connections:    Talks on phone: Not on file    Gets together: Not on file    Attends religious service: Not on file    Active member of club or organization: Not on file    Attends meetings of clubs or organizations: Not on file     Relationship status: Not on file  . Intimate partner violence:    Fear of current or ex partner: Not on file    Emotionally abused: Not on file    Physically abused: Not on file    Forced sexual activity: Not on file  Other Topics Concern  . Not on file  Social History Narrative  . Not on file    Review of Systems: See HPI, otherwise negative ROS  Physical Exam: BP 138/69   Pulse 96   Temp (!) 97.4 F (36.3 C) (Tympanic)   Resp 16   Ht 5\' 2"  (1.575 m)   Wt 59 kg   SpO2 96%   BMI 23.78 kg/m  General:   Alert,  pleasant and cooperative in NAD Head:  Normocephalic and atraumatic. Neck:  Supple; no masses or thyromegaly. Lungs:  Clear throughout to auscultation, normal respiratory effort.    Heart:  +S1, +S2, Regular rate and rhythm, No edema. Abdomen:  Soft, nontender and nondistended. Normal bowel sounds, without guarding, and without rebound.   Neurologic:  Alert and  oriented x4;  grossly normal neurologically.  Impression/Plan: Jocelyn Case is here for an EGD for nausea/vomiting  Risks, benefits, limitations, and alternatives regarding the procedure have been reviewed with the patient.  Questions have been answered.  All parties agreeable.   Virgel Manifold, MD  07/05/2018, 9:17 AM

## 2018-07-06 ENCOUNTER — Encounter: Payer: Self-pay | Admitting: Gastroenterology

## 2018-07-07 NOTE — Anesthesia Postprocedure Evaluation (Signed)
Anesthesia Post Note  Patient: Jocelyn Case  Procedure(s) Performed: ESOPHAGOGASTRODUODENOSCOPY (EGD) WITH PROPOFOL (N/A )  Patient location during evaluation: Endoscopy Anesthesia Type: General Level of consciousness: awake and alert Pain management: pain level controlled Vital Signs Assessment: post-procedure vital signs reviewed and stable Respiratory status: spontaneous breathing, nonlabored ventilation, respiratory function stable and patient connected to nasal cannula oxygen Cardiovascular status: blood pressure returned to baseline and stable Postop Assessment: no apparent nausea or vomiting Anesthetic complications: no     Last Vitals:  Vitals:   07/05/18 0940 07/05/18 0950  BP: 135/76 139/76  Pulse: (!) 102 100  Resp: 17 (!) 21  Temp:    SpO2: 100% 97%    Last Pain:  Vitals:   07/05/18 0938  TempSrc: Tympanic                 Martha Clan

## 2018-07-10 ENCOUNTER — Inpatient Hospital Stay: Payer: Medicare Other | Attending: Hematology and Oncology

## 2018-07-10 ENCOUNTER — Other Ambulatory Visit: Payer: Self-pay

## 2018-07-10 ENCOUNTER — Telehealth: Payer: Self-pay

## 2018-07-10 DIAGNOSIS — D509 Iron deficiency anemia, unspecified: Secondary | ICD-10-CM | POA: Insufficient documentation

## 2018-07-10 DIAGNOSIS — D500 Iron deficiency anemia secondary to blood loss (chronic): Secondary | ICD-10-CM

## 2018-07-10 DIAGNOSIS — Z23 Encounter for immunization: Secondary | ICD-10-CM | POA: Diagnosis not present

## 2018-07-10 DIAGNOSIS — E538 Deficiency of other specified B group vitamins: Secondary | ICD-10-CM | POA: Diagnosis not present

## 2018-07-10 LAB — CBC WITH DIFFERENTIAL/PLATELET
ABS IMMATURE GRANULOCYTES: 0.06 10*3/uL (ref 0.00–0.07)
BASOS ABS: 0 10*3/uL (ref 0.0–0.1)
BASOS PCT: 0 %
EOS ABS: 0.3 10*3/uL (ref 0.0–0.5)
Eosinophils Relative: 3 %
HCT: 34.7 % — ABNORMAL LOW (ref 36.0–46.0)
Hemoglobin: 10.2 g/dL — ABNORMAL LOW (ref 12.0–15.0)
IMMATURE GRANULOCYTES: 1 %
LYMPHS ABS: 0.8 10*3/uL (ref 0.7–4.0)
Lymphocytes Relative: 10 %
MCH: 27.9 pg (ref 26.0–34.0)
MCHC: 29.4 g/dL — ABNORMAL LOW (ref 30.0–36.0)
MCV: 95.1 fL (ref 80.0–100.0)
Monocytes Absolute: 0.4 10*3/uL (ref 0.1–1.0)
Monocytes Relative: 5 %
NEUTROS ABS: 6.9 10*3/uL (ref 1.7–7.7)
NEUTROS PCT: 81 %
NRBC: 0 % (ref 0.0–0.2)
PLATELETS: 373 10*3/uL (ref 150–400)
RBC: 3.65 MIL/uL — ABNORMAL LOW (ref 3.87–5.11)
RDW: 13.4 % (ref 11.5–15.5)
WBC: 8.5 10*3/uL (ref 4.0–10.5)

## 2018-07-10 LAB — FERRITIN: Ferritin: 132 ng/mL (ref 11–307)

## 2018-07-10 NOTE — Telephone Encounter (Signed)
Left message that [facility] sent and they should be contacting her in 1-2 weeks. If she has not heard anything by then to contact office.

## 2018-07-10 NOTE — Telephone Encounter (Signed)
-----   Message from Virgel Manifold, MD sent at 07/05/2018  9:48 AM EDT ----- Please refer patient to [facility] "History of Bilroth II, Nausea/vomiting, stenosis at anastomosis site requiring dilation, evaluate for revision". Send them my clinic notes and EGD reports.

## 2018-07-11 ENCOUNTER — Encounter: Payer: Self-pay | Admitting: Hematology and Oncology

## 2018-07-11 ENCOUNTER — Inpatient Hospital Stay (HOSPITAL_BASED_OUTPATIENT_CLINIC_OR_DEPARTMENT_OTHER): Payer: Medicare Other | Admitting: Hematology and Oncology

## 2018-07-11 ENCOUNTER — Inpatient Hospital Stay: Payer: Medicare Other

## 2018-07-11 VITALS — BP 110/60 | HR 83 | Temp 97.5°F | Resp 18 | Ht 62.0 in | Wt 132.2 lb

## 2018-07-11 DIAGNOSIS — D500 Iron deficiency anemia secondary to blood loss (chronic): Secondary | ICD-10-CM

## 2018-07-11 DIAGNOSIS — Z23 Encounter for immunization: Secondary | ICD-10-CM

## 2018-07-11 DIAGNOSIS — E538 Deficiency of other specified B group vitamins: Secondary | ICD-10-CM

## 2018-07-11 DIAGNOSIS — D509 Iron deficiency anemia, unspecified: Secondary | ICD-10-CM | POA: Diagnosis not present

## 2018-07-11 DIAGNOSIS — R634 Abnormal weight loss: Secondary | ICD-10-CM

## 2018-07-11 MED ORDER — CYANOCOBALAMIN 1000 MCG/ML IJ SOLN
1000.0000 ug | Freq: Once | INTRAMUSCULAR | Status: AC
  Administered 2018-07-11: 1000 ug via INTRAMUSCULAR
  Filled 2018-07-11: qty 1

## 2018-07-11 MED ORDER — INFLUENZA VAC SPLIT HIGH-DOSE 0.5 ML IM SUSY
0.5000 mL | PREFILLED_SYRINGE | Freq: Once | INTRAMUSCULAR | Status: AC
  Administered 2018-07-11: 0.5 mL via INTRAMUSCULAR
  Filled 2018-07-11: qty 0.5

## 2018-07-11 NOTE — Progress Notes (Signed)
No new changes noted today 

## 2018-07-11 NOTE — Progress Notes (Signed)
[facility] day:  07/11/2018   Chief Complaint: Jocelyn Case is a 72 y.o. female s/p partial gastrectomy and low iron who is seen for assessment after interval IV iron.  HPI: The patient was last seen in the hematology clinic on 01/04/2018.  At that time, she was feeling better after initiation of IV iron.  She rescheduled her EGD for sometime in 03/2018.  Exam was unremarkable.  Hemoglobin was 10.9. Ferritin was 71.  EGD on 02/14/2018 by Dr. Bonna Gains revealed LA Grade B reflux esophagitis.  There was a large amount of food (residue) in the stomach.    EGD on  02/15/2018 revealed non-bleeding gastric ulcers at the anastomosis, with no stigmata of bleeding. Biopsied.  A Billroth II anastomosis was found at 48 cm, characterized by ulceration and mild stenosis. Dilated.  Jejunal polyp(s). Biopsied. These appeared to be inflammatory polyps.  Both limbs of the Bilroth II anatomy were intubated and appeared normal with wide openings and no narrowing.  Pathology revealed no evidence of malignancy.  EGD on 07/05/2018 revealed a normal esophagus.  There was a small amount of food (residue) in the stomach.  There was non-bleeding gastric ulcer with no stigmata of bleeding.  Normal examined jejunum.  Patent Billroth II gastrojejunostomy was found, characterized by ulceration and mild stenosis. Dilated  She has received monthly B12 (last 06/04/2018).  CBC on 04/08/2018 revealed a hematocrit of 32.2, hemoglobin 10.3, and MCV 89.9.  Ferritin was 12.  She received weekly Venofer x 2 (04/16/2018 - 04/23/2018).  CBC on 07/10/2018 revealed a hematocrit of 34.7, hemoglobin 10.2, MCV 95.1.  Ferritin was 132.    During the interim, patient describes herself as being "super lazy".  Energy level remains low overall.  Since her last visit, patient has experienced episodes of nausea.  She has been seen by her PCP and prescribed oral ondansetron, which she notes has been  effective in controlling her symptoms. Patient denies that she has experienced any B symptoms. She denies any interval infections.   Patient advises that she maintains an adequate appetite. She is eating well. Weight today is 132 lb 3.2 oz (60 kg), which compared to her last visit to the clinic, represents a 9 pound weight loss.  Patient denies pain in the clinic today.   Past Medical History:  Diagnosis Date  . Anemia   . Blood transfusion without reported diagnosis   . Complication of anesthesia   . GERD (gastroesophageal reflux disease)   . PONV (postoperative nausea and vomiting)   . Restless leg syndrome 2015   R leg   . Sleep apnea     Past Surgical History:  Procedure Laterality Date  . ABDOMINAL HYSTERECTOMY    . APPENDECTOMY    . CHOLECYSTECTOMY    . COLONOSCOPY  2 years ago  . ESOPHAGOGASTRODUODENOSCOPY (EGD) WITH PROPOFOL N/A 02/14/2018   Procedure: ESOPHAGOGASTRODUODENOSCOPY (EGD) WITH PROPOFOL;  Surgeon: Virgel Manifold, MD;  Location: [facility] ENDOSCOPY;  Service: Endoscopy;  Laterality: N/A;  . ESOPHAGOGASTRODUODENOSCOPY (EGD) WITH PROPOFOL N/A 02/15/2018   Procedure: ESOPHAGOGASTRODUODENOSCOPY (EGD) WITH PROPOFOL;  Surgeon: Virgel Manifold, MD;  Location: [facility] ENDOSCOPY;  Service: Endoscopy;  Laterality: N/A;  . ESOPHAGOGASTRODUODENOSCOPY (EGD) WITH PROPOFOL N/A 07/05/2018   Procedure: ESOPHAGOGASTRODUODENOSCOPY (EGD) WITH PROPOFOL;  Surgeon: Virgel Manifold, MD;  Location: [facility] ENDOSCOPY;  Service: Endoscopy;  Laterality: N/A;  . STOMACH SURGERY  2013  . UPPER GASTROINTESTINAL ENDOSCOPY  05/2017   pt stated "bleeding ulcer and  stopped up duodenum was noted"    Family History  Problem Relation Age of Onset  . Peptic Ulcer Disease Mother   . Dementia Mother   . COPD Father   . Colon cancer Father     Social History:  reports that she has never smoked. She has never used smokeless tobacco. She reports that she drinks alcohol. She reports that she does  not use drugs.  She drinks wine in the evening.  She previously lived in [place].  She moved to [place] in 05/2017.  Her daughter lives in [place].  She is temporarily living in an apartment in [place].  The patient is alone today.  Allergies: No Known Allergies  Current Medications: Current Outpatient Medications  Medication Sig Dispense Refill  . cholecalciferol (VITAMIN D) 400 units TABS tablet Take 2,000 Units by mouth daily.    . colestipol (COLESTID) 1 g tablet Take 1 g by mouth.     . cyanocobalamin (,VITAMIN B-12,) 1000 MCG/ML injection Inject 1,000 mcg into the muscle every 30 (thirty) days.    . folic acid (FOLVITE) 814 MCG tablet Take 400 mcg by mouth daily.     . mirtazapine (REMERON) 15 MG tablet Take 15 mg by mouth at bedtime.     . pantoprazole (PROTONIX) 40 MG tablet Take 1 tablet (40 mg total) by mouth 2 (two) times daily before a meal. 180 tablet 1  . rOPINIRole (REQUIP) 0.25 MG tablet Take 0.25 mg by mouth at bedtime.     . sucralfate (CARAFATE) 1 g tablet Take 1 tablet (1 g total) by mouth 4 (four) times daily. 120 tablet 0  . ondansetron (ZOFRAN) 4 MG tablet Take 1 tablet (4 mg total) by mouth 2 (two) times daily as needed for nausea or vomiting. (Patient not taking: Reported on 07/11/2018) 30 tablet 1   No current facility-administered medications for this visit.     Review of Systems  Constitutional: Positive for malaise/fatigue. Negative for diaphoresis, fever and weight loss (down 9 pounds).       "I have been super lazy".  Energy low.  HENT: Positive for hearing loss (hearing aides).   Eyes: Negative.   Respiratory: Negative for cough, hemoptysis, sputum production and shortness of breath.   Cardiovascular: Negative for chest pain, palpitations, orthopnea, leg swelling and PND.  Gastrointestinal: Positive for heartburn (controlled) and nausea (controlled with ondansetron). Negative for abdominal pain, blood in stool, constipation, diarrhea, melena  and vomiting.  Genitourinary: Negative for dysuria, frequency, hematuria and urgency.  Musculoskeletal: Negative for back pain, falls, joint pain and myalgias.  Skin: Negative for itching and rash.  Neurological: Negative for dizziness, tremors, weakness and headaches.       Chronic restless legs; stable  Endo/Heme/Allergies: Does not bruise/bleed easily.  Psychiatric/Behavioral: Negative for depression, memory loss and suicidal ideas. The patient is not nervous/anxious and does not have insomnia.   All other systems reviewed and are negative.  Performance status (ECOG): 1 - Symptomatic but completely ambulatory  Vital Signs BP 110/60 (BP Location: Right Arm, Patient Position: Sitting)   Pulse 83   Temp (!) 97.5 F (36.4 C) (Tympanic)   Resp 18   Ht 5\' 2"  (1.575 m)   Wt 132 lb 3.2 oz (60 kg)   SpO2 93%   BMI 24.18 kg/m   Physical Exam  Constitutional: She is oriented to person, place, and time and well-developed, well-nourished, and in no distress. No distress.  HENT:  Head: Normocephalic and atraumatic.  Mouth/Throat:  No oropharyngeal exudate.  Short graying brown hair. Hearing aides.  Eyes: Pupils are equal, round, and reactive to light. Conjunctivae and EOM are normal. No scleral icterus.  Blue eyes.  Neck: Normal range of motion. Neck supple. No JVD present.  Cardiovascular: Normal rate, regular rhythm and normal heart sounds. Exam reveals no gallop and no friction rub.  No murmur heard. Pulmonary/Chest: Effort normal and breath sounds normal. No respiratory distress. She has no wheezes. She has no rales.  Abdominal: Soft. Bowel sounds are normal. She exhibits no distension and no mass. There is no tenderness. There is no rebound and no guarding.  Musculoskeletal: Normal range of motion. She exhibits no edema or tenderness.  Lymphadenopathy:    She has no cervical adenopathy.  Neurological: She is alert and oriented to person, place, and time. Gait normal.  Skin: Skin is  warm and dry. No rash noted. She is not diaphoretic. No erythema.  Psychiatric: Mood, affect and judgment normal.  Nursing note and vitals reviewed.   Appointment on 07/10/2018  Component Date Value Ref Range Status  . Ferritin 07/10/2018 132  11 - 307 ng/mL Final   Performed at [facility], [address] [place], [place] [address]  . WBC 07/10/2018 8.5  4.0 - 10.5 K/uL Final  . RBC 07/10/2018 3.65* 3.87 - 5.11 MIL/uL Final  . Hemoglobin 07/10/2018 10.2* 12.0 - 15.0 g/dL Final  . HCT 07/10/2018 34.7* 36.0 - 46.0 % Final  . MCV 07/10/2018 95.1  80.0 - 100.0 fL Final  . MCH 07/10/2018 27.9  26.0 - 34.0 pg Final  . MCHC 07/10/2018 29.4* 30.0 - 36.0 g/dL Final  . RDW 07/10/2018 13.4  11.5 - 15.5 % Final  . Platelets 07/10/2018 373  150 - 400 K/uL Final  . nRBC 07/10/2018 0.0  0.0 - 0.2 % Final  . Neutrophils Relative % 07/10/2018 81  % Final  . Neutro Abs 07/10/2018 6.9  1.7 - 7.7 K/uL Final  . Lymphocytes Relative 07/10/2018 10  % Final  . Lymphs Abs 07/10/2018 0.8  0.7 - 4.0 K/uL Final  . Monocytes Relative 07/10/2018 5  % Final  . Monocytes Absolute 07/10/2018 0.4  0.1 - 1.0 K/uL Final  . Eosinophils Relative 07/10/2018 3  % Final  . Eosinophils Absolute 07/10/2018 0.3  0.0 - 0.5 K/uL Final  . Basophils Relative 07/10/2018 0  % Final  . Basophils Absolute 07/10/2018 0.0  0.0 - 0.1 K/uL Final  . Immature Granulocytes 07/10/2018 1  % Final  . Abs Immature Granulocytes 07/10/2018 0.06  0.00 - 0.07 K/uL Final   Performed at [facility], [address] [place],  [id number]    Assessment:  Jocelyn Case is a 72 y.o. female s/p partial gastrectomy in 2009 and subsequent iron deficiency anemia.  She previously received IV iron off/on every 6 months for the past 3 years while living in [place].  She is intolerant of oral iron.  Work-up on 11/23/2017 revealed a hematocrit of 31, hemoglobin 9.6, MCV 84.7, platelets 379,000, WBC 5600 with an ANC of 3200.  Ferritin was  7.  B12 was 267.  Folate was 28.  She received Venofer weekly x 4 (11/30/2017 - 12/21/2017) and x 2 (04/16/2018 - 04/23/2018).  Ferritin has been followed: 5 on 11/05/2017, 7 on 11/23/2017, 71 on 01/04/2018, 45 on 02/15/2018, 12 on 04/08/2018, 130 on 05/21/2018, and 132 on 07/10/2018.  She receives B12 IM monthly (last 06/04/2018).  She is on folic acid.  Folate  was 28 on 11/23/2017.  EGD on  02/15/2018 revealed non-bleeding gastric ulcers at the anastomosis, with no stigmata of bleeding. Biopsied.  A Billroth II anastomosis characterized by ulceration and mild stenosis was dilated.  Jejunal polyp(s) were biopsied. Pathology revealed no evidence of malignancy.  EGD on 07/05/2018 revealed non-bleeding gastric ulcer with no stigmata of bleeding.  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  She is followed by Dr. [name].  Symptomatically, she notes that her energy remains low.  She has been "super lazy" as of late.  Patient with episodes of nausea, reported to be controlled with the use of ondansetron.  Exam is grossly unremarkable.  Hemoglobin 10.2, hematocrit 34.7, MCV 95.1, and platelets 273,000.  Ferritin is normal at 132.  Plan: 1. Review interval labs. 2. Iron deficency anemia  Discuss interval EGD.  Hemoglobin 10.2, hematocrit 34.7, MCV 95.1.  Ferritin 132.   Review ferritin goal of > 30.   No additional IV iron required today. 3. B12 deficiency  Continues on monthly parenteral B12 supplementation.  B12 today and monthly.  Check folate annually (next 11/23/2018). 4. Influenza prophylaxis  Patient requesting annual influenza vaccination. Discussed risks and benefits of vaccination. Blood counts reviewed and found to be adequate enough for patient to proceed with vaccination. Orders placed for Fluzone high dose vaccination to be administered today in clinic 5. RTC in 3 months for MD assessment, labs (CBC with diff, ferritin - day  before), and +/- Venofer.    Honor Loh, NP  07/11/2018, 1:52 PM   I saw and evaluated the patient, participating in the key portions of the service and reviewing pertinent diagnostic studies and records.  I reviewed the nurse practitioner's note and agree with the findings and the plan.  The assessment and plan were discussed with the patient. A few questions were asked by the patient and answered.   Nolon Stalls, MD 07/11/2018,1:52 PM

## 2018-07-18 ENCOUNTER — Other Ambulatory Visit: Payer: Self-pay | Admitting: Gastroenterology

## 2018-07-22 ENCOUNTER — Telehealth: Payer: Self-pay | Admitting: Gastroenterology

## 2018-07-22 NOTE — Telephone Encounter (Signed)
I spoke with pt and she would like her (1) Zofran refilled for a #90 supply, last refill was on 10/7 and states she will be out before Wednesday (given #30 tabs and this is ordered 2xd prn) and this medication helps her a lot; (2) when her EGD was done on 10/4 she was to have a follow up appt for 2 wks and she was told that an appt was not available until 11/14 so she took that appt. When I asked pt if she was doing okay, she states "yes, as long as she has the Zofran to take". (3) Referral to [facility]. This was sent out on 07/10/2018 and pt states she has not heard anything. I informed pt that I would check on this and if she has not heard anything by Friday 12 noon from myself or [facility] to contact the office.  Pt agreed.

## 2018-07-22 NOTE — Telephone Encounter (Signed)
Pt left vm she needs to speak to someone regarding 3 different things she needs to speak with  Jackelyn Poling or the office manager

## 2018-07-23 ENCOUNTER — Other Ambulatory Visit: Payer: Self-pay

## 2018-07-23 MED ORDER — ONDANSETRON HCL 4 MG PO TABS: 4.0000 mg | ORAL_TABLET | Freq: Two times a day (BID) | ORAL | 0 refills | Status: AC | PRN

## 2018-07-25 NOTE — Telephone Encounter (Signed)
I have contacted [facility] regarding pt's referral that was sent on 07/10/18, confirmation was received. It had not been processed. After speaking with several different GI referral staff for nearly an hour (most was hold time), her referral was being put in and she should expect a call from them within next 5 or so minutes. Pt was informed. Also she stated she only got a couple of weeks supply of Zofran but I had sent in her RX for a 90 day supply. Pt will speak with pharmacy. Also informed that 11/14 f/u was okay. If any problems to let us know.

## 2018-08-08 ENCOUNTER — Inpatient Hospital Stay: Payer: Medicare Other | Attending: Hematology and Oncology

## 2018-08-08 DIAGNOSIS — D509 Iron deficiency anemia, unspecified: Secondary | ICD-10-CM | POA: Insufficient documentation

## 2018-08-08 DIAGNOSIS — D500 Iron deficiency anemia secondary to blood loss (chronic): Secondary | ICD-10-CM

## 2018-08-08 MED ORDER — CYANOCOBALAMIN 1000 MCG/ML IJ SOLN
1000.0000 ug | Freq: Once | INTRAMUSCULAR | Status: AC
  Administered 2018-08-08: 1000 ug via INTRAMUSCULAR

## 2018-08-15 ENCOUNTER — Ambulatory Visit (INDEPENDENT_AMBULATORY_CARE_PROVIDER_SITE_OTHER): Payer: Medicare Other | Admitting: Gastroenterology

## 2018-08-15 ENCOUNTER — Encounter: Payer: Self-pay | Admitting: Gastroenterology

## 2018-08-15 VITALS — BP 112/70 | HR 83 | Ht 62.0 in | Wt 130.4 lb

## 2018-08-15 DIAGNOSIS — R112 Nausea with vomiting, unspecified: Secondary | ICD-10-CM | POA: Diagnosis not present

## 2018-08-15 MED ORDER — PANTOPRAZOLE SODIUM 40 MG PO TBEC: 40.0000 mg | DELAYED_RELEASE_TABLET | Freq: Every day | ORAL | 0 refills | Status: DC

## 2018-08-16 NOTE — Progress Notes (Signed)
[name], MD [address]  [place]  [place], [place] [address]  Main: [phone]  Fax: [phone]   Primary Care Physician: [name], MD  Primary Gastroenterologist:  Dr. [name]  Chief Complaint  Patient presents with  . Follow-up    n/v    HPI: Jocelyn Case is a 72 y.o. 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  Patient has been evaluated by [facility] gastroenterology, Dr. Cephas Darby and they have ordered an upper GI series and are planning on repeat upper endoscopy depending on the results.  Their note also states that if her symptoms are thought to be due to her anastomosis they may consider surgical referral for evaluation of anastomosis revision.  Patient is taking Zofran as needed and states it helps her symptoms significantly compared to the center she was on before.  However, she still has episodes where she will have emesis without blood spontaneously without any preceding symptoms.  This occurs about once a week.  She had good appetite otherwise and eats well.  Is still eating small meals.  I anticipate the symptoms occurring as her stomach does not empty well due to her anastomosis site as described in previous notes and procedure reports.  As seen in previous EGDs despite her being n.p.o. past midnight or doing clear liquids for 24 hours prior to procedures who has been seen in the stomach and EGD has need to be repeated after further fasting, all indicating that her stomach is not emptying well due to her anastomosis.  Previous history: Last EGD October 2019, with evidence of patent Billroth II gastrojejunostomy.  Mild stenosis and ulceration seen at the [facility] digital anastomosis.  Dilation to 16.5 mm performed with through-the-scope balloon dilator.  underwent EGD in May 2019, due to  intermittent nausea and vomiting.The initial procedure showed large amount of fluid in the stomach, and the procedure had to be rescheduled for next day. Her future procedures should always does be done with 2 days of clear liquid diet , and n.p.o. past midnight.Mild stenosis was noted at the Billroth II anastomosis site, and through-the-scope dilator was used to dilate to 13.60mm.Since the dilation, she reports much improved symptoms.   Is alsoon PPI, due to acid reflux, and ulcerated mucosa seen at the anastomosis site, which she states has improved her symptoms as far as relieving her heartburn.  Findings: The examined esophagus was normal. 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 Retained fluid was found in the entire examined stomach. Removed  immediately after entering the stomach. No residual fluid or food left  after completely suctioning it. Few non-bleeding superficial gastric ulcers with no stigmata of bleeding  were found at the anastomosis. The largest lesion was 5 mm in largest  dimension. Biopsies were taken with a cold forceps for histology. 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 This was  characterized by mild stenosis and ulceration. A TTS dilator was passed  through the scope. Dilation with a 12-13.5-15 mm balloon dilator was  performed to 13.5 mm. The dilation site was examined and showed good  heme effect. A 2 to 4 mm sessile polyp with no bleeding was found in the jejunum.  Biopsies were taken with  a cold forceps for histology. Exam of the jejunum was otherwise normal. Both limbs of the Bilroth II anatomy were intubated and appeared normal  with wide openings and no narrowing. Impression: - Normal esophagus. Biopsied. GE Junction at 36  cm - Retained gastric fluid. - Non-bleeding gastric ulcers at the anastomosis, with  no stigmata of bleeding. Biopsied. - A Billroth II anastomosis was found at 48 cm,  characterized by ulceration and mild stenosis. Dilated. - Jejunal polyp(s). Biopsied. These appeared to be  inflammatory polyps - Both limbs of the Bilroth II anatomy were intubated  and appeared normal with wide openings and no narrowing. Recommendation: - Take prescribed proton pump inhibitor or H2 blocker  (antacid) medications 30 - 60 minutes before meals. - Follow an antireflux regimen. - Await pathology results.  DIAGNOSIS:  A. POLYP, SMALL BOWEL; COLD BIOPSY:  - POLYPOID GRANULATION TISSUE.  - FOCAL AREA OF GASTRIC TYPE MUCOSA SEEN.  - NEGATIVE FOR INFECTIOUS AGENTS, DYSPLASIA AND MALIGNANCY.   B. ANASTOMOTIC ULCER; COLD BIOPSY:  - FRAGMENTS OF ULCERATION WITH FRAGMENTS OF MODERATELY INFLAMED GASTRIC  AND SMALL BOWEL MUCOSA.  - NEGATIVE FOR H. PYLORI, DYSPLASIA AND MALIGNANCY.   C. ESOPHAGUS; COLD BIOPSY:  - INFLAMED ESOPHAGEAL MUCOSA WITH SPONGIOSIS AND UP TO 5 EOSINOPHILS AND  A HIGH-POWER FIELD.  - NEGATIVE FOR DYSPLASIA AND MALIGNANCY.   Note: Changes in the esophagus are most consistent with reflux  esophagitis.   Previous history: As per notes, patient underwent endoscopy in August 2018 "revealing tight opening at the prior surgical area, mucosal ulceration and friable mucosa was detected, biopsy completed."It appears that the initial EGD could not be completed due to food in the stomach, and had to be repeated after 2 days of fasting.  Last colonoscopy date: 2015  Actual colonoscopy and EGD report not available. Above  records obtained from clinic notes sent over from [place].  MRCP from February 2008: "Stable MRI of the abdomen and MRCP of the abdomen. There has been no change since prior examination from November 2007. No evidence of intra-or extrahepatic biliary duct dilation. No retained stone. Pancreatic duct appears within normal limits."  [facility]. 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  Their note also states"she does have evidence of bacterial overgrowth and culture and this probably explains a steatorrhea documented on fecal fat testing."   Current Outpatient Medications  Medication Sig Dispense Refill  . cholecalciferol (VITAMIN D) 400 units TABS tablet Take 2,000 Units by mouth daily.    . cyanocobalamin (,VITAMIN B-12,) 1000 MCG/ML injection Inject 1,000 mcg into the muscle every 30 (thirty) days.    . famotidine (PEPCID) 40 MG tablet TK 1 T PO NIGHTLY  2  . folic acid (FOLVITE) 093 MCG tablet Take 400 mcg by mouth daily.     . ondansetron (ZOFRAN) 4 MG tablet Take 1 tablet (4 mg total) by mouth 2 (two) times daily as needed for nausea or vomiting. 180 tablet 0  . oxybutynin (DITROPAN-XL) 10 MG 24 hr tablet   11  . pantoprazole (PROTONIX) 40 MG tablet Take 1 tablet (40 mg total) by mouth daily. 90 tablet 0  . colestipol (COLESTID) 1 g tablet Take 1 g by mouth.     . mirtazapine (  REMERON) 15 MG tablet Take 15 mg by mouth at bedtime.     Marland Kitchen rOPINIRole (REQUIP) 0.25 MG tablet Take 0.25 mg by mouth at bedtime.     . sucralfate (CARAFATE) 1 g tablet Take 1 tablet (1 g total) by mouth 4 (four) times daily. 120 tablet 0   No current facility-administered medications  for this visit.     Allergies as of 08/15/2018  . (No Known Allergies)    ROS:  General: Negative for anorexia, weight loss, fever, chills, fatigue, weakness. ENT: Negative for hoarseness, difficulty swallowing , nasal congestion. CV: Negative for chest pain, angina, palpitations, dyspnea on exertion, peripheral edema.  Respiratory: Negative for dyspnea at rest, dyspnea on exertion, cough, sputum, wheezing.  GI: See history of present illness. GU:  Negative for dysuria, hematuria, urinary incontinence, urinary frequency, nocturnal urination.  Endo: Negative for unusual weight change.    Physical Examination:   BP 112/70   Pulse 83   Ht 5\' 2"  (1.575 m)   Wt 130 lb 6.4 oz (59.1 kg)   BMI 23.85 kg/m   General: Well-nourished, well-developed in no acute distress.  Eyes: No icterus. Conjunctivae pink. Mouth: Oropharyngeal mucosa moist and pink , no lesions erythema or exudate. Neck: Supple, Trachea midline Abdomen: Bowel sounds are normal, nontender, nondistended, no hepatosplenomegaly or masses, no abdominal bruits or hernia , no rebound or guarding.   Extremities: No lower extremity edema. No clubbing or deformities. Neuro: Alert and oriented x 3.  Grossly intact. Skin: Warm and dry, no jaundice.   Psych: Alert and cooperative, normal mood and affect.   Labs: CMP     Component Value Date/Time   NA 137 11/05/2017 1141   K 3.8 11/05/2017 1141   CL 105 11/05/2017 1141   CO2 23 11/05/2017 1141   GLUCOSE 97 11/05/2017 1141   BUN 19 11/05/2017 1141   CREATININE 0.57 11/05/2017 1141   CALCIUM 8.3 (L) 11/05/2017 1141   PROT 5.5 (L) 11/05/2017 1141   ALBUMIN 2.7 (L) 11/05/2017 1141   AST 25 11/05/2017 1141   ALT 19 11/05/2017 1141   ALKPHOS 55 11/05/2017 1141   BILITOT 0.4 11/05/2017 1141   GFRNONAA >60 11/05/2017 1141   GFRAA >60 11/05/2017 1141   Lab Results  Component Value Date   WBC 8.5 07/10/2018   HGB 10.2 (L) 07/10/2018   HCT 34.7 (L) 07/10/2018   MCV 95.1  07/10/2018   PLT 373 07/10/2018    Imaging Studies: No results found.  Assessment and Plan:   Jocelyn Case is a 72 y.o. y/o female with history of Billroth II gastrectomy for gastric ulcer over 10 years ago, with intermittent nausea and vomiting due to mild stenosis and ulceration at the Billroth II anastomosis site with dilation x2, once in May 2019 to 13.5 mm and then again October 20 19 to 16.5 mm with through-the-scope balloon dilator with improvement in symptoms  All the patient's symptoms have improved overall, she still has intermittent nausea vomiting The symptoms are occurring due to food accumulating in the stomach over time and then her emptying the stomach through emesis Residual food seen in her stomach despite n.p.o. status is consistent with the above She was referred to [facility] gastroenterology for second opinion, and to see if they have anything else to offer to her prior to referral to surgery for anastomosis revision  Continue close follow-up with [facility], and they have schedule her for upper GI series and according to the results they may repeat her  upper endoscopy  I will decrease her Protonix to once daily at this time However, it is important to continue her on Protonix given that it is controlling her heartburn that she has from residual food sitting in her stomach.  Also anastomosis site showed ulceration in the past and therefore will continue Protonix once daily at this time (Risks of PPI use were discussed with patient including bone loss, C. Diff diarrhea, pneumonia, infections, CKD, electrolyte abnormalities. Pt. Verbalizes understanding and chooses to continue the medication.)  Continue small frequent meals.  No dysphagia.  I have encouraged patient to call me with any questions or concerns and she verbalized understanding    Dr [name]

## 2018-09-05 ENCOUNTER — Inpatient Hospital Stay: Payer: Medicare Other | Attending: Hematology and Oncology

## 2018-09-05 DIAGNOSIS — D500 Iron deficiency anemia secondary to blood loss (chronic): Secondary | ICD-10-CM

## 2018-09-05 DIAGNOSIS — E538 Deficiency of other specified B group vitamins: Secondary | ICD-10-CM | POA: Diagnosis present

## 2018-09-05 MED ORDER — CYANOCOBALAMIN 1000 MCG/ML IJ SOLN
1000.0000 ug | Freq: Once | INTRAMUSCULAR | Status: AC
  Administered 2018-09-05: 1000 ug via INTRAMUSCULAR

## 2018-09-09 ENCOUNTER — Encounter: Payer: Self-pay | Admitting: Gastroenterology

## 2018-09-12 ENCOUNTER — Ambulatory Visit: Payer: Medicare Other | Admitting: Gastroenterology

## 2018-09-14 ENCOUNTER — Emergency Department
Admission: EM | Admit: 2018-09-14 | Discharge: 2018-09-14 | Disposition: A | Discharge status: Home / Self Care | Payer: Medicare Other | Attending: Emergency Medicine | Admitting: Emergency Medicine

## 2018-09-14 ENCOUNTER — Emergency Department: Payer: Medicare Other

## 2018-09-14 ENCOUNTER — Encounter: Payer: Self-pay | Admitting: Emergency Medicine

## 2018-09-14 ENCOUNTER — Other Ambulatory Visit: Payer: Self-pay

## 2018-09-14 DIAGNOSIS — Z9049 Acquired absence of other specified parts of digestive tract: Secondary | ICD-10-CM | POA: Diagnosis not present

## 2018-09-14 DIAGNOSIS — K5900 Constipation, unspecified: Secondary | ICD-10-CM | POA: Diagnosis present

## 2018-09-14 DIAGNOSIS — Z79899 Other long term (current) drug therapy: Secondary | ICD-10-CM | POA: Diagnosis not present

## 2018-09-14 LAB — URINALYSIS, COMPLETE (UACMP) WITH MICROSCOPIC
Bacteria, UA: NONE SEEN
Bilirubin Urine: NEGATIVE
Glucose, UA: NEGATIVE mg/dL
Hgb urine dipstick: NEGATIVE
Ketones, ur: 5 mg/dL — AB
Leukocytes, UA: NEGATIVE
Nitrite: NEGATIVE
PROTEIN: NEGATIVE mg/dL
Specific Gravity, Urine: 1.013 (ref 1.005–1.030)
pH: 6 (ref 5.0–8.0)

## 2018-09-14 LAB — CBC WITH DIFFERENTIAL/PLATELET
Abs Immature Granulocytes: 0.02 10*3/uL (ref 0.00–0.07)
Basophils Absolute: 0 10*3/uL (ref 0.0–0.1)
Basophils Relative: 1 %
Eosinophils Absolute: 0.3 10*3/uL (ref 0.0–0.5)
Eosinophils Relative: 6 %
HCT: 36.1 % (ref 36.0–46.0)
Hemoglobin: 10.6 g/dL — ABNORMAL LOW (ref 12.0–15.0)
Immature Granulocytes: 0 %
Lymphocytes Relative: 16 %
Lymphs Abs: 0.9 10*3/uL (ref 0.7–4.0)
MCH: 27.2 pg (ref 26.0–34.0)
MCHC: 29.4 g/dL — ABNORMAL LOW (ref 30.0–36.0)
MCV: 92.8 fL (ref 80.0–100.0)
MONOS PCT: 8 %
Monocytes Absolute: 0.4 10*3/uL (ref 0.1–1.0)
Neutro Abs: 3.8 10*3/uL (ref 1.7–7.7)
Neutrophils Relative %: 69 %
Platelets: 322 10*3/uL (ref 150–400)
RBC: 3.89 MIL/uL (ref 3.87–5.11)
RDW: 13.8 % (ref 11.5–15.5)
WBC: 5.5 10*3/uL (ref 4.0–10.5)
nRBC: 0 % (ref 0.0–0.2)

## 2018-09-14 LAB — COMPREHENSIVE METABOLIC PANEL
ALT: 16 U/L (ref 0–44)
AST: 16 U/L (ref 15–41)
Albumin: 2.9 g/dL — ABNORMAL LOW (ref 3.5–5.0)
Alkaline Phosphatase: 56 U/L (ref 38–126)
Anion gap: 8 (ref 5–15)
BUN: 19 mg/dL (ref 8–23)
CO2: 24 mmol/L (ref 22–32)
Calcium: 8.3 mg/dL — ABNORMAL LOW (ref 8.9–10.3)
Chloride: 103 mmol/L (ref 98–111)
Creatinine, Ser: 0.65 mg/dL (ref 0.44–1.00)
GFR calc Af Amer: >60 mL/min (ref >60)
Glucose, Bld: 120 mg/dL — ABNORMAL HIGH (ref 70–99)
Potassium: 3.8 mmol/L (ref 3.5–5.1)
Sodium: 135 mmol/L (ref 135–145)
Total Bilirubin: 0.5 mg/dL (ref 0.3–1.2)
Total Protein: 5.6 g/dL — ABNORMAL LOW (ref 6.5–8.1)

## 2018-09-14 LAB — LIPASE, BLOOD: LIPASE: 33 U/L (ref 11–51)

## 2018-09-14 MED ORDER — SORBITOL 70 % SOLN
960.0000 mL | TOPICAL_OIL | Freq: Once | ORAL | Status: AC
  Administered 2018-09-14: 960 mL via RECTAL
  Filled 2018-09-14: qty 473

## 2018-09-14 NOTE — ED Provider Notes (Signed)
[facility] Emergency Department Provider Note   ____________________________________________   First MD Initiated Contact with Patient 09/14/18 [phone]     (approximate)  I have reviewed the triage vital signs and the nursing notes.   HISTORY  Chief Complaint Constipation  HPI Jocelyn Case is a 72 y.o. female who complains of constipation.  She says she has diffuse abdominal discomfort and has not had a stool in 3 days.  Has not had this problem before.  Discomfort is just kind of a dull ache.  Past Medical History:  Diagnosis Date  . Anemia   . Blood transfusion without reported diagnosis   . Complication of anesthesia   . GERD (gastroesophageal reflux disease)   . PONV (postoperative nausea and vomiting)   . Restless leg syndrome 2015   R leg   . Sleep apnea     Patient Active Problem List   Diagnosis Date Noted  . Acute peptic ulcer of stomach   . Intestinal bypass or anastomosis status   . Gastric ulcer without hemorrhage or perforation   . Jejunal polyp   . Esophageal dysphagia   . Heartburn   . Acquired absence of stomach   . Nausea and vomiting   . Reflux esophagitis   . Personal history of peptic ulcer disease   . B12 deficiency 01/04/2018  . Healthcare maintenance 07/31/2017  . Iron deficiency anemia due to chronic blood loss 07/31/2017  . Migraine headache without aura 07/31/2017  . OSA on CPAP 07/31/2017  . GERD (gastroesophageal reflux disease) 07/31/2017  . RLS (restless legs syndrome) 07/31/2017    Past Surgical History:  Procedure Laterality Date  . ABDOMINAL HYSTERECTOMY    . APPENDECTOMY    . CHOLECYSTECTOMY    . COLONOSCOPY  2 years ago  . ESOPHAGOGASTRODUODENOSCOPY (EGD) WITH PROPOFOL N/A 02/14/2018   Procedure: ESOPHAGOGASTRODUODENOSCOPY (EGD) WITH PROPOFOL;  Surgeon: Virgel Manifold, MD;  Location: [facility] ENDOSCOPY;  Service: Endoscopy;  Laterality: N/A;  . ESOPHAGOGASTRODUODENOSCOPY (EGD) WITH PROPOFOL N/A  02/15/2018   Procedure: ESOPHAGOGASTRODUODENOSCOPY (EGD) WITH PROPOFOL;  Surgeon: Virgel Manifold, MD;  Location: [facility] ENDOSCOPY;  Service: Endoscopy;  Laterality: N/A;  . ESOPHAGOGASTRODUODENOSCOPY (EGD) WITH PROPOFOL N/A 07/05/2018   Procedure: ESOPHAGOGASTRODUODENOSCOPY (EGD) WITH PROPOFOL;  Surgeon: Virgel Manifold, MD;  Location: [facility] ENDOSCOPY;  Service: Endoscopy;  Laterality: N/A;  . STOMACH SURGERY  2013  . UPPER GASTROINTESTINAL ENDOSCOPY  05/2017   pt stated "bleeding ulcer and stopped up duodenum was noted"    Prior to Admission medications   Medication Sig Start Date End Date Taking? Authorizing Provider  cholecalciferol (VITAMIN D) 400 units TABS tablet Take 2,000 Units by mouth daily.    [provider]  colestipol (COLESTID) 1 g tablet Take 1 g by mouth.  07/31/17 07/31/18  [provider]  cyanocobalamin (,VITAMIN B-12,) 1000 MCG/ML injection Inject 1,000 mcg into the muscle every 30 (thirty) days.    [provider]  famotidine (PEPCID) 40 MG tablet TK 1 T PO NIGHTLY 08/12/18   [provider]  folic acid (FOLVITE) 633 MCG tablet Take 400 mcg by mouth daily.  07/31/17   [provider]  mirtazapine (REMERON) 15 MG tablet Take 15 mg by mouth at bedtime.  07/31/17 07/31/18  [provider]  ondansetron (ZOFRAN) 4 MG tablet Take 1 tablet (4 mg total) by mouth 2 (two) times daily as needed for nausea or vomiting. 07/23/18 10/21/18  Virgel Manifold, MD  oxybutynin (DITROPAN-XL) 10 MG 24 hr  tablet  07/31/18   [provider]  pantoprazole (PROTONIX) 40 MG tablet Take 1 tablet (40 mg total) by mouth daily. 08/15/18 11/13/18  Virgel Manifold, MD  rOPINIRole (REQUIP) 0.25 MG tablet Take 0.25 mg by mouth at bedtime.  07/31/17 07/31/18  [provider]  sucralfate (CARAFATE) 1 g tablet Take 1 tablet (1 g total) by mouth 4 (four) times daily. 07/05/18 08/04/18  Virgel Manifold, MD     Allergies Morphine  Family History  Problem Relation Age of Onset  . Peptic Ulcer Disease Mother   . Dementia Mother   . COPD Father   . Colon cancer Father     Social History Social History   Tobacco Use  . Smoking status: Never Smoker  . Smokeless tobacco: Never Used  Substance Use Topics  . Alcohol use: Yes    Comment: drinks wine with dinner  . Drug use: No    Review of Systems  Constitutional: No fever/chills Eyes: No visual changes. ENT: No sore throat. Cardiovascular: Denies chest pain. Respiratory: Denies shortness of breath. Gastrointestinal: See HPI Genitourinary: Negative for dysuria. Musculoskeletal: Negative for back pain. Skin: Negative for rash. Neurological: Negative for headaches, focal weakness   ____________________________________________   PHYSICAL EXAM:  VITAL SIGNS: ED Triage Vitals  Enc Vitals Group     BP 09/14/18 0332 (!) 143/74     Pulse Rate 09/14/18 0332 (!) 108     Resp 09/14/18 0332 20     Temp 09/14/18 0332 98.1 F (36.7 C)     Temp Source 09/14/18 0332 Oral     SpO2 09/14/18 0332 99 %     Weight 09/14/18 0326 125 lb (56.7 kg)     Height 09/14/18 0326 5\' 2"  (1.575 m)     Head Circumference --      Peak Flow --      Pain Score 09/14/18 0326 7     Pain Loc --      Pain Edu? --      Excl. in [place]? --     Constitutional: Alert and oriented. Well appearing and in no acute distress. Eyes: Conjunctivae are normal. Head: Atraumatic. Nose: No congestion/rhinnorhea. Mouth/Throat: Mucous membranes are moist.  Oropharynx non-erythematous. Neck: No stridor.  Cardiovascular: Normal rate, regular rhythm. Grossly normal heart sounds.  Good peripheral circulation. Respiratory: Normal respiratory effort.  No retractions. Lungs CTAB. Gastrointestinal: Soft only diffusely tender no distention. No abdominal bruits. No CVA tenderness. Rectal: There is a large massive stool in the rectum.  This was broken up and removed manually by  myself.  Patient tolerated this very well.  There is another massive stool higher up in the colon I can just barely feel with my finger.  It seems to be starting on its way down.  We will try and see if we can get this to come out with an enema.  Musculoskeletal: No lower extremity tenderness nor edema.  No joint effusions. Neurologic:  Normal speech and language. No gross focal neurologic deficits are appreciated.  Skin:  Skin is warm, dry and intact. No rash noted. Psychiatric: Mood and affect are normal. Speech and behavior are normal.  ____________________________________________   LABS (all labs ordered are listed, but only abnormal results are displayed)  Labs Reviewed  COMPREHENSIVE METABOLIC PANEL - Abnormal; Notable for the following components:      Result Value   Glucose, Bld 120 (*)    Calcium 8.3 (*)    Total Protein 5.6 (*)  Albumin 2.9 (*)    All other components within normal limits  CBC WITH DIFFERENTIAL/PLATELET - Abnormal; Notable for the following components:   Hemoglobin 10.6 (*)    MCHC 29.4 (*)    All other components within normal limits  URINALYSIS, COMPLETE (UACMP) WITH MICROSCOPIC - Abnormal; Notable for the following components:   Color, Urine STRAW (*)    APPearance CLEAR (*)    Ketones, ur 5 (*)    All other components within normal limits  LIPASE, BLOOD   ____________________________________________  EKG   ____________________________________________  RADIOLOGY  ED MD interpretation: Chest and abdomen x-rays read by me radiologist had seen in the chest x-ray looks good there is a lot of stool in the colon.  Official radiology report(s): Dg Abdomen Acute W/chest  Result Date: 09/14/2018 CLINICAL DATA:  Diffuse abdominal pain. Constipation. EXAM: DG ABDOMEN ACUTE W/ 1V CHEST COMPARISON:  None. FINDINGS: The cardiomediastinal contours are normal. The lungs are clear. There is no free intra-abdominal air. No dilated bowel loops to suggest  obstruction. Large volume of stool throughout the colon. Stool distends the rectum, rectal distention of 5.5 cm. No radiopaque calculi. Surgical clips in the right upper quadrant from cholecystectomy. Scoliotic curvature of the lumbar spine. No acute osseous abnormalities are seen. Surgical hardware in the lower cervical spine is partially included. IMPRESSION: 1. Large volume of stool throughout the colon suggesting constipation. Stool distends the rectum, rectal distention of 5.5 cm. No bowel obstruction or free air. 2. No acute pulmonary process. Electronically Signed   By: Keith Rake M.D.   On: 09/14/2018 05:07    ____________________________________________   PROCEDURES  Procedure(s) performed:   Procedures  Critica  ____________________________________________   INITIAL IMPRESSION / ASSESSMENT AND PLAN / ED COURSE   Patient has good results with the enema.  She feels better we will let her go home.       ____________________________________________   FINAL CLINICAL IMPRESSION(S) / ED DIAGNOSES  Final diagnoses:  Constipation, unspecified constipation type     ED Discharge Orders    None       Note:  This document was prepared using Dragon voice recognition software and may include unintentional dictation errors.    Nena Polio, MD 09/14/18 [phone]

## 2018-09-14 NOTE — Discharge Instructions (Addendum)
Please continue to drink plenty of fluids and eat fiber.  This includes vegetables, bran cereal, even pop corn counts as fiber.  Please follow-up with your regular doctor return for any further problems.

## 2018-09-14 NOTE — ED Triage Notes (Signed)
Pt arrives POV to triage with c/o constipation x 3 days. Pt is in NAD.

## 2018-09-14 NOTE — ED Notes (Signed)
Patient disimpacted by Dr. Cinda Quest

## 2018-09-14 NOTE — ED Notes (Signed)
ED Provider at bedside. 

## 2018-09-14 NOTE — ED Notes (Signed)
Patient up to the bathroom and had a small BM.

## 2018-10-03 ENCOUNTER — Inpatient Hospital Stay: Payer: Medicare Other | Attending: Hematology and Oncology

## 2018-10-03 DIAGNOSIS — D509 Iron deficiency anemia, unspecified: Secondary | ICD-10-CM | POA: Insufficient documentation

## 2018-10-03 DIAGNOSIS — E538 Deficiency of other specified B group vitamins: Secondary | ICD-10-CM | POA: Insufficient documentation

## 2018-10-03 DIAGNOSIS — Z79899 Other long term (current) drug therapy: Secondary | ICD-10-CM | POA: Insufficient documentation

## 2018-10-03 DIAGNOSIS — D500 Iron deficiency anemia secondary to blood loss (chronic): Secondary | ICD-10-CM

## 2018-10-03 MED ORDER — CYANOCOBALAMIN 1000 MCG/ML IJ SOLN
1000.0000 ug | Freq: Once | INTRAMUSCULAR | Status: AC
  Administered 2018-10-03: 1000 ug via INTRAMUSCULAR

## 2018-10-07 ENCOUNTER — Encounter: Payer: Self-pay | Admitting: Gastroenterology

## 2018-10-07 ENCOUNTER — Telehealth: Payer: Self-pay | Admitting: Gastroenterology

## 2018-10-07 ENCOUNTER — Ambulatory Visit (INDEPENDENT_AMBULATORY_CARE_PROVIDER_SITE_OTHER): Payer: Medicare Other | Admitting: Gastroenterology

## 2018-10-07 VITALS — BP 138/79 | HR 103 | Ht 62.0 in | Wt 123.0 lb

## 2018-10-07 DIAGNOSIS — R112 Nausea with vomiting, unspecified: Secondary | ICD-10-CM | POA: Diagnosis not present

## 2018-10-07 NOTE — Progress Notes (Signed)
Jocelyn Antigua, MD [address]  [place]  [place], [place] [address]  Main: [phone]  Fax: [phone]   Primary Care Physician: [name], MD  Primary Gastroenterologist:  Dr. [name]  Chief Complaint  Patient presents with  . Nausea    also c/o throat closing up  . Emesis  . Weight Loss    HPI: Jocelyn Case is a 73 y.o. 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  Patient reports worsening nausea and vomiting.  Only takes half a pill of Zofran once every morning.  Also on Protonix once daily.  No abdominal pain.  Reports having constipation that started after the Zofran, and is therefore taking MiraLAX daily which is helping with her constipation.  Patient has been found to have food and liquid in her stomach after over 12-hour fast for her EGDs, requiring a longer fast during each procedure, and this has been attributed to slower emptying due to her anastomosis site.  Therefore, her above symptoms are due to the same problem.  She has been evaluated by [facility] gastroenterology for the same recently, and underwent an upper endoscopy.  I do not have the upper endoscopy results but patient states that Dr. Darla Lesches had discussed a stent placement at the anastomosis site and she is seeing him at the end of the month for the same in clinic.Their note also states that if her symptoms are thought to be due to her anastomosis they may consider surgical referral for evaluation of anastomosis revision.   Previous history: Last EGD October 2019, with evidence of patent Billroth II gastrojejunostomy.  Mild stenosis and ulceration seen at the [facility] digital anastomosis.  Dilation to 16.5 mm performed with through-the-scope balloon dilator.  underwent EGD in May 2019, due to intermittent nausea and vomiting.The initial procedure showed large amount of fluid in the stomach, and the  procedure had to be rescheduled for next day. Her future procedures should always does be done with 2 days of clear liquid diet , and n.p.o. past midnight.Mild stenosis was noted at the Billroth II anastomosis site, and through-the-scope dilator was used to dilate to 13.74mm.Since the dilation, she reports much improved symptoms.   Is alsoon PPI, due to acid reflux, and ulcerated mucosa seen at the anastomosis site, which she states has improved her symptoms as far as relieving her heartburn.  Findings: The examined esophagus was normal. 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 Retained fluid was found in the entire examined stomach. Removed  immediately after entering the stomach. No residual fluid or food left  after completely suctioning it. Few non-bleeding superficial gastric ulcers with no stigmata of bleeding  were found at the anastomosis. The largest lesion was 5 mm in largest  dimension. Biopsies were taken with a cold forceps for histology. 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 This was  characterized by mild stenosis and ulceration. A TTS dilator was passed  through the scope. Dilation with a 12-13.5-15 mm balloon dilator was  performed to 13.5 mm. The dilation site was examined and showed good  heme effect. A 2 to 4 mm sessile polyp with no bleeding was found in the jejunum.  Biopsies were taken with a cold forceps for histology. Exam of the jejunum was otherwise normal. Both limbs of the Bilroth  II anatomy were intubated and appeared normal  with wide openings and no narrowing. Impression: - Normal esophagus. Biopsied. GE Junction at 36 cm - Retained gastric fluid. - Non-bleeding gastric ulcers at the  anastomosis, with  no stigmata of bleeding. Biopsied. - A Billroth II anastomosis was found at 48 cm,  characterized by ulceration and mild stenosis. Dilated. - Jejunal polyp(s). Biopsied. These appeared to be  inflammatory polyps - Both limbs of the Bilroth II anatomy were intubated  and appeared normal with wide openings and no narrowing. Recommendation: - Take prescribed proton pump inhibitor or H2 blocker  (antacid) medications 30 - 60 minutes before meals. - Follow an antireflux regimen. - Await pathology results.  DIAGNOSIS:  A. POLYP, SMALL BOWEL; COLD BIOPSY:  - POLYPOID GRANULATION TISSUE.  - FOCAL AREA OF GASTRIC TYPE MUCOSA SEEN.  - NEGATIVE FOR INFECTIOUS AGENTS, DYSPLASIA AND MALIGNANCY.   B. ANASTOMOTIC ULCER; COLD BIOPSY:  - FRAGMENTS OF ULCERATION WITH FRAGMENTS OF MODERATELY INFLAMED GASTRIC  AND SMALL BOWEL MUCOSA.  - NEGATIVE FOR H. PYLORI, DYSPLASIA AND MALIGNANCY.   C. ESOPHAGUS; COLD BIOPSY:  - INFLAMED ESOPHAGEAL MUCOSA WITH SPONGIOSIS AND UP TO 5 EOSINOPHILS AND  A HIGH-POWER FIELD.  - NEGATIVE FOR DYSPLASIA AND MALIGNANCY.   Note: Changes in the esophagus are most consistent with reflux  esophagitis.   Previous history: As per notes, patient underwent endoscopy in August 2018 "revealing tight opening at the prior surgical area, mucosal ulceration and friable mucosa was detected, biopsy completed."It appears that the initial EGD could not be completed due to food in the stomach, and had to be repeated after 2 days of fasting.  Last colonoscopy date: 2015  Actual colonoscopy and EGD report not available. Above records obtained from clinic notes sent over from [place].  MRCP from February 2008: "Stable MRI of the abdomen  and MRCP of the abdomen. There has been no change since prior examination from November 2007. No evidence of intra-or extrahepatic biliary duct dilation. No retained stone. Pancreatic duct appears within normal limits."  [facility]. 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  Their note also states"she does have evidence of bacterial overgrowth and culture and this probably explains a steatorrhea documented on fecal fat testing."  Current Outpatient Medications  Medication Sig Dispense Refill  . cholecalciferol (VITAMIN D) 400 units TABS tablet Take 2,000 Units by mouth daily.    . cyanocobalamin (,VITAMIN B-12,) 1000 MCG/ML injection Inject 1,000 mcg into the muscle every 30 (thirty) days.    . famotidine (PEPCID) 40 MG tablet TK 1 T PO NIGHTLY  2  . folic acid (FOLVITE) 062 MCG tablet Take 400 mcg by mouth daily.     . ondansetron (ZOFRAN) 4 MG tablet Take 1 tablet (4 mg total) by mouth 2 (two) times daily as needed for nausea or vomiting. 180 tablet 0  . oxybutynin (DITROPAN-XL) 10 MG 24 hr tablet   11  . pantoprazole (PROTONIX) 40 MG tablet Take 1 tablet (40 mg total) by mouth daily. 90 tablet 0  . colestipol (COLESTID) 1 g tablet Take 1 g by mouth.     . mirtazapine (REMERON) 15 MG tablet Take 15 mg by mouth at bedtime.     Marland Kitchen rOPINIRole (REQUIP)  0.25 MG tablet Take 0.25 mg by mouth at bedtime.     . sucralfate (CARAFATE) 1 g tablet Take 1 tablet (1 g total) by mouth 4 (four) times daily. 120 tablet 0   No current facility-administered medications for this visit.     Allergies as of 10/07/2018 - Review Complete 10/07/2018  Allergen Reaction Noted  . Morphine  Nausea And Vomiting 08/12/2018    ROS:  General: Negative for anorexia, weight loss, fever, chills, fatigue, weakness. ENT: Negative for hoarseness, difficulty swallowing , nasal congestion. CV: Negative for chest pain, angina, palpitations, dyspnea on exertion, peripheral edema.  Respiratory: Negative for dyspnea at rest, dyspnea on exertion, cough, sputum, wheezing.  GI: See history of present illness. GU:  Negative for dysuria, hematuria, urinary incontinence, urinary frequency, nocturnal urination.  Endo: Negative for unusual weight change.    Physical Examination:   BP 138/79   Pulse (!) 103   Ht 5\' 2"  (1.575 m)   Wt 123 lb (55.8 kg)   BMI 22.50 kg/m   General: Well-nourished, well-developed in no acute distress.  Eyes: No icterus. Conjunctivae pink. Mouth: Oropharyngeal mucosa moist and pink , no lesions erythema or exudate. Neck: Supple, Trachea midline Abdomen: Bowel sounds are normal, nontender, nondistended, no hepatosplenomegaly or masses, no abdominal bruits or hernia , no rebound or guarding.   Extremities: No lower extremity edema. No clubbing or deformities. Neuro: Alert and oriented x 3.  Grossly intact. Skin: Warm and dry, no jaundice.   Psych: Alert and cooperative, normal mood and affect.   Labs: CMP     Component Value Date/Time   NA 135 09/14/2018 0430   K 3.8 09/14/2018 0430   CL 103 09/14/2018 0430   CO2 24 09/14/2018 0430   GLUCOSE 120 (H) 09/14/2018 0430   BUN 19 09/14/2018 0430   CREATININE 0.65 09/14/2018 0430   CALCIUM 8.3 (L) 09/14/2018 0430   PROT 5.6 (L) 09/14/2018 0430   ALBUMIN 2.9 (L) 09/14/2018 0430   AST 16 09/14/2018 0430   ALT 16 09/14/2018 0430   ALKPHOS 56 09/14/2018 0430   BILITOT 0.5 09/14/2018 0430   GFRNONAA >60 09/14/2018 0430   GFRAA >60 09/14/2018 0430   Lab Results  Component Value Date   WBC 5.5 09/14/2018   HGB 10.6 (L) 09/14/2018   HCT 36.1 09/14/2018   MCV 92.8 09/14/2018   PLT 322 09/14/2018    Imaging  Studies: Dg Abdomen Acute W/chest  Result Date: 09/14/2018 CLINICAL DATA:  Diffuse abdominal pain. Constipation. EXAM: DG ABDOMEN ACUTE W/ 1V CHEST COMPARISON:  None. FINDINGS: The cardiomediastinal contours are normal. The lungs are clear. There is no free intra-abdominal air. No dilated bowel loops to suggest obstruction. Large volume of stool throughout the colon. Stool distends the rectum, rectal distention of 5.5 cm. No radiopaque calculi. Surgical clips in the right upper quadrant from cholecystectomy. Scoliotic curvature of the lumbar spine. No acute osseous abnormalities are seen. Surgical hardware in the lower cervical spine is partially included. IMPRESSION: 1. Large volume of stool throughout the colon suggesting constipation. Stool distends the rectum, rectal distention of 5.5 cm. No bowel obstruction or free air. 2. No acute pulmonary process. Electronically Signed   By: Keith Rake M.D.   On: 09/14/2018 05:07    Assessment and Plan:   Brilee Port is a 73 y.o. y/o female with history of Billroth II gastrectomy for gastric ulcer over 10 years ago, with intermittent nausea and vomiting due to mild stenosis and ulceration of Billroth  II anastomosis site, despite dilation, evaluated at [facility] and being considered for stent placement anastomosis site versus referral for surgical revision  Patient continues with intermittent nausea and vomiting I will try to reach Dr. Cephas Darby to discuss this and see if we can expedite her follow-up appointment with them about discussing stent placement versus surgical revision  In the meantime, she is only using half a Zofran once daily for nausea I have asked her to start using Zofran every 6 hours as needed  In addition, she is taking MiraLAX for constipation Since MiraLAX is requiring her to drink a lot of liquid with the medication, this liquid likely sits in her stomach for a long time and causes her to have nausea and vomiting.   Therefore, I have  asked her to discontinue the MiraLAX and start taking Dulcolax daily and decrease this dose to only 2 to 3 times a  week if she has loose stools.  Continue small frequent meals  Dr Jocelyn Case

## 2018-10-07 NOTE — Telephone Encounter (Signed)
Pt left vm she would like a call she has been vomiting and she needs to talk to someone and get something done

## 2018-10-07 NOTE — Telephone Encounter (Signed)
Pt was seen in office this am. She walked in.

## 2018-10-08 ENCOUNTER — Telehealth: Payer: Self-pay | Admitting: Gastroenterology

## 2018-10-08 NOTE — Telephone Encounter (Signed)
Pt left vm she was expecting a call from Dr. Bonna Gains today to see if she got in touch with her Doctor in [facility], [place]

## 2018-10-08 NOTE — Telephone Encounter (Signed)
Spoke with pt  I informed her about Dr. Darene Lamer message and she will await Dr. Darene Lamer return call

## 2018-10-10 ENCOUNTER — Telehealth: Payer: Self-pay | Admitting: Gastroenterology

## 2018-10-10 NOTE — Telephone Encounter (Signed)
I spoke to Dr. Robin Searing on the phone today and explained patient's ongoing nausea and vomiting.  He stated that if she had continued symptoms they were going to consider stenting the area of her anastomosis/stricture.  He states he will give her a call to set this up.  I called Casimer Lanius back and explained this to her and she is thankful and will await their call.

## 2018-10-16 ENCOUNTER — Encounter: Payer: Self-pay | Admitting: Gastroenterology

## 2018-10-17 ENCOUNTER — Inpatient Hospital Stay: Payer: Medicare Other

## 2018-10-17 DIAGNOSIS — D500 Iron deficiency anemia secondary to blood loss (chronic): Secondary | ICD-10-CM

## 2018-10-17 DIAGNOSIS — E538 Deficiency of other specified B group vitamins: Secondary | ICD-10-CM | POA: Diagnosis not present

## 2018-10-17 LAB — CBC WITH DIFFERENTIAL/PLATELET
Abs Immature Granulocytes: 0.01 10*3/uL (ref 0.00–0.07)
Basophils Absolute: 0 10*3/uL (ref 0.0–0.1)
Basophils Relative: 0 %
Eosinophils Absolute: 0.5 10*3/uL (ref 0.0–0.5)
Eosinophils Relative: 10 %
HCT: 31.7 % — ABNORMAL LOW (ref 36.0–46.0)
Hemoglobin: 9.4 g/dL — ABNORMAL LOW (ref 12.0–15.0)
Immature Granulocytes: 0 %
Lymphocytes Relative: 25 %
Lymphs Abs: 1.2 10*3/uL (ref 0.7–4.0)
MCH: 27.3 pg (ref 26.0–34.0)
MCHC: 29.7 g/dL — ABNORMAL LOW (ref 30.0–36.0)
MCV: 92.2 fL (ref 80.0–100.0)
Monocytes Absolute: 0.5 10*3/uL (ref 0.1–1.0)
Monocytes Relative: 11 %
Neutro Abs: 2.6 10*3/uL (ref 1.7–7.7)
Neutrophils Relative %: 54 %
Platelets: 284 10*3/uL (ref 150–400)
RBC: 3.44 MIL/uL — ABNORMAL LOW (ref 3.87–5.11)
RDW: 13.5 % (ref 11.5–15.5)
WBC: 4.7 10*3/uL (ref 4.0–10.5)
nRBC: 0 % (ref 0.0–0.2)

## 2018-10-17 LAB — FERRITIN: Ferritin: 54 ng/mL (ref 11–307)

## 2018-10-18 ENCOUNTER — Other Ambulatory Visit: Payer: Self-pay | Admitting: Hematology and Oncology

## 2018-10-18 ENCOUNTER — Encounter: Payer: Self-pay | Admitting: Hematology and Oncology

## 2018-10-18 ENCOUNTER — Inpatient Hospital Stay: Payer: Medicare Other

## 2018-10-18 ENCOUNTER — Inpatient Hospital Stay (HOSPITAL_BASED_OUTPATIENT_CLINIC_OR_DEPARTMENT_OTHER): Payer: Medicare Other | Admitting: Hematology and Oncology

## 2018-10-18 VITALS — BP 110/71 | HR 79 | Temp 97.9°F | Resp 20 | Wt 122.7 lb

## 2018-10-18 DIAGNOSIS — D509 Iron deficiency anemia, unspecified: Secondary | ICD-10-CM | POA: Diagnosis not present

## 2018-10-18 DIAGNOSIS — D500 Iron deficiency anemia secondary to blood loss (chronic): Secondary | ICD-10-CM

## 2018-10-18 DIAGNOSIS — Z9689 Presence of other specified functional implants: Secondary | ICD-10-CM

## 2018-10-18 DIAGNOSIS — R634 Abnormal weight loss: Secondary | ICD-10-CM | POA: Diagnosis not present

## 2018-10-18 DIAGNOSIS — Z931 Gastrostomy status: Secondary | ICD-10-CM | POA: Diagnosis not present

## 2018-10-18 DIAGNOSIS — E538 Deficiency of other specified B group vitamins: Secondary | ICD-10-CM | POA: Diagnosis not present

## 2018-10-18 NOTE — Progress Notes (Signed)
[facility] day:  10/18/2018   Chief Complaint: Jocelyn Case is a 73 y.o. female s/p partial gastrectomy and iron deficiency who is seen for 3 month assessment.  HPI: The patient was last seen in the hematology clinic on 07/11/2018.  At that time, her energy remained low.  She had been "super lazy" as of late.  She had episodes of nausea controlled with ondansetron.  Exam was grossly unremarkable.  Hemoglobin was 10.2, hematocrit 34.7, MCV 95.1, and platelets 273,000. Ferritin was normal at 132.  She was seen in the [facility] ER on 09/14/2018 with constipation.  Imaging revealed a large volume of stool throughout the colon suggesting constipation. Stool distends the rectum, rectal distention of 5.5 cm. There was no bowel obstruction or free air. Exam a large massive stool in the rectum.  She underwent manual disimpaction.  Labs on 09/14/2018 revealed a hematocrit of 36.1, hemoglobin 10.6, MCV 92.8.  Creatinine was 0.65.  Albumin was 2.9.  Protein was 5.6.  LFTs were normal.  She saw Dr. Bonna Gains on 10/07/2018 for nausea, vomiting, and weight loss.  Notes reviewed.  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   Evaluation at [facility] was planned with consideration of stent placement at the anastomosis site versus referral for surgical revision.  Dr. Robin Searing, [place] gastroenterologist, has been contacted.  She received B12 monthly (last 10/03/2018).  Patient was seen by on 10/15/2018 by Dr. Cephas Darby, at which time she has had a duodenal stent placed. Patient had stringent dietary restrictions ordered. She has been asked to maintain a low fiber and low fat diet.   Labs on 10/17/2018 revealed a hematocrit of 31.7, hemoglobin 9.4, MCV 92.2, platelets 284,000, WBC 4700 with an ANC of 2600.   Ferritin was 54.  Symptomatically, she feels sleepy. She notes that she "gets like this" every day around this time of day. Patient has not had a bowel movement since her procedure on 10/15/2018. She has been taking MiraLAX and bisacodyl, with no resulting bowel movement. Patient denies bleeding; no hematochezia, melena, or gross hematuria. Patient denies that she has experienced any B symptoms. She denies any interval infections.   Patient advises that she maintains an adequate appetite. She is eating well. Weight today is 122 lb 11.2 oz (55.7 kg), which compared to her last visit to the clinic, represents a 10 pound decrease.    Patient denies pain in the clinic today.   Past Medical History:  Diagnosis Date  . Anemia   . Blood transfusion without reported diagnosis   . Complication of anesthesia   . GERD (gastroesophageal reflux disease)   . PONV (postoperative nausea and vomiting)   . Restless leg syndrome 2015   R leg   . Sleep apnea     Past Surgical History:  Procedure Laterality Date  . ABDOMINAL HYSTERECTOMY    . APPENDECTOMY    . CHOLECYSTECTOMY    . COLONOSCOPY  2 years ago  . ESOPHAGOGASTRODUODENOSCOPY (EGD) WITH PROPOFOL N/A 02/14/2018   Procedure: ESOPHAGOGASTRODUODENOSCOPY (EGD) WITH PROPOFOL;  Surgeon: Virgel Manifold, MD;  Location: [facility] ENDOSCOPY;  Service: Endoscopy;  Laterality: N/A;  . ESOPHAGOGASTRODUODENOSCOPY (EGD) WITH PROPOFOL N/A 02/15/2018   Procedure: ESOPHAGOGASTRODUODENOSCOPY (EGD) WITH PROPOFOL;  Surgeon: Virgel Manifold, MD;  Location: [facility] ENDOSCOPY;  Service: Endoscopy;  Laterality: N/A;  . ESOPHAGOGASTRODUODENOSCOPY (EGD) WITH PROPOFOL N/A 07/05/2018   Procedure: ESOPHAGOGASTRODUODENOSCOPY (EGD) WITH PROPOFOL;  Surgeon: Virgel Manifold, MD;  Location: [facility] ENDOSCOPY;  Service: Endoscopy;  Laterality: N/A;  . STOMACH SURGERY  2013  . UPPER GASTROINTESTINAL ENDOSCOPY  05/2017   pt stated "bleeding ulcer and stopped up duodenum was  noted"    Family History  Problem Relation Age of Onset  . Peptic Ulcer Disease Mother   . Dementia Mother   . COPD Father   . Colon cancer Father     Social History:  reports that she has never smoked. She has never used smokeless tobacco. She reports current alcohol use. She reports that she does not use drugs.  She drinks wine in the evening.  She previously lived in [place].  She moved to [place] in 05/2017.  Her daughter lives in [place].  She is temporarily living in an apartment in [place].  The patient is alone today.  Allergies:  Allergies  Allergen Reactions  . Morphine Nausea And Vomiting    Current Medications: Current Outpatient Medications  Medication Sig Dispense Refill  . Cholecalciferol (VITAMIN D3) 50 MCG (2000 UT) TABS Take 2,000 Units by mouth daily.    . colestipol (COLESTID) 1 g tablet Take 1 g by mouth.     . cyanocobalamin (,VITAMIN B-12,) 1000 MCG/ML injection Inject 1,000 mcg into the muscle every 30 (thirty) days.    . famotidine (PEPCID) 40 MG tablet TK 1 T PO NIGHTLY  2  . folic acid (FOLVITE) 696 MCG tablet Take 400 mcg by mouth daily.     . mirtazapine (REMERON) 15 MG tablet Take 15 mg by mouth at bedtime.     . ondansetron (ZOFRAN) 4 MG tablet Take 1 tablet (4 mg total) by mouth 2 (two) times daily as needed for nausea or vomiting. 180 tablet 0  . oxybutynin (DITROPAN-XL) 10 MG 24 hr tablet Take 10 mg by mouth at bedtime.   11  . pantoprazole (PROTONIX) 40 MG tablet Take 1 tablet (40 mg total) by mouth daily. 90 tablet 0  . rOPINIRole (REQUIP) 0.25 MG tablet Take 0.25 mg by mouth at bedtime.     . sucralfate (CARAFATE) 1 g tablet Take 1 tablet (1 g total) by mouth 4 (four) times daily. 120 tablet 0   No current facility-administered medications for this visit.     Review of Systems  Constitutional: Negative for chills, diaphoresis, fever, malaise/fatigue and weight loss (10 pounds).       Feels "ok".  HENT: Positive for hearing  loss (hearing aides). Negative for congestion, ear discharge, ear pain, nosebleeds, sinus pain and sore throat.   Eyes: Negative.  Negative for blurred vision, double vision, photophobia and pain.  Respiratory: Negative.  Negative for cough, hemoptysis, sputum production and shortness of breath.   Cardiovascular: Negative.  Negative for chest pain, palpitations, orthopnea, leg swelling and PND.  Gastrointestinal: Positive for constipation. Negative for abdominal pain, blood in stool, diarrhea, heartburn, melena, nausea (controlled with ondansetron) and vomiting.       S/p duodenal stent placement.  Genitourinary: Negative.  Negative for dysuria, frequency, hematuria and urgency.  Musculoskeletal: Negative.  Negative for back pain, falls, joint pain, myalgias and neck pain.  Skin: Negative.  Negative for itching and rash.  Neurological: Negative for dizziness, tremors, sensory change, speech change, focal weakness, weakness and headaches.       Chronic restless legs.  Endo/Heme/Allergies: Negative.  Does not bruise/bleed easily.  Psychiatric/Behavioral: Negative.  Negative for depression and memory loss. The patient is not nervous/anxious and does not have insomnia.   All other systems reviewed and are negative.  Performance status (ECOG): 1 - Symptomatic but completely ambulatory  Vital Signs BP 110/71 (BP Location: Right Arm, Patient Position: Sitting)   Pulse 79   Temp 97.9 F (36.6 C) (Oral)   Resp 20   Wt 122 lb 11.2 oz (55.7 kg)   SpO2 93%   BMI 22.44 kg/m   Physical Exam  Constitutional: She is oriented to person, place, and time and well-developed, well-nourished, and in no distress. No distress.  HENT:  Head: Normocephalic and atraumatic.  Mouth/Throat: Oropharynx is clear and moist. No oropharyngeal exudate.  Short graying blonde hair. Hearing aides.  Eyes: Pupils are equal, round, and reactive to light. Conjunctivae and EOM are normal. No scleral icterus.  Glasses.  Blue  eyes.  Neck: Normal range of motion. Neck supple. No JVD present.  Cardiovascular: Normal rate, regular rhythm and normal heart sounds. Exam reveals no gallop and no friction rub.  No murmur heard. Pulmonary/Chest: Effort normal and breath sounds normal. No respiratory distress. She has no wheezes. She has no rales.  Abdominal: Soft. Bowel sounds are normal. She exhibits no distension and no mass. There is no abdominal tenderness. There is no rebound and no guarding.  Musculoskeletal: Normal range of motion.        General: No tenderness or edema.  Lymphadenopathy:    She has no cervical adenopathy.  Neurological: She is alert and oriented to person, place, and time. Gait normal.  Skin: Skin is warm and dry. No rash noted. She is not diaphoretic. No erythema. No pallor.  Psychiatric: Mood, affect and judgment normal.  Nursing note and vitals reviewed.   Appointment on 10/17/2018  Component Date Value Ref Range Status  . Ferritin 10/17/2018 54  11 - 307 ng/mL Final   Performed at [facility], [address] [place], [place] [address]  . WBC 10/17/2018 4.7  4.0 - 10.5 K/uL Final  . RBC 10/17/2018 3.44* 3.87 - 5.11 MIL/uL Final  . Hemoglobin 10/17/2018 9.4* 12.0 - 15.0 g/dL Final  . HCT 10/17/2018 31.7* 36.0 - 46.0 % Final  . MCV 10/17/2018 92.2  80.0 - 100.0 fL Final  . MCH 10/17/2018 27.3  26.0 - 34.0 pg Final  . MCHC 10/17/2018 29.7* 30.0 - 36.0 g/dL Final  . RDW 10/17/2018 13.5  11.5 - 15.5 % Final  . Platelets 10/17/2018 284  150 - 400 K/uL Final  . nRBC 10/17/2018 0.0  0.0 - 0.2 % Final  . Neutrophils Relative % 10/17/2018 54  % Final  . Neutro Abs 10/17/2018 2.6  1.7 - 7.7 K/uL Final  . Lymphocytes Relative 10/17/2018 25  % Final  . Lymphs Abs 10/17/2018 1.2  0.7 - 4.0 K/uL Final  . Monocytes Relative 10/17/2018 11  % Final  . Monocytes Absolute 10/17/2018 0.5  0.1 - 1.0 K/uL Final  . Eosinophils Relative 10/17/2018 10  % Final  . Eosinophils Absolute 10/17/2018  0.5  0.0 - 0.5 K/uL Final  . Basophils Relative 10/17/2018 0  % Final  . Basophils Absolute 10/17/2018 0.0  0.0 - 0.1 K/uL Final  . Immature Granulocytes 10/17/2018 0  % Final  . Abs Immature Granulocytes 10/17/2018 0.01  0.00 - 0.07 K/uL Final   Performed at [facility], [address] [place], [place] [address]    Assessment:  Jocelyn Case is a  73 y.o. female s/p partial gastrectomy in 2009 and subsequent iron deficiency anemia.  She previously received IV iron off/on every 6 months for the past 3 years while living in [place].  She is intolerant of oral iron.  Work-up on 11/23/2017 revealed a hematocrit of 31, hemoglobin 9.6, MCV 84.7, platelets 379,000, WBC 5600 with an ANC of 3200.  Ferritin was 7.  B12 was 267.  Folate was 28.  She received Venofer weekly x 4 (11/30/2017 - 12/21/2017) and x 2 (04/16/2018 - 04/23/2018).  Ferritin has been followed: 5 on 11/05/2017, 7 on 11/23/2017, 71 on 01/04/2018, 45 on 02/15/2018, 12 on 04/08/2018, 130 on 05/21/2018, 132 on 07/10/2018, and 54 on 10/17/2018.  She receives B12 IM monthly (last 10/03/2018).  She is on folic acid.  Folate was 28 on 11/23/2017.  EGD on  02/15/2018 revealed non-bleeding gastric ulcers at the anastomosis, with no stigmata of bleeding. Biopsied.  A Billroth II anastomosis characterized by ulceration and mild stenosis was dilated.  Jejunal polyp(s) were biopsied. Pathology revealed no evidence of malignancy.  EGD on 07/05/2018 revealed non-bleeding gastric ulcer with no stigmata of bleeding.  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  Duodenal stent was placed on 10/15/2018.  Symptomatically, she feels sleepy today.  She has lost weight.  Exam is stable.  Hemoglobin is 9.4.  Ferritin is 54.   Plan: 1. Review labs from yesterday. 2. Iron deficency anemia Hemoglobin is 9.4.  MCV is 92.2. Ferritin is 54. Ferritin goal > 30. No IV iron today. 3. B12 deficiency Patient  continues monthly B12 injections. Last B12 on 10/03/2018. Check folate level annually. 4. Weight loss  Discuss interval weight loss and caloric intake.  Patient is s/p duodenal stent placement.  Diet is low fiber and low-fat. 5. RTC on 10/31/2018 [facility]) for labs) CBC with differential, reticulocyte, iron studies, sed rate, folate, TSH, [place]) and B12. 6. RTC in 3 months ([place]) for MD assessment, labs (CBC with diff, ferritin- day before) and +/- Venofer.   Honor Loh, NP  10/18/2018, 1:38 PM   I saw and evaluated the patient, participating in the key portions of the service and reviewing pertinent diagnostic studies and records.  I reviewed the nurse practitioner's note and agree with the findings and the plan.  The assessment and plan were discussed with the patient. A few questions were asked by the patient and answered.   Nolon Stalls, MD 10/18/2018,1:38 PM

## 2018-10-18 NOTE — Progress Notes (Signed)
Pt here for follow up. States she had a stent placed in Duodenum on Tuesday. Reports nausea has stopped since procedure. Pt reports she is fatigued more than usual. Denies any pain at this time.

## 2018-10-31 ENCOUNTER — Inpatient Hospital Stay: Payer: Medicare Other

## 2018-10-31 DIAGNOSIS — D500 Iron deficiency anemia secondary to blood loss (chronic): Secondary | ICD-10-CM

## 2018-10-31 DIAGNOSIS — E538 Deficiency of other specified B group vitamins: Secondary | ICD-10-CM | POA: Diagnosis not present

## 2018-10-31 LAB — CBC WITH DIFFERENTIAL/PLATELET
Abs Immature Granulocytes: 0.02 10*3/uL (ref 0.00–0.07)
Basophils Absolute: 0 10*3/uL (ref 0.0–0.1)
Basophils Relative: 1 %
Eosinophils Absolute: 0.3 10*3/uL (ref 0.0–0.5)
Eosinophils Relative: 6 %
HCT: 34.3 % — ABNORMAL LOW (ref 36.0–46.0)
Hemoglobin: 10 g/dL — ABNORMAL LOW (ref 12.0–15.0)
Immature Granulocytes: 0 %
Lymphocytes Relative: 19 %
Lymphs Abs: 1.1 10*3/uL (ref 0.7–4.0)
MCH: 26.8 pg (ref 26.0–34.0)
MCHC: 29.2 g/dL — ABNORMAL LOW (ref 30.0–36.0)
MCV: 92 fL (ref 80.0–100.0)
Monocytes Absolute: 0.4 10*3/uL (ref 0.1–1.0)
Monocytes Relative: 7 %
Neutro Abs: 3.9 10*3/uL (ref 1.7–7.7)
Neutrophils Relative %: 67 %
Platelets: 314 10*3/uL (ref 150–400)
RBC: 3.73 MIL/uL — ABNORMAL LOW (ref 3.87–5.11)
RDW: 14.6 % (ref 11.5–15.5)
WBC: 5.8 10*3/uL (ref 4.0–10.5)
nRBC: 0 % (ref 0.0–0.2)

## 2018-10-31 LAB — RETICULOCYTES
Immature Retic Fract: 10.2 % (ref 2.3–15.9)
RBC.: 3.73 MIL/uL — ABNORMAL LOW (ref 3.87–5.11)
Retic Count, Absolute: 67.9 10*3/uL (ref 19.0–186.0)
Retic Ct Pct: 1.8 % (ref 0.4–3.1)

## 2018-10-31 LAB — TSH: TSH: 0.623 u[IU]/mL (ref 0.350–4.500)

## 2018-10-31 LAB — IRON AND TIBC
Iron: 30 ug/dL (ref 28–170)
Saturation Ratios: 7 % — ABNORMAL LOW (ref 10.4–31.8)
TIBC: 405 ug/dL (ref 250–450)
UIBC: 375 ug/dL

## 2018-10-31 LAB — FOLATE: Folate: 60 ng/mL (ref >5.9)

## 2018-10-31 LAB — FERRITIN: Ferritin: 44 ng/mL (ref 11–307)

## 2018-10-31 MED ORDER — CYANOCOBALAMIN 1000 MCG/ML IJ SOLN
1000.0000 ug | Freq: Once | INTRAMUSCULAR | Status: AC
  Administered 2018-10-31: 1000 ug via INTRAMUSCULAR

## 2018-11-29 ENCOUNTER — Encounter: Payer: Self-pay | Admitting: Gastroenterology

## 2018-12-17 ENCOUNTER — Other Ambulatory Visit: Payer: Self-pay | Admitting: Hematology and Oncology

## 2018-12-17 ENCOUNTER — Other Ambulatory Visit: Payer: Self-pay | Admitting: Urgent Care

## 2018-12-19 ENCOUNTER — Other Ambulatory Visit: Payer: Self-pay

## 2018-12-19 ENCOUNTER — Inpatient Hospital Stay: Payer: Medicare Other | Attending: Hematology and Oncology

## 2018-12-19 DIAGNOSIS — E538 Deficiency of other specified B group vitamins: Secondary | ICD-10-CM | POA: Diagnosis not present

## 2018-12-19 DIAGNOSIS — D500 Iron deficiency anemia secondary to blood loss (chronic): Secondary | ICD-10-CM

## 2018-12-19 MED ORDER — CYANOCOBALAMIN 1000 MCG/ML IJ SOLN
1000.0000 ug | Freq: Once | INTRAMUSCULAR | Status: AC
  Administered 2018-12-19: 1000 ug via INTRAMUSCULAR

## 2018-12-26 ENCOUNTER — Other Ambulatory Visit: Payer: Self-pay | Admitting: Gastroenterology

## 2019-01-01 ENCOUNTER — Encounter: Payer: Self-pay | Admitting: Gastroenterology

## 2019-01-01 ENCOUNTER — Ambulatory Visit (INDEPENDENT_AMBULATORY_CARE_PROVIDER_SITE_OTHER): Payer: Medicare Other | Admitting: Gastroenterology

## 2019-01-01 DIAGNOSIS — R112 Nausea with vomiting, unspecified: Secondary | ICD-10-CM | POA: Diagnosis not present

## 2019-01-01 MED ORDER — SUCRALFATE 1 G PO TABS: 1.0000 g | ORAL_TABLET | Freq: Four times a day (QID) | ORAL | 1 refills | Status: DC

## 2019-01-01 NOTE — Progress Notes (Signed)
Jocelyn Antigua, MD [address]  [place]  [place], [place] [address]  Main: [phone]  Fax: [phone]   Primary Care Physician: [name], MD  Virtual Visit via Telephone Note  I connected with patient on 01/01/19 at 10:00 AM EDT by telephone and verified that I am speaking with the correct person using two identifiers.   I discussed the limitations, risks, security and privacy concerns of performing an evaluation and management service by telephone and the availability of in person appointments. I also discussed with the patient that there may be a patient responsible charge related to this service. The patient expressed understanding and agreed to proceed.  Location of Patient: Home Location of Provider: Home Persons involved: Patient and provider only   History of Present Illness: CC: Nausea and vomiting  HPI: Jocelyn Case is a 73 y.o. female who recently underwent endoscopic stent placement and then removal due to mild stenosis seen at the gastrojejunal anastomosis site.  The procedure was done by Dr. Darla Lesches.  Patient reports overall improvement and less frequency of nausea and vomiting since his procedure.  However, has noted 3 episodes of nausea and vomiting intermittently since removal of stent.  However, states that she has been able to eat more foods that she was unable to eat prior to the procedures.  Last episode occurred about a week ago and has not recurred since then.  No weight loss.  No dysphagia.  No altered bowel habits.  No blood in stool.  No hematemesis.  No abdominal pain.  Current Outpatient Medications  Medication Sig Dispense Refill  . Cholecalciferol (VITAMIN D3) 50 MCG (2000 UT) TABS Take 2,000 Units by mouth daily.    . cyanocobalamin (,VITAMIN B-12,) 1000 MCG/ML injection Inject 1,000 mcg into the muscle every 30 (thirty) days.    . famotidine (PEPCID) 40 MG tablet TK 1 T PO NIGHTLY  2  . folic acid (FOLVITE) 195 MCG tablet Take  400 mcg by mouth daily.     Marland Kitchen oxybutynin (DITROPAN-XL) 10 MG 24 hr tablet Take 10 mg by mouth at bedtime.   11  . colestipol (COLESTID) 1 g tablet Take 1 g by mouth.     . mirtazapine (REMERON) 15 MG tablet Take 15 mg by mouth at bedtime.     . pantoprazole (PROTONIX) 40 MG tablet Take 1 tablet (40 mg total) by mouth daily. 90 tablet 0  . rOPINIRole (REQUIP) 0.25 MG tablet Take 0.25 mg by mouth at bedtime.     . sucralfate (CARAFATE) 1 g tablet Take 1 tablet (1 g total) by mouth 4 (four) times daily. 360 tablet 1   No current facility-administered medications for this visit.     Allergies as of 01/01/2019 - Review Complete 01/01/2019  Allergen Reaction Noted  . Morphine Nausea And Vomiting 08/12/2018    Review of Systems:    All systems reviewed and negative except where noted in HPI.   Observations/Objective:  Labs: CMP     Component Value Date/Time   NA 135 09/14/2018 0430   K 3.8 09/14/2018 0430   CL 103 09/14/2018 0430   CO2 24 09/14/2018 0430   GLUCOSE 120 (H) 09/14/2018 0430   BUN 19 09/14/2018 0430   CREATININE 0.65 09/14/2018 0430   CALCIUM 8.3 (L) 09/14/2018 0430   PROT 5.6 (L) 09/14/2018 0430   ALBUMIN 2.9 (L) 09/14/2018 0430   AST 16 09/14/2018 0430   ALT 16 09/14/2018 0430   ALKPHOS 56 09/14/2018  0430   BILITOT 0.5 09/14/2018 0430   GFRNONAA >60 09/14/2018 0430   GFRAA >60 09/14/2018 0430   Lab Results  Component Value Date   WBC 5.8 10/31/2018   HGB 10.0 (L) 10/31/2018   HCT 34.3 (L) 10/31/2018   MCV 92.0 10/31/2018   PLT 314 10/31/2018    Imaging Studies: No results found.  Assessment and Plan:   Jocelyn Case is a 73 y.o. 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  However, due  to the return of her nausea vomiting intermittently since stent removal I have asked her to inform Dr. Juleen China office about her symptoms.  She states she will call them today to let them know.  However, she does state that she is not dehydrated and is eating well and has the episode has not occurred in at least a week, and therefore she does not feel that she needs another procedure right away.  However, she will call his office and let them know to update them on her symptoms.  Follow Up Instructions: Patient asked to call us if she is unable to reach their office or has any questions or concerns or if the nausea vomiting returns.    Follow-up in clinic in the next few months   I discussed the assessment and treatment plan with the patient. The patient was provided an opportunity to ask questions and all were answered. The patient agreed with the plan and demonstrated an understanding of the instructions.   The patient was advised to call back or seek an in-person evaluation if the symptoms worsen or if the condition fails to improve as anticipated.  I provided 6 minutes of non-face-to-face time during this encounter.   Virgel Manifold, MD  Speech recognition software was used to dictate this note.

## 2019-01-08 ENCOUNTER — Ambulatory Visit: Payer: Medicare Other | Admitting: Gastroenterology

## 2019-01-14 ENCOUNTER — Other Ambulatory Visit: Payer: Self-pay

## 2019-01-15 ENCOUNTER — Inpatient Hospital Stay: Payer: Medicare Other | Attending: Hematology and Oncology

## 2019-01-15 ENCOUNTER — Other Ambulatory Visit: Payer: Self-pay

## 2019-01-15 DIAGNOSIS — Z79899 Other long term (current) drug therapy: Secondary | ICD-10-CM | POA: Insufficient documentation

## 2019-01-15 DIAGNOSIS — D500 Iron deficiency anemia secondary to blood loss (chronic): Secondary | ICD-10-CM | POA: Diagnosis not present

## 2019-01-15 LAB — RETICULOCYTES
Immature Retic Fract: 10.4 % (ref 2.3–15.9)
RBC.: 3.65 MIL/uL — ABNORMAL LOW (ref 3.87–5.11)
Retic Count, Absolute: 59.9 10*3/uL (ref 19.0–186.0)
Retic Ct Pct: 1.6 % (ref 0.4–3.1)

## 2019-01-15 LAB — CBC
HCT: 34.8 % — ABNORMAL LOW (ref 36.0–46.0)
Hemoglobin: 10.7 g/dL — ABNORMAL LOW (ref 12.0–15.0)
MCH: 29.3 pg (ref 26.0–34.0)
MCHC: 30.7 g/dL (ref 30.0–36.0)
MCV: 95.3 fL (ref 80.0–100.0)
Platelets: 267 10*3/uL (ref 150–400)
RBC: 3.65 MIL/uL — ABNORMAL LOW (ref 3.87–5.11)
RDW: 14.6 % (ref 11.5–15.5)
WBC: 5.1 10*3/uL (ref 4.0–10.5)
nRBC: 0 % (ref 0.0–0.2)

## 2019-01-15 LAB — TSH: TSH: 0.397 u[IU]/mL (ref 0.350–4.500)

## 2019-01-15 LAB — IRON AND TIBC
Iron: 50 ug/dL (ref 28–170)
Saturation Ratios: 13 % (ref 10.4–31.8)
TIBC: 399 ug/dL (ref 250–450)
UIBC: 349 ug/dL

## 2019-01-15 LAB — SEDIMENTATION RATE: Sed Rate: 31 mm/hr — ABNORMAL HIGH (ref 0–30)

## 2019-01-15 LAB — DAT, POLYSPECIFIC AHG (ARMC ONLY): Polyspecific AHG test: NEGATIVE

## 2019-01-16 ENCOUNTER — Encounter: Payer: Self-pay | Admitting: Hematology and Oncology

## 2019-01-16 ENCOUNTER — Inpatient Hospital Stay: Payer: Medicare Other | Attending: Hematology and Oncology | Admitting: Hematology and Oncology

## 2019-01-16 ENCOUNTER — Ambulatory Visit: Payer: Medicare Other

## 2019-01-16 DIAGNOSIS — D500 Iron deficiency anemia secondary to blood loss (chronic): Secondary | ICD-10-CM

## 2019-01-16 DIAGNOSIS — E538 Deficiency of other specified B group vitamins: Secondary | ICD-10-CM | POA: Diagnosis not present

## 2019-01-16 NOTE — Addendum Note (Signed)
Addended by: Vito Berger on: 01/16/2019 02:21 PM   Modules accepted: Orders

## 2019-01-16 NOTE — Progress Notes (Signed)
[facility]  [address] [place], [place] [address] Phone: [phone]  Fax: [phone]   Telephone Office Visit:  01/16/2019  Referring physician: [name], MD  I connected with Jocelyn Case on 01/16/19 at 1:40 PM EDT by phone and verified that I was speaking with the correct person using 2 identifiers.  The patient was at home.  I discussed the limitations, risk, security and privacy concerns of performing an evaluation and management service by telephone and the availability of in person appointments.  I also discussed with the patient that there may be a patient responsible charge related to this service.  The patient expressed understanding and agreed to proceed.    Chief Complaint: Jocelyn Case is a 73 y.o. female s/p partial gastrectomy and iron deficiency who is evaluated for 3 month assessment.  HPI: The patient was last seen in the hematology clinic on 10/18/2018.  At that time, she felt sleepy.  A duodenal stent had been placed 3 days prior.  She had lost weight.  Exam was stable.  Hemoglobin was 9.4.  Ferritin was 54.  She did not need IV iron.  She has continued monthly B12 injections (10/31/2018 -  12/19/2018).  She was seen (telemedicine-telephone) by Dr. Bonna Gains on 01/01/2019 and Dr Cephas Darby on 01/06/2019.  Notes reviewed.  She underwent duodenal stent removal by Dr Cephas Darby on 11/29/2018.  He noted recurrent emesis.  Felt likely to have stenosis.  Endoscopy was moved up to 02/20/2019 and possible replacement of the stent.  It was felt that if she failed endoscopic therapy that surgical evaluation would be required.  Labs on 01/15/2019 included a hematocrit of 34.8, hemoglobin 10.7 and MCV 95.3.  Iron saturation was 13% with a TIBC of 399.  Reticulocyte count was 1.6%.  Sedimentation rate was 31.  TSH was normal.  Coombs was negative.    During the interim, she continues to struggle with GI issues.  She has chronic nausea and  emesis once a day.  She is eating a bland things and drinking Ensure high-protein (30 g) once a day.  She has no appetite.  She states that she cannot take the strong smell of foods.  She has lost 13 pounds since her last visit and is now down to 109 pounds.  She denies any bleeding.   Past Medical History:  Diagnosis Date   Anemia    Blood transfusion without reported diagnosis    Complication of anesthesia    GERD (gastroesophageal reflux disease)    PONV (postoperative nausea and vomiting)    Restless leg syndrome 2015   R leg    Sleep apnea     Past Surgical History:  Procedure Laterality Date   ABDOMINAL HYSTERECTOMY     APPENDECTOMY     CHOLECYSTECTOMY     COLONOSCOPY  2 years ago   ESOPHAGOGASTRODUODENOSCOPY (EGD) WITH PROPOFOL N/A 02/14/2018   Procedure: ESOPHAGOGASTRODUODENOSCOPY (EGD) WITH PROPOFOL;  Surgeon: Virgel Manifold, MD;  Location: [facility] ENDOSCOPY;  Service: Endoscopy;  Laterality: N/A;   ESOPHAGOGASTRODUODENOSCOPY (EGD) WITH PROPOFOL N/A 02/15/2018   Procedure: ESOPHAGOGASTRODUODENOSCOPY (EGD) WITH PROPOFOL;  Surgeon: Virgel Manifold, MD;  Location: [facility] ENDOSCOPY;  Service: Endoscopy;  Laterality: N/A;   ESOPHAGOGASTRODUODENOSCOPY (EGD) WITH PROPOFOL N/A 07/05/2018   Procedure: ESOPHAGOGASTRODUODENOSCOPY (EGD) WITH PROPOFOL;  Surgeon: Virgel Manifold, MD;  Location: [facility] ENDOSCOPY;  Service: Endoscopy;  Laterality: N/A;   STOMACH SURGERY  2013   UPPER GASTROINTESTINAL ENDOSCOPY  05/2017   pt  stated "bleeding ulcer and stopped up duodenum was noted"    Family History  Problem Relation Age of Onset   Peptic Ulcer Disease Mother    Dementia Mother    COPD Father    Colon cancer Father     Social History:  reports that she has never smoked. She has never used smokeless tobacco. She reports current alcohol use. She reports that she does not use drugs.  She drinks wine in the evening.  She previously lived in [place].  She moved to  [place] in 05/2017.  Her daughter lives in [place].  She is temporarily living in an apartment in [place].    Participants in the patient's visit and their role in the encounter included the patient and Vito Berger, CMA, today.  The intake visit was provided by Vito Berger, CMA.    Allergies:  Allergies  Allergen Reactions   Morphine Nausea And Vomiting    Current Medications: Current Outpatient Medications  Medication Sig Dispense Refill   Cholecalciferol (VITAMIN D3) 50 MCG (2000 UT) TABS Take 2,000 Units by mouth daily.     colestipol (COLESTID) 1 g tablet Take 1 g by mouth.      cyanocobalamin (,VITAMIN B-12,) 1000 MCG/ML injection Inject 1,000 mcg into the muscle every 30 (thirty) days.     famotidine (PEPCID) 40 MG tablet TK 1 T PO NIGHTLY  2   folic acid (FOLVITE) 811 MCG tablet Take 400 mcg by mouth daily.      mirtazapine (REMERON) 15 MG tablet Take 15 mg by mouth at bedtime.      oxybutynin (DITROPAN-XL) 10 MG 24 hr tablet Take 10 mg by mouth at bedtime.   11   pantoprazole (PROTONIX) 40 MG tablet Take 1 tablet (40 mg total) by mouth daily. 90 tablet 0   rOPINIRole (REQUIP) 0.25 MG tablet Take 0.25 mg by mouth at bedtime.      sucralfate (CARAFATE) 1 g tablet Take 1 tablet (1 g total) by mouth 4 (four) times daily. 360 tablet 1   No current facility-administered medications for this visit.     Review of Systems  Constitutional: Positive for malaise/fatigue and weight loss (13 pounds). Negative for chills, diaphoresis and fever.       "Zero energy".  HENT: Positive for hearing loss (hearing aides). Negative for congestion, ear pain, nosebleeds, sinus pain and sore throat.   Eyes: Negative.  Negative for blurred vision, photophobia and pain.  Respiratory: Negative.  Negative for cough, hemoptysis, sputum production and shortness of breath.   Cardiovascular: Negative.  Negative for chest pain, palpitations, orthopnea, leg swelling and  PND.  Gastrointestinal: Positive for nausea (chronic) and vomiting (once a day). Negative for abdominal pain, blood in stool, constipation, diarrhea, heartburn and melena.       S/p duodenal stent placement and removal.  Genitourinary: Negative.  Negative for dysuria, frequency, hematuria and urgency.  Musculoskeletal: Negative.  Negative for back pain, falls, joint pain, myalgias and neck pain.  Skin: Negative.  Negative for itching and rash.  Neurological: Negative for dizziness, tremors, sensory change, speech change, focal weakness, weakness and headaches.       Chronic restless legs.  Endo/Heme/Allergies: Negative.  Does not bruise/bleed easily.  Psychiatric/Behavioral: Negative.  Negative for depression and memory loss. The patient is not nervous/anxious and does not have insomnia.   All other systems reviewed and are negative.  Performance status (ECOG): 1   Clinical Support on 01/15/2019  Component Date Value Ref Range  Status   WBC 01/15/2019 5.1  4.0 - 10.5 K/uL Final   RBC 01/15/2019 3.65* 3.87 - 5.11 MIL/uL Final   Hemoglobin 01/15/2019 10.7* 12.0 - 15.0 g/dL Final   HCT 01/15/2019 34.8* 36.0 - 46.0 % Final   MCV 01/15/2019 95.3  80.0 - 100.0 fL Final   MCH 01/15/2019 29.3  26.0 - 34.0 pg Final   MCHC 01/15/2019 30.7  30.0 - 36.0 g/dL Final   RDW 01/15/2019 14.6  11.5 - 15.5 % Final   Platelets 01/15/2019 267  150 - 400 K/uL Final   nRBC 01/15/2019 0.0  0.0 - 0.2 % Final   Performed at [facility], [address] [place], [place] [address]   Iron 01/15/2019 50  28 - 170 ug/dL Final   TIBC 01/15/2019 399  250 - 450 ug/dL Final   Saturation Ratios 01/15/2019 13  10.4 - 31.8 % Final   UIBC 01/15/2019 349  ug/dL Final   Performed at [facility], [address] [place], [place] [address]   Sed Rate 01/15/2019 31* 0 - 30 mm/hr Final   Performed at [facility], [address] [place], [place] [address]   Retic Ct Pct 01/15/2019  1.6  0.4 - 3.1 % Final   RBC. 01/15/2019 3.65* 3.87 - 5.11 MIL/uL Final   Retic Count, Absolute 01/15/2019 59.9  19.0 - 186.0 K/uL Final   Immature Retic Fract 01/15/2019 10.4  2.3 - 15.9 % Final   Performed at [facility], [address] [place], [place] [address]   Polyspecific AHG test 01/15/2019    Final                   Value:NEG Performed at [facility], [address] [place], [place] [address]    TSH 01/15/2019 0.397  0.350 - 4.500 uIU/mL Final   Comment: Performed by a 3rd Generation assay with a functional sensitivity of <=0.01 uIU/mL. Performed at [facility], [address] [place], [place] [address]     Assessment:  Jocelyn Case is a 73 y.o. female s/p partial gastrectomy in 2009 and subsequent iron deficiency anemia.  She previously received IV iron off/on every 6 months for the past 3 years while living in [place].  She is intolerant of oral iron.  Work-up on 11/23/2017 revealed a hematocrit of 31, hemoglobin 9.6, MCV 84.7, platelets 379,000, WBC 5600 with an ANC of 3200.  Ferritin was 7.  B12 was 267.  Folate was 28.  She received Venofer weekly x 4 (11/30/2017 - 12/21/2017) and x 2 (04/16/2018 - 04/23/2018).  Ferritin has been followed: 5 on 11/05/2017, 7 on 11/23/2017, 71 on 01/04/2018, 45 on 02/15/2018, 12 on 04/08/2018, 130 on 05/21/2018, 132 on 07/10/2018, 54 on 10/17/2018 and 44 on 10/31/2018.  She receives B12 IM monthly (last 12/19/2018).  She is on folic acid.  Folate was 60 on 10/31/2018.  EGD on  02/15/2018 revealed non-bleeding gastric ulcers at the anastomosis, with no stigmata of bleeding. Biopsied.  A Billroth II anastomosis characterized by ulceration and mild stenosis was dilated.  Jejunal polyp(s) were biopsied. Pathology revealed no evidence of malignancy.  EGD on 07/05/2018 revealed non-bleeding gastric ulcer with no stigmata of bleeding.  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  Duodenal stent was placed on 10/15/2018 and removed on 11/29/2018.  Symptomatically, she remains fatigued.  Since stent removal, she has had recurrent nausea and once a day emesis.  Weight is  109 pounds (down 32 pounds in 1 year).  Plan: 1. Review labs from yesterday. 2. Iron deficency anemia Hemoglobin is 10.7.  MCV is 95.3. Iron saturation 13% with a TIBC of 399. Ferritin was 44 on 10/31/2018. No plan for IV iron. 3. B12 deficiency Patient continues monthly B12 injections. Last B12 on 12/19/2018. Folate was normal on 10/31/2018. Schedule B12 monthly injections x 3 (due now). 4. Weight loss  Discuss interval weight loss and caloric intake.  Patient is s/p duodenal stent placement.  Diet is low fiber and low-fat.  She has ongoing chronic nausea and once a day emesis.  Discuss plan to follow-up with Dr. Cephas Darby.     She may require surgery as stent temporary.   Discuss no elective surgeries during COVID-19 pandemic.  Discuss need to improve nutritional status. 5.   RTC in 3 months for MD assessment, labs (CBC, ferritin- day before), and +/- Venofer.   I discussed the assessment and treatment plan with the patient.  The patient was provided an opportunity to ask questions and all were answered.  The patient agreed with the plan and demonstrated an understanding of the instructions.  The patient was advised to call back or seek an in person evaluation if the symptoms worsen or if the condition fails to improve as anticipated.  I provided 20 minutes (1:40 PM - 2:00 PM) of non-face-to-face time during this encounter.  I provided these services from the [facility] office .   Lequita Asal, MD, PhD  01/16/2019, 1:40 PM

## 2019-01-16 NOTE — Progress Notes (Signed)
No new changes noted today. The patient DOB, Name and address has been verified today boy phone. Cbg, CMA

## 2019-01-23 ENCOUNTER — Other Ambulatory Visit: Payer: Self-pay

## 2019-01-23 ENCOUNTER — Inpatient Hospital Stay: Payer: Medicare Other

## 2019-01-23 DIAGNOSIS — D500 Iron deficiency anemia secondary to blood loss (chronic): Secondary | ICD-10-CM

## 2019-01-23 DIAGNOSIS — E538 Deficiency of other specified B group vitamins: Secondary | ICD-10-CM | POA: Diagnosis not present

## 2019-01-23 MED ORDER — CYANOCOBALAMIN 1000 MCG/ML IJ SOLN
1000.0000 ug | Freq: Once | INTRAMUSCULAR | Status: AC
  Administered 2019-01-23: 13:00:00 1000 ug via INTRAMUSCULAR

## 2019-02-21 ENCOUNTER — Inpatient Hospital Stay: Payer: Medicare Other | Attending: Hematology and Oncology

## 2019-02-21 ENCOUNTER — Other Ambulatory Visit: Payer: Self-pay

## 2019-02-21 DIAGNOSIS — D500 Iron deficiency anemia secondary to blood loss (chronic): Secondary | ICD-10-CM

## 2019-02-21 DIAGNOSIS — E538 Deficiency of other specified B group vitamins: Secondary | ICD-10-CM | POA: Insufficient documentation

## 2019-02-21 MED ORDER — CYANOCOBALAMIN 1000 MCG/ML IJ SOLN
1000.0000 ug | Freq: Once | INTRAMUSCULAR | Status: AC
  Administered 2019-02-21: 1000 ug via INTRAMUSCULAR

## 2019-03-25 ENCOUNTER — Other Ambulatory Visit: Payer: Self-pay

## 2019-03-26 ENCOUNTER — Other Ambulatory Visit: Payer: Self-pay

## 2019-03-26 ENCOUNTER — Inpatient Hospital Stay: Payer: Medicare Other | Attending: Hematology and Oncology

## 2019-03-26 DIAGNOSIS — E538 Deficiency of other specified B group vitamins: Secondary | ICD-10-CM | POA: Insufficient documentation

## 2019-03-26 DIAGNOSIS — D500 Iron deficiency anemia secondary to blood loss (chronic): Secondary | ICD-10-CM | POA: Diagnosis present

## 2019-03-26 LAB — CBC WITH DIFFERENTIAL/PLATELET
Abs Immature Granulocytes: 0.02 10*3/uL (ref 0.00–0.07)
Basophils Absolute: 0 10*3/uL (ref 0.0–0.1)
Basophils Relative: 1 %
Eosinophils Absolute: 0.5 10*3/uL (ref 0.0–0.5)
Eosinophils Relative: 11 %
HCT: 34.5 % — ABNORMAL LOW (ref 36.0–46.0)
Hemoglobin: 10.4 g/dL — ABNORMAL LOW (ref 12.0–15.0)
Immature Granulocytes: 0 %
Lymphocytes Relative: 32 %
Lymphs Abs: 1.5 10*3/uL (ref 0.7–4.0)
MCH: 27.9 pg (ref 26.0–34.0)
MCHC: 30.1 g/dL (ref 30.0–36.0)
MCV: 92.5 fL (ref 80.0–100.0)
Monocytes Absolute: 0.5 10*3/uL (ref 0.1–1.0)
Monocytes Relative: 11 %
Neutro Abs: 2.2 10*3/uL (ref 1.7–7.7)
Neutrophils Relative %: 45 %
Platelets: 250 10*3/uL (ref 150–400)
RBC: 3.73 MIL/uL — ABNORMAL LOW (ref 3.87–5.11)
RDW: 15.7 % — ABNORMAL HIGH (ref 11.5–15.5)
WBC: 4.8 10*3/uL (ref 4.0–10.5)
nRBC: 0 % (ref 0.0–0.2)

## 2019-03-26 LAB — FERRITIN: Ferritin: 11 ng/mL (ref 11–307)

## 2019-03-26 NOTE — Progress Notes (Signed)
[facility]  [address] [place],  [address] Phone: [phone]  Fax: [phone]   Clinic Day:  03/27/2019  Referring physician: [name], MD  Chief Complaint: Jocelyn Case is a 73 y.o. female with s/p partial gastrectomy with B12 deficiency and iron deficiency who is seen for 2 month assessment.   HPI: The patient was last seen in the hematology clinic on 01/16/2019. At that time, she remained fatigued. Since her duodenal stent removal, she had recurrent nausea and once a day emesis. Weight was 109 pounds (down 32 pounds in 1 year). Hemoglobin was 10.7, hematocrit 34.8, iron saturation 13%.  EGD on 02/20/2019 by Dr. Cephas Darby at [facility] revealed gastrointestinal mucosa with chronic active inflammation, marked erosion and reactive epithelial changes. Immunohistochemistry for Helicobacter pylori was negative. Immunohistochemistry for cytomegalovirus was negative.  She received B12 injections on 01/23/2019 and 02/21/2019.   Labs on 03/25/2019: WBC 4,800, Hemoglobin 10.4, hematocrit 34.5, platelets 250,000. Ferritin 11.   During the interim, she is doing well. Her energy level has improved. She denies any fevers. She is 118lbs, up 9lbs since 01/2019 and is eating better. She denies any nausea or vomiting. She notes heartburn. She continues to have restless legs. She denies any ice pica.   Her stent, placed in April is coming out in 05/2019. If she does not recover after the removal she will have to undergo another surgery with Dr. Cephas Darby.    Past Medical History:  Diagnosis Date   Anemia    Blood transfusion without reported diagnosis    Complication of anesthesia    GERD (gastroesophageal reflux disease)    PONV (postoperative nausea and vomiting)    Restless leg syndrome 2015   R leg    Sleep apnea     Past Surgical History:  Procedure Laterality Date   ABDOMINAL HYSTERECTOMY     APPENDECTOMY     CHOLECYSTECTOMY       COLONOSCOPY  2 years ago   ESOPHAGOGASTRODUODENOSCOPY (EGD) WITH PROPOFOL N/A 02/14/2018   Procedure: ESOPHAGOGASTRODUODENOSCOPY (EGD) WITH PROPOFOL;  Surgeon: Virgel Manifold, MD;  Location: [facility] ENDOSCOPY;  Service: Endoscopy;  Laterality: N/A;   ESOPHAGOGASTRODUODENOSCOPY (EGD) WITH PROPOFOL N/A 02/15/2018   Procedure: ESOPHAGOGASTRODUODENOSCOPY (EGD) WITH PROPOFOL;  Surgeon: Virgel Manifold, MD;  Location: [facility] END[facility];  Service: Endoscopy;  Laterality: N/A;   ESOPHAGOGASTRODUODENOSCOPY (EGD) WITH PROPOFOL N/A 07/05/2018   Procedure: ESOPHAGOGASTRODUODENOSCOPY (EGD) WITH PROPOFOL;  Surgeon: Virgel Manifold, MD;  Location: [facility] END[facility];  Service: Endoscopy;  Laterality: N/A;   STOMACH SURGERY  2013   UPPER GASTROINTESTINAL ENDOSCOPY  05/2017   pt stated "bleeding ulcer and stopped up duodenum was noted"    Family History  Problem Relation Age of Onset   Peptic Ulcer Disease Mother    Dementia Mother    COPD Father    Colon cancer Father     Social History:  reports that she has never smoked. She has never used smokeless tobacco. She reports current alcohol use. She reports that she does not use drugs. She drinks wine in the evening.  She previously lived in [place]. She moved to [place] in 05/2017.  Her daughter lives in [place]. She is temporarily living in an apartment in [place]. She has a Therapist, occupational. She is alone today.   Allergies:  Allergies  Allergen Reactions   Morphine Nausea And Vomiting    Current Medications: Current Outpatient Medications  Medication Sig Dispense Refill   Cholecalciferol (VITAMIN D3) 50  MCG (2000 UT) TABS Take 2,000 Units by mouth daily.     cimetidine (TAGAMET) 400 MG tablet Take 400 mg by mouth 2 (two) times daily.      colestipol (COLESTID) 1 g tablet Take 1 g by mouth.      cyanocobalamin (,VITAMIN B-12,) 1000 MCG/ML injection Inject 1,000 mcg into the muscle every 30 (thirty) days.      folic acid (FOLVITE) 270 MCG tablet Take 400 mcg by mouth daily.      mirtazapine (REMERON) 15 MG tablet Take 15 mg by mouth at bedtime.      oxybutynin (DITROPAN-XL) 10 MG 24 hr tablet Take 10 mg by mouth at bedtime.   11   pantoprazole (PROTONIX) 40 MG tablet Take 1 tablet (40 mg total) by mouth daily. 90 tablet 0   rOPINIRole (REQUIP) 0.25 MG tablet Take 0.25 mg by mouth at bedtime.      sucralfate (CARAFATE) 1 g tablet Take 1 tablet (1 g total) by mouth 4 (four) times daily. 360 tablet 1   No current facility-administered medications for this visit.     Review of Systems  Constitutional: Positive for malaise/fatigue (energy levels improving). Negative for chills, diaphoresis, fever and weight loss (Up 9lbs).  HENT: Positive for hearing loss (hearing aides). Negative for congestion, ear pain, nosebleeds, sinus pain and sore throat.   Eyes: Negative.  Negative for blurred vision, photophobia and pain.  Respiratory: Negative.  Negative for cough, hemoptysis, sputum production and shortness of breath.   Cardiovascular: Negative.  Negative for chest pain, palpitations, orthopnea, leg swelling and PND.  Gastrointestinal: Positive for heartburn. Negative for abdominal pain, blood in stool, constipation, diarrhea, melena, nausea (chronic, resolved) and vomiting (resolved).       S/p duodenal stent placement. No ice pica.  Genitourinary: Negative.  Negative for dysuria, frequency, hematuria and urgency.  Musculoskeletal: Negative.  Negative for back pain, falls, joint pain, myalgias and neck pain.  Skin: Negative.  Negative for itching and rash.  Neurological: Positive for tremors (Chronic restless legs). Negative for dizziness, sensory change, speech change, focal weakness, weakness and headaches.  Endo/Heme/Allergies: Negative.  Does not bruise/bleed easily.  Psychiatric/Behavioral: Negative.  Negative for depression and memory loss. The patient is not nervous/anxious and does not have  insomnia.   All other systems reviewed and are negative.  Performance status (ECOG): 1  Vitals: Blood pressure 112/65, pulse 89, temperature 97.8 F (36.6 C), temperature source Oral, resp. rate 16, weight 118 lb 15 oz (53.9 kg), SpO2 99 %.   Physical Exam  Constitutional: She is oriented to person, place, and time. She appears well-developed and well-nourished. No distress.  HENT:  Head: Normocephalic and atraumatic.  Mouth/Throat: Oropharynx is clear and moist. No oropharyngeal exudate.  Blonde short hair. Mask.  Eyes: Pupils are equal, round, and reactive to light. Conjunctivae and EOM are normal. No scleral icterus.  Glasses. Blue eyes.  Neck: Normal range of motion. Neck supple. No JVD present.  Cardiovascular: Normal rate, regular rhythm and normal heart sounds.  No murmur heard. Pulmonary/Chest: Effort normal and breath sounds normal. No respiratory distress. She has no wheezes. She has no rales.  Abdominal: Soft. Bowel sounds are normal. She exhibits no distension. There is no abdominal tenderness. There is no rebound and no guarding.  Musculoskeletal: Normal range of motion.        General: No edema.  Lymphadenopathy:    She has no cervical adenopathy.    She has no axillary adenopathy.  Right: No supraclavicular adenopathy present.       Left: No supraclavicular adenopathy present.  Neurological: She is alert and oriented to person, place, and time.  Skin: Skin is warm and dry. No rash noted. She is not diaphoretic. No erythema.  Psychiatric: She has a normal mood and affect. Her behavior is normal. Judgment and thought content normal.  Nursing note and vitals reviewed.   Orders Only on 03/26/2019  Component Date Value Ref Range Status   Ferritin 03/26/2019 11  11 - 307 ng/mL Final   Performed at [facility], [address] [place], [place] [address]   WBC 03/26/2019 4.8  4.0 - 10.5 K/uL Final   RBC 03/26/2019 3.73* 3.87 - 5.11 MIL/uL Final    Hemoglobin 03/26/2019 10.4* 12.0 - 15.0 g/dL Final   HCT 03/26/2019 34.5* 36.0 - 46.0 % Final   MCV 03/26/2019 92.5  80.0 - 100.0 fL Final   MCH 03/26/2019 27.9  26.0 - 34.0 pg Final   MCHC 03/26/2019 30.1  30.0 - 36.0 g/dL Final   RDW 03/26/2019 15.7* 11.5 - 15.5 % Final   Platelets 03/26/2019 250  150 - 400 K/uL Final   nRBC 03/26/2019 0.0  0.0 - 0.2 % Final   Neutrophils Relative % 03/26/2019 45  % Final   Neutro Abs 03/26/2019 2.2  1.7 - 7.7 K/uL Final   Lymphocytes Relative 03/26/2019 32  % Final   Lymphs Abs 03/26/2019 1.5  0.7 - 4.0 K/uL Final   Monocytes Relative 03/26/2019 11  % Final   Monocytes Absolute 03/26/2019 0.5  0.1 - 1.0 K/uL Final   Eosinophils Relative 03/26/2019 11  % Final   Eosinophils Absolute 03/26/2019 0.5  0.0 - 0.5 K/uL Final   Basophils Relative 03/26/2019 1  % Final   Basophils Absolute 03/26/2019 0.0  0.0 - 0.1 K/uL Final   Immature Granulocytes 03/26/2019 0  % Final   Abs Immature Granulocytes 03/26/2019 0.02  0.00 - 0.07 K/uL Final   Performed at [facility], [address] [place], [place] [address]    Assessment:  Jocelyn Case is a 73 y.o. female  s/p partial gastrectomy in 2009 and subsequent iron deficiency anemia.  She previously received IV iron off/on every 6 months for the past 3 years while living in [place].  She is intolerant of oral iron.  Work-up on 11/23/2017 revealed a hematocrit of 31, hemoglobin 9.6, MCV 84.7, platelets 379,000, WBC 5600 with an ANC of 3200.  Ferritin was 7.  B12 was 267.  Folate was 28.  She received Venofer weekly x 4 (11/30/2017 - 12/21/2017) and x 2 (04/16/2018 - 04/23/2018).  Ferritin has been followed: 5 on 11/05/2017, 7 on 11/23/2017, 71 on 01/04/2018, 45 on 02/15/2018, 12 on 04/08/2018, 130 on 05/21/2018, 132 on 07/10/2018, 54 on 10/17/2018, 44 on 10/31/2018, and 11 on 03/25/2019.   She receives B12 IM monthly (last 02/21/2019).  She is on folic acid.  Folate was 60 on  10/31/2018.  EGD on  02/15/2018 revealed non-bleeding gastric ulcers at the anastomosis, with no stigmata of bleeding. Biopsied.  A Billroth II anastomosis characterized by ulceration and mild stenosis was dilated.  Jejunal polyp(s) were biopsied. Pathology revealed no evidence of malignancy.  EGD on 07/05/2018 revealed non-bleeding gastric ulcer with no stigmata of bleeding.  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  Duodenal stent was placed on 10/15/2018 and removed on 11/29/2018.  EGD on 02/20/2019 revealed gastrointestinal mucosa with chronic active inflammation,  marked erosion and reactive epithelial changes.  H pylori and CMV were negative.  Symptomatically, she is doing well.  Energy level has improved.  She denies pica.  Exam is unremarkable.  Plan: 1.   Review labs from yesterday. 2.   Iron deficency anemia Hemoglobin is 10.4.  MCV is 92.5. Ferritin is 11. Venofer today in 1 week. 3.   B12 deficiency B12 today and monthly x5. Check folate periodically. 4.   Weight loss             Weight is up 9 pounds.    Patient status post stent replacement.             Follow-up as scheduled with Dr. Cephas Darby. 5.   RTC in 3 months for MD assessment, labs (CBC, ferritin- day before), and +/- Venofer.  I discussed the assessment and treatment plan with the patient.  The patient was provided an opportunity to ask questions and all were answered.  The patient agreed with the plan and demonstrated an understanding of the instructions.  The patient was advised to call back if the symptoms worsen or if the condition fails to improve as anticipated.    Lequita Asal, MD, PhD    03/27/2019, 9:26 AM  I, Molly Dorshimer, am acting as Education administrator for [employer]. Mike Gip, MD, PhD.  I, Mallisa Alameda C. Mike Gip, MD, have reviewed the above documentation for accuracy and completeness, and I agree with the above.

## 2019-03-27 ENCOUNTER — Inpatient Hospital Stay (HOSPITAL_BASED_OUTPATIENT_CLINIC_OR_DEPARTMENT_OTHER): Payer: Medicare Other | Admitting: Hematology and Oncology

## 2019-03-27 ENCOUNTER — Inpatient Hospital Stay: Payer: Medicare Other

## 2019-03-27 ENCOUNTER — Encounter: Payer: Self-pay | Admitting: Hematology and Oncology

## 2019-03-27 ENCOUNTER — Other Ambulatory Visit: Payer: Medicare Other

## 2019-03-27 VITALS — BP 112/65 | HR 89 | Temp 97.8°F | Resp 16 | Wt 118.9 lb

## 2019-03-27 DIAGNOSIS — D509 Iron deficiency anemia, unspecified: Secondary | ICD-10-CM

## 2019-03-27 DIAGNOSIS — D500 Iron deficiency anemia secondary to blood loss (chronic): Secondary | ICD-10-CM

## 2019-03-27 DIAGNOSIS — E538 Deficiency of other specified B group vitamins: Secondary | ICD-10-CM | POA: Diagnosis not present

## 2019-03-27 MED ORDER — CYANOCOBALAMIN 1000 MCG/ML IJ SOLN
1000.0000 ug | Freq: Once | INTRAMUSCULAR | Status: AC
  Administered 2019-03-27: 1000 ug via INTRAMUSCULAR

## 2019-03-27 MED ORDER — IRON SUCROSE 20 MG/ML IV SOLN
200.0000 mg | Freq: Once | INTRAVENOUS | Status: AC
  Administered 2019-03-27: 200 mg via INTRAVENOUS

## 2019-03-27 MED ORDER — SODIUM CHLORIDE 0.9 % IV SOLN
INTRAVENOUS | Status: DC
  Administered 2019-03-27: 10:00:00 via INTRAVENOUS
  Filled 2019-03-27: qty 250

## 2019-03-27 NOTE — Progress Notes (Signed)
Pt here for follow up. Denies any concerns at this time.  

## 2019-04-03 ENCOUNTER — Inpatient Hospital Stay: Payer: Medicare Other | Attending: Hematology and Oncology

## 2019-04-03 ENCOUNTER — Other Ambulatory Visit: Payer: Self-pay

## 2019-04-03 VITALS — BP 109/70 | HR 78 | Temp 97.8°F | Resp 16

## 2019-04-03 DIAGNOSIS — D500 Iron deficiency anemia secondary to blood loss (chronic): Secondary | ICD-10-CM | POA: Insufficient documentation

## 2019-04-03 DIAGNOSIS — E538 Deficiency of other specified B group vitamins: Secondary | ICD-10-CM | POA: Insufficient documentation

## 2019-04-03 MED ORDER — IRON SUCROSE 20 MG/ML IV SOLN
200.0000 mg | Freq: Once | INTRAVENOUS | Status: AC
  Administered 2019-04-03: 200 mg via INTRAVENOUS

## 2019-04-03 MED ORDER — SODIUM CHLORIDE 0.9 % IV SOLN
Freq: Once | INTRAVENOUS | Status: AC
  Administered 2019-04-03: 14:00:00 via INTRAVENOUS
  Filled 2019-04-03: qty 250

## 2019-04-03 NOTE — Patient Instructions (Signed)

## 2019-04-24 ENCOUNTER — Other Ambulatory Visit: Payer: Self-pay

## 2019-04-24 ENCOUNTER — Inpatient Hospital Stay: Payer: Medicare Other

## 2019-04-24 VITALS — BP 118/66 | HR 75 | Temp 98.4°F | Resp 16

## 2019-04-24 DIAGNOSIS — D500 Iron deficiency anemia secondary to blood loss (chronic): Secondary | ICD-10-CM

## 2019-04-24 MED ORDER — CYANOCOBALAMIN 1000 MCG/ML IJ SOLN
1000.0000 ug | Freq: Once | INTRAMUSCULAR | Status: AC
  Administered 2019-04-24: 1000 ug via INTRAMUSCULAR

## 2019-04-24 NOTE — Patient Instructions (Signed)

## 2019-05-22 ENCOUNTER — Other Ambulatory Visit: Payer: Self-pay

## 2019-05-22 ENCOUNTER — Inpatient Hospital Stay: Payer: Medicare Other | Attending: Hematology and Oncology

## 2019-05-22 VITALS — BP 103/65 | HR 71 | Temp 98.2°F | Resp 17

## 2019-05-22 DIAGNOSIS — D500 Iron deficiency anemia secondary to blood loss (chronic): Secondary | ICD-10-CM

## 2019-05-22 DIAGNOSIS — E538 Deficiency of other specified B group vitamins: Secondary | ICD-10-CM | POA: Diagnosis present

## 2019-05-22 MED ORDER — CYANOCOBALAMIN 1000 MCG/ML IJ SOLN
1000.0000 ug | Freq: Once | INTRAMUSCULAR | Status: AC
  Administered 2019-05-22: 1000 ug via INTRAMUSCULAR

## 2019-06-19 ENCOUNTER — Inpatient Hospital Stay: Payer: Medicare Other | Attending: Hematology and Oncology

## 2019-06-19 ENCOUNTER — Inpatient Hospital Stay: Payer: Medicare Other

## 2019-06-19 ENCOUNTER — Other Ambulatory Visit: Payer: Self-pay

## 2019-06-19 VITALS — BP 108/71 | HR 82 | Temp 96.8°F | Resp 18

## 2019-06-19 DIAGNOSIS — D500 Iron deficiency anemia secondary to blood loss (chronic): Secondary | ICD-10-CM

## 2019-06-19 DIAGNOSIS — E538 Deficiency of other specified B group vitamins: Secondary | ICD-10-CM | POA: Diagnosis not present

## 2019-06-19 DIAGNOSIS — D509 Iron deficiency anemia, unspecified: Secondary | ICD-10-CM | POA: Insufficient documentation

## 2019-06-19 LAB — CBC WITH DIFFERENTIAL/PLATELET
Abs Immature Granulocytes: 0.04 10*3/uL (ref 0.00–0.07)
Basophils Absolute: 0 10*3/uL (ref 0.0–0.1)
Basophils Relative: 0 %
Eosinophils Absolute: 0.3 10*3/uL (ref 0.0–0.5)
Eosinophils Relative: 5 %
HCT: 36.8 % (ref 36.0–46.0)
Hemoglobin: 11.3 g/dL — ABNORMAL LOW (ref 12.0–15.0)
Immature Granulocytes: 1 %
Lymphocytes Relative: 22 %
Lymphs Abs: 1.5 10*3/uL (ref 0.7–4.0)
MCH: 29.8 pg (ref 26.0–34.0)
MCHC: 30.7 g/dL (ref 30.0–36.0)
MCV: 97.1 fL (ref 80.0–100.0)
Monocytes Absolute: 0.5 10*3/uL (ref 0.1–1.0)
Monocytes Relative: 7 %
Neutro Abs: 4.6 10*3/uL (ref 1.7–7.7)
Neutrophils Relative %: 65 %
Platelets: 246 10*3/uL (ref 150–400)
RBC: 3.79 MIL/uL — ABNORMAL LOW (ref 3.87–5.11)
RDW: 14.2 % (ref 11.5–15.5)
WBC: 7 10*3/uL (ref 4.0–10.5)
nRBC: 0 % (ref 0.0–0.2)

## 2019-06-19 LAB — FERRITIN: Ferritin: 32 ng/mL (ref 11–307)

## 2019-06-19 MED ORDER — CYANOCOBALAMIN 1000 MCG/ML IJ SOLN
1000.0000 ug | Freq: Once | INTRAMUSCULAR | Status: AC
  Administered 2019-06-19: 1000 ug via INTRAMUSCULAR

## 2019-06-20 ENCOUNTER — Telehealth: Payer: Self-pay

## 2019-06-20 NOTE — Telephone Encounter (Signed)
Spoke with the patient to inform her that her CBC and Ferritin was borderline and if symptomatic she can get 1 round of Venofer. The patient was understanding and agreeable to get Venofer x 1

## 2019-06-20 NOTE — Telephone Encounter (Signed)
-----   Message from Lequita Asal, MD sent at 06/20/2019 11:50 AM EDT ----- Regarding: Please call patient  Review CBC and ferritin (borderline).  Receives Venofer if ferritin < 30.  If symptomatic, can schedule for Venofer x 1.  M ----- Message ----- From: Buel Ream, Lab In [place] Sent: 06/19/2019   1:45 PM EDT To: Lequita Asal, MD

## 2019-06-23 ENCOUNTER — Other Ambulatory Visit: Payer: Self-pay

## 2019-06-24 ENCOUNTER — Inpatient Hospital Stay: Payer: Medicare Other | Attending: Hematology and Oncology

## 2019-06-24 VITALS — BP 102/67 | HR 90 | Temp 96.7°F | Resp 20

## 2019-06-24 DIAGNOSIS — D500 Iron deficiency anemia secondary to blood loss (chronic): Secondary | ICD-10-CM

## 2019-06-24 DIAGNOSIS — D509 Iron deficiency anemia, unspecified: Secondary | ICD-10-CM | POA: Diagnosis present

## 2019-06-24 MED ORDER — SODIUM CHLORIDE 0.9 % IV SOLN
Freq: Once | INTRAVENOUS | Status: AC
  Administered 2019-06-24: 13:00:00 via INTRAVENOUS
  Filled 2019-06-24: qty 250

## 2019-06-24 MED ORDER — IRON SUCROSE 20 MG/ML IV SOLN
200.0000 mg | Freq: Once | INTRAVENOUS | Status: AC
  Administered 2019-06-24: 200 mg via INTRAVENOUS

## 2019-06-24 NOTE — Patient Instructions (Signed)

## 2019-07-14 ENCOUNTER — Other Ambulatory Visit: Payer: Self-pay

## 2019-07-16 ENCOUNTER — Encounter: Payer: Self-pay | Admitting: Gastroenterology

## 2019-07-16 ENCOUNTER — Other Ambulatory Visit: Payer: Self-pay

## 2019-07-16 ENCOUNTER — Ambulatory Visit (INDEPENDENT_AMBULATORY_CARE_PROVIDER_SITE_OTHER): Payer: Medicare Other | Admitting: Gastroenterology

## 2019-07-16 VITALS — BP 116/65 | HR 80 | Temp 97.8°F | Wt 119.5 lb

## 2019-07-16 DIAGNOSIS — K219 Gastro-esophageal reflux disease without esophagitis: Secondary | ICD-10-CM

## 2019-07-16 MED ORDER — PANTOPRAZOLE SODIUM 40 MG PO TBEC: 40.0000 mg | DELAYED_RELEASE_TABLET | Freq: Two times a day (BID) | ORAL | 0 refills | Status: DC

## 2019-07-16 NOTE — Progress Notes (Signed)
p 

## 2019-07-16 NOTE — Progress Notes (Signed)
[name], MD [address]  [place]  [place], [place] [address]  Main: [phone]  Fax: [phone]   Primary Care Physician: [name], MD   Chief Complaint  Patient presents with  . Nausea    HPI: Shawntai Gaur is a 73 y.o. female also sees Dr. 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  Last removed in August 2020.  With good results patient reports improvement in symptoms since the stent placement and removal.  Has follow-up with them scheduled in the next month to see if symptoms return and if surgery referral is needed  Is taking Protonix once daily but is complaining of reflux symptoms with sore throat a few times a week.  No dysphagia.  Current Outpatient Medications  Medication Sig Dispense Refill  . Cholecalciferol (VITAMIN D3) 50 MCG (2000 UT) TABS Take 2,000 Units by mouth daily.    . cimetidine (TAGAMET) 400 MG tablet Take 400 mg by mouth 2 (two) times daily.     . colestipol (COLESTID) 1 g tablet Take 1 g by mouth.     . cyanocobalamin (,VITAMIN B-12,) 1000 MCG/ML injection Inject 1,000 mcg into the muscle every 30 (thirty) days.    . famotidine (PEPCID) 20 MG tablet Take by mouth.    . folic acid (FOLVITE) A999333 MCG tablet Take 400 mcg by mouth daily.     . mirtazapine (REMERON) 15 MG tablet Take 15 mg by mouth at bedtime.     Marland Kitchen oxybutynin (DITROPAN-XL) 10 MG 24 hr tablet Take 10 mg by mouth at bedtime.   11  . sucralfate (CARAFATE) 1 g tablet Take 1 tablet (1 g total) by mouth 4 (four) times daily. 360 tablet 1  . pantoprazole (PROTONIX) 40 MG tablet Take 1 tablet (40 mg total) by mouth daily. 90 tablet 0  . pantoprazole (PROTONIX) 40 MG tablet Take 1 tablet (40 mg total) by mouth 2 (two) times daily. 60 tablet 0  . rOPINIRole (REQUIP) 0.25 MG tablet Take 0.25 mg by mouth at bedtime.      No current facility-administered medications for this visit.      Allergies as of 07/16/2019 - Review Complete 07/16/2019  Allergen Reaction Noted  . Morphine Nausea And Vomiting 08/12/2018    ROS:  General: Negative for anorexia, weight loss, fever, chills, fatigue, weakness. ENT: Negative for hoarseness, difficulty swallowing , nasal congestion. CV: Negative for chest pain, angina, palpitations, dyspnea on exertion, peripheral edema.  Respiratory: Negative for dyspnea at rest, dyspnea on exertion, cough, sputum, wheezing.  GI: See history of present illness. GU:  Negative for dysuria, hematuria, urinary incontinence, urinary frequency, nocturnal urination.  Endo: Negative for unusual weight change.    Physical Examination:   BP 116/65 (BP Location: Left Arm, Patient Position: Sitting, Cuff Size: Normal)   Pulse 80   Temp 97.8 F (36.6 C) (Oral)   Wt 119 lb 8 oz (54.2 kg)   BMI 21.86 kg/m   General: Well-nourished, well-developed in no acute distress.  Eyes: No icterus. Conjunctivae pink. Mouth: Oropharyngeal mucosa moist and pink , no lesions erythema or exudate. Neck: Supple, Trachea midline Abdomen: Bowel sounds are normal, nontender, nondistended, no hepatosplenomegaly or masses, no abdominal bruits or hernia , no rebound or guarding.   Extremities: No lower extremity edema. No clubbing or deformities. Neuro: Alert and oriented x 3.  Grossly intact. Skin: Warm and dry, no jaundice.  Psych: Alert and cooperative, normal mood and affect.   Labs: CMP     Component Value Date/Time   NA 135 09/14/2018 0430   K 3.8 09/14/2018 0430   CL 103 09/14/2018 0430   CO2 24 09/14/2018 0430   GLUCOSE 120 (H) 09/14/2018 0430   BUN 19 09/14/2018 0430   CREATININE 0.65 09/14/2018 0430   CALCIUM 8.3 (L) 09/14/2018 0430   PROT 5.6 (L) 09/14/2018 0430   ALBUMIN 2.9 (L) 09/14/2018 0430   AST 16 09/14/2018 0430   ALT 16 09/14/2018 0430   ALKPHOS 56 09/14/2018 0430   BILITOT 0.5 09/14/2018 0430   GFRNONAA >60 09/14/2018 0430   GFRAA >60  09/14/2018 0430   Lab Results  Component Value Date   WBC 7.0 06/19/2019   HGB 11.3 (L) 06/19/2019   HCT 36.8 06/19/2019   MCV 97.1 06/19/2019   PLT 246 06/19/2019    Imaging Studies: No results found.  Assessment and Plan:   Lamiracle Owens is a 73 y.o. 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 Darla Lesches in 1 month to assess that surgery referral is needed depending on her symptoms at that time  Due to her reflux symptoms multiple times a week, will increase Protonix to twice daily dosing for a month  (Risks of PPI use were discussed with patient including bone loss, C. Diff diarrhea, pneumonia, infections, CKD, electrolyte abnormalities.  If clinically possible based on symptoms, goal would be to maintain patient on the lowest dose possible, or discontinue the medication with institution of acid reflux lifestyle modifications over time. Pt. Verbalizes understanding and chooses to continue the medication.)      Dr [name]

## 2019-07-17 ENCOUNTER — Inpatient Hospital Stay: Payer: Medicare Other | Attending: Hematology and Oncology

## 2019-07-17 VITALS — BP 95/58 | HR 80 | Temp 98.3°F | Resp 18

## 2019-07-17 DIAGNOSIS — E538 Deficiency of other specified B group vitamins: Secondary | ICD-10-CM | POA: Diagnosis not present

## 2019-07-17 DIAGNOSIS — D500 Iron deficiency anemia secondary to blood loss (chronic): Secondary | ICD-10-CM

## 2019-07-17 MED ORDER — CYANOCOBALAMIN 1000 MCG/ML IJ SOLN
1000.0000 ug | Freq: Once | INTRAMUSCULAR | Status: AC
  Administered 2019-07-17: 1000 ug via INTRAMUSCULAR

## 2019-08-13 ENCOUNTER — Other Ambulatory Visit: Payer: Self-pay

## 2019-08-13 MED ORDER — PANTOPRAZOLE SODIUM 40 MG PO TBEC: 40.0000 mg | DELAYED_RELEASE_TABLET | Freq: Two times a day (BID) | ORAL | 0 refills | Status: DC

## 2019-08-13 NOTE — Telephone Encounter (Signed)
Last office visit 07/16/2019 GERD  Last refill 07/16/2019 0 refills  No appointment is scheduled

## 2019-08-14 ENCOUNTER — Inpatient Hospital Stay: Payer: Medicare Other | Attending: Hematology and Oncology

## 2019-08-14 VITALS — BP 102/68 | HR 79 | Temp 98.5°F

## 2019-08-14 DIAGNOSIS — E538 Deficiency of other specified B group vitamins: Secondary | ICD-10-CM | POA: Insufficient documentation

## 2019-08-14 DIAGNOSIS — D500 Iron deficiency anemia secondary to blood loss (chronic): Secondary | ICD-10-CM

## 2019-08-14 MED ORDER — CYANOCOBALAMIN 1000 MCG/ML IJ SOLN
1000.0000 ug | Freq: Once | INTRAMUSCULAR | Status: AC
  Administered 2019-08-14: 1000 ug via INTRAMUSCULAR

## 2019-09-09 ENCOUNTER — Other Ambulatory Visit: Payer: Self-pay

## 2019-09-09 NOTE — Progress Notes (Signed)
[facility]  [address], [address] [place], [place] [address] Phone: [phone]  Fax: [phone]   Clinic Day:  09/11/2019  Referring physician: [name], MD  Chief Complaint: Jocelyn Case is a 73 y.o. 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   HPI: The patient was last seen in the hematology clinic on 03/27/2019. At that time,  she was doing well. Energy level had improved. She denied pica. Exam was unremarkable. Hemoglobin 10.4. MCV 92.5. Ferritin 11.    She received weekly Venofer x 2 (03/27/2019 - 04/03/2019).  She received Venofer on 06/24/2019.  Patient received monthly B-12 (03/27/2019 - 08/14/2019).   She was seen in gastroenterology by Dr. Irena Reichmann on 04/07/2019. She was doing well. She had no abdominal pain. Her nausea and vomiting improved. She maintained a good diet. She gained 4 pounds. She had reflux. Cimetidine was discontinued. Colestipol was held because it may have been contributing to decreased efficacy of her PPI.   She had an EGD by Dr Cephas Darby on 05/26/2019.  The AXIOS stent was removed.  Labs followed: 06/19/2019: Hematocrit 36.8, hemoglobin 11.3, MCV 97.1, platelets 246,000, WBC 7,000. Ferritin 32.  09/10/2019: Hematocrit 36.7, hemoglobin 11.2, MCV 99.5, platelets 232,000, WBC 4,400. Ferritin 43.   During the interim, she has felt "ok". She notes an allergy flare up. Her energy is better. She continues to have heartburn. She had nausea and vomiting secondary to Thanksgiving meal. She has nausea depending on certain foods she eats.   She will be meeting with surgeon Dr. Dawna Part on 09/17/2019 because Dr. Cephas Darby noted there was nothing else he could do about her stent.   Past Medical History:  Diagnosis Date  . Anemia   . Blood transfusion without reported diagnosis   . Complication of anesthesia   . GERD (gastroesophageal reflux disease)   .  PONV (postoperative nausea and vomiting)   . Restless leg syndrome 2015   R leg   . Sleep apnea     Past Surgical History:  Procedure Laterality Date  . ABDOMINAL HYSTERECTOMY    . APPENDECTOMY    . CHOLECYSTECTOMY    . COLONOSCOPY  2 years ago  . ESOPHAGOGASTRODUODENOSCOPY (EGD) WITH PROPOFOL N/A 02/14/2018   Procedure: ESOPHAGOGASTRODUODENOSCOPY (EGD) WITH PROPOFOL;  Surgeon: Virgel Manifold, MD;  Location: [facility] END[facility];  Service: Endoscopy;  Laterality: N/A;  . ESOPHAGOGASTRODUODENOSCOPY (EGD) WITH PROPOFOL N/A 02/15/2018   Procedure: ESOPHAGOGASTRODUODENOSCOPY (EGD) WITH PROPOFOL;  Surgeon: Virgel Manifold, MD;  Location: [facility] ENDOSCOPY;  Service: Endoscopy;  Laterality: N/A;  . ESOPHAGOGASTRODUODENOSCOPY (EGD) WITH PROPOFOL N/A 07/05/2018   Procedure: ESOPHAGOGASTRODUODENOSCOPY (EGD) WITH PROPOFOL;  Surgeon: Virgel Manifold, MD;  Location: [facility] ENDOSCOPY;  Service: Endoscopy;  Laterality: N/A;  . STOMACH SURGERY  2013  . UPPER GASTROINTESTINAL ENDOSCOPY  05/2017   pt stated "bleeding ulcer and stopped up duodenum was noted"    Family History  Problem Relation Age of Onset  . Peptic Ulcer Disease Mother   . Dementia Mother   . COPD Father   . Colon cancer Father     Social History:  reports that she has never smoked. She has never used smokeless tobacco. She reports current alcohol use. She reports that she does not use drugs. She drinks wine in the evening. She previously lived in [place]. She moved to [place] in 05/2017. Her daughter lives in [place]. She is temporarily living in an  apartment in [place]. She has a Therapist, occupational. The patient is alone today.  Allergies:  Allergies  Allergen Reactions  . Morphine Nausea And Vomiting    Current Medications: Current Outpatient Medications  Medication Sig Dispense Refill  . Cholecalciferol (VITAMIN D3) 50 MCG (2000 UT) TABS Take 2,000 Units by mouth daily.    . cimetidine (TAGAMET) 400 MG  tablet Take 400 mg by mouth 2 (two) times daily.     . colestipol (COLESTID) 1 g tablet Take 1 g by mouth.     . cyanocobalamin (,VITAMIN B-12,) 1000 MCG/ML injection Inject 1,000 mcg into the muscle every 30 (thirty) days.    . famotidine (PEPCID) 20 MG tablet Take by mouth.    . folic acid (FOLVITE) A999333 MCG tablet Take 400 mcg by mouth daily.     . mirtazapine (REMERON) 15 MG tablet Take 15 mg by mouth at bedtime.     Marland Kitchen oxybutynin (DITROPAN-XL) 10 MG 24 hr tablet Take 10 mg by mouth at bedtime.   11  . pantoprazole (PROTONIX) 40 MG tablet Take 1 tablet (40 mg total) by mouth 2 (two) times daily. 60 tablet 0  . rOPINIRole (REQUIP) 0.25 MG tablet Take 0.25 mg by mouth at bedtime.     . sucralfate (CARAFATE) 1 g tablet Take 1 tablet (1 g total) by mouth 4 (four) times daily. 360 tablet 1  . pantoprazole (PROTONIX) 40 MG tablet Take 1 tablet (40 mg total) by mouth daily. (Patient not taking: Reported on 09/10/2019) 90 tablet 0   No current facility-administered medications for this visit.    Review of Systems  Constitutional: Negative for chills, diaphoresis, fever, malaise/fatigue (energy level improved) and weight loss (up 2 pounds).       Feels "ok".  HENT: Positive for hearing loss (hearing aides). Negative for congestion, ear pain, nosebleeds, sinus pain and sore throat.   Eyes: Negative.  Negative for blurred vision, photophobia and pain.  Respiratory: Negative.  Negative for cough, hemoptysis, sputum production and shortness of breath.   Cardiovascular: Negative.  Negative for chest pain, palpitations, orthopnea, leg swelling and PND.  Gastrointestinal: Positive for heartburn. Negative for abdominal pain, blood in stool, constipation, diarrhea, melena, nausea (chronic; depends on food intake; after Thanksgiving meal) and vomiting (after Thanksgiving meal).       S/p duodenal stent placement and removal.  Genitourinary: Negative.  Negative for dysuria, frequency, hematuria and urgency.   Musculoskeletal: Negative.  Negative for back pain, falls, joint pain, myalgias and neck pain.  Skin: Negative.  Negative for itching and rash.  Neurological: Positive for tremors (chronic restless legs). Negative for dizziness, sensory change, speech change, focal weakness, weakness and headaches.  Endo/Heme/Allergies: Positive for environmental allergies. Does not bruise/bleed easily.  Psychiatric/Behavioral: Negative.  Negative for depression and memory loss. The patient is not nervous/anxious and does not have insomnia.   All other systems reviewed and are negative.  Performance status (ECOG): 1  Vitals Blood pressure (!) 110/53, pulse 71, temperature 98.2 F (36.8 C), temperature source Tympanic, weight 120 lb 5.9 oz (54.6 kg), SpO2 99 %.   Physical Exam  Constitutional: She is oriented to person, place, and time. She appears well-developed and well-nourished. No distress.  HENT:  Head: Normocephalic and atraumatic.  Mouth/Throat: Oropharynx is clear and moist. No oropharyngeal exudate.  Shot styled blonde hair.  Mask.  Eyes: Pupils are equal, round, and reactive to light. Conjunctivae and EOM are normal. No scleral icterus.  Glasses. Blue eyes.  Neck: Normal  range of motion. Neck supple. No JVD present.  Cardiovascular: Normal rate, regular rhythm and normal heart sounds.  No murmur heard. Pulmonary/Chest: Effort normal and breath sounds normal. No respiratory distress. She has no wheezes. She has no rales.  Abdominal: Soft. Bowel sounds are normal. She exhibits no distension and no mass. There is no abdominal tenderness. There is no rebound and no guarding.  Musculoskeletal:        General: No edema. Normal range of motion.     Cervical back: Normal range of motion and neck supple.  Lymphadenopathy:       Head (right side): No preauricular, no posterior auricular and no occipital adenopathy present.       Head (left side): No preauricular, no posterior auricular and no  occipital adenopathy present.    She has no cervical adenopathy.    She has no axillary adenopathy.       Right: No inguinal and no supraclavicular adenopathy present.       Left: No inguinal and no supraclavicular adenopathy present.  Neurological: She is alert and oriented to person, place, and time.  Skin: Skin is warm and dry. No rash noted. She is not diaphoretic. No erythema. No pallor.  Psychiatric: She has a normal mood and affect. Her behavior is normal. Judgment and thought content normal.  Nursing note and vitals reviewed.   Appointment on 09/10/2019  Component Date Value Ref Range Status  . Ferritin 09/10/2019 43  11 - 307 ng/mL Final   Performed at [facility], [address] [place], [place] [address]  . WBC 09/10/2019 4.4  4.0 - 10.5 K/uL Final  . RBC 09/10/2019 3.69* 3.87 - 5.11 MIL/uL Final  . Hemoglobin 09/10/2019 11.2* 12.0 - 15.0 g/dL Final  . HCT 09/10/2019 36.7  36.0 - 46.0 % Final  . MCV 09/10/2019 99.5  80.0 - 100.0 fL Final  . MCH 09/10/2019 30.4  26.0 - 34.0 pg Final  . MCHC 09/10/2019 30.5  30.0 - 36.0 g/dL Final  . RDW 09/10/2019 14.6  11.5 - 15.5 % Final  . Platelets 09/10/2019 232  150 - 400 K/uL Final  . nRBC 09/10/2019 0.0  0.0 - 0.2 % Final  . Neutrophils Relative % 09/10/2019 62  % Final  . Neutro Abs 09/10/2019 2.7  1.7 - 7.7 K/uL Final  . Lymphocytes Relative 09/10/2019 27  % Final  . Lymphs Abs 09/10/2019 1.2  0.7 - 4.0 K/uL Final  . Monocytes Relative 09/10/2019 7  % Final  . Monocytes Absolute 09/10/2019 0.3  0.1 - 1.0 K/uL Final  . Eosinophils Relative 09/10/2019 3  % Final  . Eosinophils Absolute 09/10/2019 0.2  0.0 - 0.5 K/uL Final  . Basophils Relative 09/10/2019 1  % Final  . Basophils Absolute 09/10/2019 0.0  0.0 - 0.1 K/uL Final  . Immature Granulocytes 09/10/2019 0  % Final  . Abs Immature Granulocytes 09/10/2019 0.01  0.00 - 0.07 K/uL Final   Performed at [facility], [address], [place], [place]  [address]    Assessment:  Jocelyn Case is a 73 y.o. female with s/ppartialgastrectomyin 2009 and subsequent iron deficiency anemia. She previously received IV iron off/on every 6 months for the past 3 years while living in [place]. She is intolerant of oral iron.  Work-up on 02/22/2019revealed a hematocrit of 31, hemoglobin 9.6, MCV 84.7, platelets 379,000, WBC 5600 with an ANC of 3200. Ferritin was 7. B12 was 267. Folate was 28.  She receivedVenoferweekly  x 4 (11/30/2017 - 12/21/2017), x 2 (04/16/2018 - 04/23/2018), x 2 (03/27/2019 - 04/03/2019), and 06/24/2019.  Ferritinhas been followed: 5 on 11/05/2017, 7 on 11/23/2017, 71 on 01/04/2018, 45 on 02/15/2018, 12 on 04/08/2018, 130 on 05/21/2018, 132 on 07/10/2018, 54 on 10/17/2018, 44 on 10/31/2018, 11 on 03/26/2019, 32 on 06/19/2019, and 43 on 09/10/2019.   She receives B12IM monthly (last 08/14/2019). She is on folic acid. Folate was 60on 10/31/2018.  EGDon 02/15/2018 revealed non-bleeding gastric ulcers at the anastomosis, with no stigmata of bleeding. Biopsied. A Billroth II anastomosis characterized by ulceration and mild stenosis was dilated. Jejunal polyp(s) were biopsied. Pathology revealed no evidence of malignancy. EGDon 07/05/2018 revealed non-bleeding gastric ulcer with no stigmata of bleeding. 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 Duodenal stent was placed on 01/14/2020and removed on 11/29/2018.  EGD on 02/20/2019 revealed gastrointestinal mucosa with chronic active inflammation, marked erosion and reactive epithelial changes.  H pylori and CMV were negative.  EGD on 05/26/2019 removed he AXIOS stent.  Symptomatically, she has heartburn.  She notes intermittent nausea depending on the food.  Plan: 1.   Review labs from 09/10/2019.  2.   Iron deficency anemia Hematocrit 36.7.  Hemoglobin 11.2. MCV 99.5. Ferritin43. No Venofer needed. 3.   B12 deficiency B12  today and monthly x 6.  Folate was 60 on 10/31/2018. Check folateannually. 4.   Weight loss, resolved Weight is up 2 pounds.               Patient status post stent replacement. Continue to monitor. 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  Discuss plan for follow-up with Dr Donnal Moat, surgeon at [facility].  6.   RTC in 8 weeks for labs (CBC with diff, ferritin, folate),  and B12. 7.   RTC in 16 weeks for MD assessment, labs (CBC with diff, ferritin, iron studies- day before) and +/- Venofer.  I discussed the assessment and treatment plan with the patient.  The patient was provided an opportunity to ask questions and all were answered.  The patient agreed with the plan and demonstrated an understanding of the instructions.  The patient was advised to call back if the symptoms worsen or if the condition fails to improve as anticipated.   Lequita Asal, MD, PhD    09/11/2019, 1:36 PM  I, Selena Batten, am acting as scribe for [employer]. Mike Gip, MD, PhD.  I, Melissa C. Mike Gip, MD, have reviewed the above documentation for accuracy and completeness, and I agree with the above.

## 2019-09-09 NOTE — Telephone Encounter (Signed)
Last refill 08/13/2019 0 refills  Last office visit 07/16/2019 GERD

## 2019-09-10 ENCOUNTER — Inpatient Hospital Stay: Payer: Medicare Other | Attending: Hematology and Oncology

## 2019-09-10 ENCOUNTER — Other Ambulatory Visit: Payer: Self-pay

## 2019-09-10 ENCOUNTER — Encounter: Payer: Self-pay | Admitting: Hematology and Oncology

## 2019-09-10 ENCOUNTER — Inpatient Hospital Stay: Payer: Medicare Other

## 2019-09-10 DIAGNOSIS — K315 Obstruction of duodenum: Secondary | ICD-10-CM | POA: Diagnosis not present

## 2019-09-10 DIAGNOSIS — D509 Iron deficiency anemia, unspecified: Secondary | ICD-10-CM | POA: Diagnosis not present

## 2019-09-10 DIAGNOSIS — K219 Gastro-esophageal reflux disease without esophagitis: Secondary | ICD-10-CM | POA: Diagnosis not present

## 2019-09-10 DIAGNOSIS — R12 Heartburn: Secondary | ICD-10-CM | POA: Diagnosis not present

## 2019-09-10 DIAGNOSIS — E538 Deficiency of other specified B group vitamins: Secondary | ICD-10-CM | POA: Diagnosis not present

## 2019-09-10 DIAGNOSIS — D500 Iron deficiency anemia secondary to blood loss (chronic): Secondary | ICD-10-CM

## 2019-09-10 LAB — CBC WITH DIFFERENTIAL/PLATELET
Abs Immature Granulocytes: 0.01 10*3/uL (ref 0.00–0.07)
Basophils Absolute: 0 10*3/uL (ref 0.0–0.1)
Basophils Relative: 1 %
Eosinophils Absolute: 0.2 10*3/uL (ref 0.0–0.5)
Eosinophils Relative: 3 %
HCT: 36.7 % (ref 36.0–46.0)
Hemoglobin: 11.2 g/dL — ABNORMAL LOW (ref 12.0–15.0)
Immature Granulocytes: 0 %
Lymphocytes Relative: 27 %
Lymphs Abs: 1.2 10*3/uL (ref 0.7–4.0)
MCH: 30.4 pg (ref 26.0–34.0)
MCHC: 30.5 g/dL (ref 30.0–36.0)
MCV: 99.5 fL (ref 80.0–100.0)
Monocytes Absolute: 0.3 10*3/uL (ref 0.1–1.0)
Monocytes Relative: 7 %
Neutro Abs: 2.7 10*3/uL (ref 1.7–7.7)
Neutrophils Relative %: 62 %
Platelets: 232 10*3/uL (ref 150–400)
RBC: 3.69 MIL/uL — ABNORMAL LOW (ref 3.87–5.11)
RDW: 14.6 % (ref 11.5–15.5)
WBC: 4.4 10*3/uL (ref 4.0–10.5)
nRBC: 0 % (ref 0.0–0.2)

## 2019-09-10 LAB — FERRITIN: Ferritin: 43 ng/mL (ref 11–307)

## 2019-09-10 MED ORDER — PANTOPRAZOLE SODIUM 40 MG PO TBEC: 40.0000 mg | DELAYED_RELEASE_TABLET | Freq: Two times a day (BID) | ORAL | 0 refills | Status: DC

## 2019-09-11 ENCOUNTER — Encounter: Payer: Self-pay | Admitting: Hematology and Oncology

## 2019-09-11 ENCOUNTER — Inpatient Hospital Stay (HOSPITAL_BASED_OUTPATIENT_CLINIC_OR_DEPARTMENT_OTHER): Payer: Medicare Other | Admitting: Hematology and Oncology

## 2019-09-11 ENCOUNTER — Other Ambulatory Visit: Payer: Self-pay

## 2019-09-11 ENCOUNTER — Inpatient Hospital Stay: Payer: Medicare Other

## 2019-09-11 VITALS — BP 110/53 | HR 71 | Temp 98.2°F | Wt 120.4 lb

## 2019-09-11 DIAGNOSIS — D500 Iron deficiency anemia secondary to blood loss (chronic): Secondary | ICD-10-CM | POA: Diagnosis not present

## 2019-09-11 DIAGNOSIS — E538 Deficiency of other specified B group vitamins: Secondary | ICD-10-CM

## 2019-09-11 MED ORDER — CYANOCOBALAMIN 1000 MCG/ML IJ SOLN
1000.0000 ug | Freq: Once | INTRAMUSCULAR | Status: AC
  Administered 2019-09-11: 1000 ug via INTRAMUSCULAR

## 2019-09-11 NOTE — Progress Notes (Signed)
No new changes noted today. The patient Name and DOB has been verified by phone. 

## 2019-10-07 ENCOUNTER — Other Ambulatory Visit: Payer: Self-pay | Admitting: Gastroenterology

## 2019-10-07 ENCOUNTER — Other Ambulatory Visit: Payer: Self-pay

## 2019-10-07 MED ORDER — SUCRALFATE 1 G PO TABS: 1.0000 g | ORAL_TABLET | Freq: Four times a day (QID) | ORAL | 1 refills | Status: DC

## 2019-10-07 MED ORDER — PANTOPRAZOLE SODIUM 40 MG PO TBEC: 40.0000 mg | DELAYED_RELEASE_TABLET | Freq: Every day | ORAL | 0 refills | Status: DC

## 2019-10-07 NOTE — Telephone Encounter (Signed)
Last office visit 07/16/2019 GERD  Last refill 09/10/2019 60 0 refills  Carafate 01/01/2019 360 1 refills Patient would like 90 days sent to mail order   No appointment is scheduled

## 2019-10-10 ENCOUNTER — Other Ambulatory Visit: Payer: Self-pay

## 2019-10-13 ENCOUNTER — Other Ambulatory Visit: Payer: Self-pay

## 2019-10-13 ENCOUNTER — Inpatient Hospital Stay: Payer: Medicare Other | Attending: Hematology and Oncology

## 2019-10-13 VITALS — BP 119/57 | HR 87 | Temp 99.0°F | Resp 18

## 2019-10-13 DIAGNOSIS — D500 Iron deficiency anemia secondary to blood loss (chronic): Secondary | ICD-10-CM

## 2019-10-13 DIAGNOSIS — E538 Deficiency of other specified B group vitamins: Secondary | ICD-10-CM | POA: Insufficient documentation

## 2019-10-13 MED ORDER — CYANOCOBALAMIN 1000 MCG/ML IJ SOLN
1000.0000 ug | Freq: Once | INTRAMUSCULAR | Status: AC
  Administered 2019-10-13: 14:00:00 1000 ug via INTRAMUSCULAR

## 2019-11-06 ENCOUNTER — Other Ambulatory Visit: Payer: Self-pay

## 2019-11-07 ENCOUNTER — Inpatient Hospital Stay: Payer: Medicare Other | Attending: Hematology and Oncology

## 2019-11-07 ENCOUNTER — Inpatient Hospital Stay: Payer: Medicare Other

## 2019-11-07 DIAGNOSIS — D509 Iron deficiency anemia, unspecified: Secondary | ICD-10-CM | POA: Diagnosis not present

## 2019-11-07 DIAGNOSIS — D500 Iron deficiency anemia secondary to blood loss (chronic): Secondary | ICD-10-CM

## 2019-11-07 DIAGNOSIS — E538 Deficiency of other specified B group vitamins: Secondary | ICD-10-CM | POA: Insufficient documentation

## 2019-11-07 LAB — CBC
HCT: 36.2 % (ref 36.0–46.0)
Hemoglobin: 11.4 g/dL — ABNORMAL LOW (ref 12.0–15.0)
MCH: 31.2 pg (ref 26.0–34.0)
MCHC: 31.5 g/dL (ref 30.0–36.0)
MCV: 99.2 fL (ref 80.0–100.0)
Platelets: 254 10*3/uL (ref 150–400)
RBC: 3.65 MIL/uL — ABNORMAL LOW (ref 3.87–5.11)
RDW: 13.9 % (ref 11.5–15.5)
WBC: 4.7 10*3/uL (ref 4.0–10.5)
nRBC: 0 % (ref 0.0–0.2)

## 2019-11-07 MED ORDER — CYANOCOBALAMIN 1000 MCG/ML IJ SOLN
1000.0000 ug | Freq: Once | INTRAMUSCULAR | Status: AC
  Administered 2019-11-07: 14:00:00 1000 ug via INTRAMUSCULAR

## 2019-11-07 NOTE — Patient Instructions (Signed)

## 2019-11-08 LAB — FOLATE: Folate: 34 ng/mL (ref >5.9)

## 2019-11-08 LAB — FERRITIN: Ferritin: 69 ng/mL (ref 11–307)

## 2019-11-10 ENCOUNTER — Inpatient Hospital Stay: Payer: Medicare Other

## 2019-11-10 ENCOUNTER — Other Ambulatory Visit: Payer: Medicare Other

## 2019-12-08 ENCOUNTER — Inpatient Hospital Stay: Payer: Medicare Other | Attending: Hematology and Oncology

## 2019-12-08 ENCOUNTER — Other Ambulatory Visit: Payer: Self-pay

## 2019-12-08 VITALS — BP 107/63 | HR 83 | Temp 98.0°F | Resp 18

## 2019-12-08 DIAGNOSIS — E538 Deficiency of other specified B group vitamins: Secondary | ICD-10-CM | POA: Insufficient documentation

## 2019-12-08 DIAGNOSIS — D500 Iron deficiency anemia secondary to blood loss (chronic): Secondary | ICD-10-CM

## 2019-12-08 MED ORDER — CYANOCOBALAMIN 1000 MCG/ML IJ SOLN
1000.0000 ug | Freq: Once | INTRAMUSCULAR | Status: AC
  Administered 2019-12-08: 1000 ug via INTRAMUSCULAR

## 2019-12-08 NOTE — Patient Instructions (Signed)

## 2020-01-08 NOTE — Progress Notes (Signed)
[facility]  [address], [address] [place], [place] [address] Phone: [phone]  Fax: [phone]   Clinic Day:  01/12/2020  Referring physician: [name], MD  Chief Complaint: Jocelyn Case is a 74 y.o. female with s/p partial gastrectomywith B12 deficiency andiron deficiency who is seen for a 4 month assessment.   HPI: The patient was last seen in the hematology clinic on 09/11/2019. At that time, she had heartburn. She noted intermittent nausea depending on the food she ate. Hematocrit was 36.7, hemoglobin 11.2, MCV 99.5, platelet 232,000, WBC 4,400. Ferritin was 43. The patient did not require IV iron. Patient received a B12 injection. We discussed a follow up with Dr. 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    She has received a monthly B12 injection x 3 (10/13/2019 - 12/08/2019).   Labs followed: 11/07/2019: Hematocrit 36.2, hemoglobin 11.4, MCV 99.2, platelets 254,000, WBC 4,700. Ferritin was 69. Folate was 34.0. 01/09/2020: Hematocrit 35.7, hemoglobin 11.0, MCV 99.4, platelets 221,000, WBC 7,600. Ferritin was 52 with an iron saturation of 7% and a TIBC of 336.   During the interim, the patient has been doing ok. Patient asked Dr. Donnal Moat to push surgery to end of this summer because she wants to attend a wedding in 01/2020. She has some nausea, vomiting and reflux. She is eating a bland diet. She does not eat salads. She craves carbs. She has no bleeding of any kind. She reports restless legs. No ice pica.  Patient agreed to IV iron today.  Patient received the [place] COVID-19 vaccine. Her last shot was on 11/15/2019. She experienced a sore arm x 1 day.    Past Medical History:  Diagnosis Date  . Anemia   . Blood transfusion without reported diagnosis   . Complication of anesthesia   . GERD (gastroesophageal reflux disease)   . PONV (postoperative nausea and vomiting)   . Restless leg syndrome 2015   R leg   . Sleep  apnea     Past Surgical History:  Procedure Laterality Date  . ABDOMINAL HYSTERECTOMY    . APPENDECTOMY    . CHOLECYSTECTOMY    . COLONOSCOPY  2 years ago  . ESOPHAGOGASTRODUODENOSCOPY (EGD) WITH PROPOFOL N/A 02/14/2018   Procedure: ESOPHAGOGASTRODUODENOSCOPY (EGD) WITH PROPOFOL;  Surgeon: Virgel Manifold, MD;  Location: [facility] ENDOSCOPY;  Service: Endoscopy;  Laterality: N/A;  . ESOPHAGOGASTRODUODENOSCOPY (EGD) WITH PROPOFOL N/A 02/15/2018   Procedure: ESOPHAGOGASTRODUODENOSCOPY (EGD) WITH PROPOFOL;  Surgeon: Virgel Manifold, MD;  Location: [facility] ENDOSCOPY;  Service: Endoscopy;  Laterality: N/A;  . ESOPHAGOGASTRODUODENOSCOPY (EGD) WITH PROPOFOL N/A 07/05/2018   Procedure: ESOPHAGOGASTRODUODENOSCOPY (EGD) WITH PROPOFOL;  Surgeon: Virgel Manifold, MD;  Location: [facility] ENDOSCOPY;  Service: Endoscopy;  Laterality: N/A;  . STOMACH SURGERY  2013  . UPPER GASTROINTESTINAL ENDOSCOPY  05/2017   pt stated "bleeding ulcer and stopped up duodenum was noted"    Family History  Problem Relation Age of Onset  . Peptic Ulcer Disease Mother   . Dementia Mother   . COPD Father   . Colon cancer Father     Social History:  reports that she has never smoked. She has never used smokeless tobacco. She reports current alcohol use. She reports that she does not use drugs.  She drinks wine in the evening. She previously lived in [place]. She moved to [place] in 05/2017. Her daughter lives in [place]. She is temporarily living in an apartment in [place].She has a Therapist, occupational.The patient is  alone today.  Allergies:  Allergies  Allergen Reactions  . Morphine Nausea And Vomiting    Current Medications: Current Outpatient Medications  Medication Sig Dispense Refill  . Cholecalciferol (VITAMIN D3) 50 MCG (2000 UT) TABS Take 2,000 Units by mouth daily.    . colestipol (COLESTID) 1 g tablet Take 1 g by mouth.     . cyanocobalamin (,VITAMIN B-12,) 1000 MCG/ML injection Inject  1,000 mcg into the muscle every 30 (thirty) days.    . folic acid (FOLVITE) A999333 MCG tablet Take 400 mcg by mouth daily.     Marland Kitchen ibandronate (BONIVA) 150 MG tablet Take 150 mg by mouth every 30 (thirty) days.     . mirtazapine (REMERON) 15 MG tablet Take 15 mg by mouth at bedtime.     Marland Kitchen oxybutynin (DITROPAN-XL) 10 MG 24 hr tablet Take 10 mg by mouth at bedtime.   11  . pantoprazole (PROTONIX) 40 MG tablet Take 1 tablet (40 mg total) by mouth daily. 90 tablet 0  . rOPINIRole (REQUIP) 0.25 MG tablet Take 0.25 mg by mouth at bedtime.     . sucralfate (CARAFATE) 1 g tablet Take 1 tablet (1 g total) by mouth 4 (four) times daily. 360 tablet 1   No current facility-administered medications for this visit.    Review of Systems  Constitutional: Negative for chills, diaphoresis, fever, malaise/fatigue and weight loss (stable).       Doing ok.  HENT: Positive for hearing loss (hearing aides). Negative for congestion, ear pain, nosebleeds, sinus pain and sore throat.   Eyes: Negative.  Negative for blurred vision, photophobia and pain.  Respiratory: Negative.  Negative for cough, hemoptysis, sputum production and shortness of breath.   Cardiovascular: Negative.  Negative for chest pain, palpitations, orthopnea, leg swelling and PND.  Gastrointestinal: Positive for heartburn (reflux) and nausea (chronic; depends on food intake). Negative for abdominal pain, blood in stool, constipation, diarrhea, melena and vomiting.       S/p duodenal stent placement and removal.  Genitourinary: Negative.  Negative for dysuria, frequency, hematuria and urgency.  Musculoskeletal: Negative.  Negative for back pain, falls, joint pain, myalgias and neck pain.  Skin: Negative.  Negative for itching and rash.  Neurological: Positive for tremors (chronic restless legs). Negative for dizziness, sensory change, speech change, focal weakness, weakness and headaches.  Endo/Heme/Allergies: Negative for environmental allergies. Does  not bruise/bleed easily.  Psychiatric/Behavioral: Negative.  Negative for depression and memory loss. The patient is not nervous/anxious and does not have insomnia.   All other systems reviewed and are negative.  Performance status (ECOG): 1  Vitals Blood pressure (!) 109/51, pulse 73, temperature 98.2 F (36.8 C), temperature source Tympanic, resp. rate 16, weight 121 lb 2.3 oz (54.9 kg), SpO2 100 %.   Physical Exam  Constitutional: She is oriented to person, place, and time. She appears well-developed and well-nourished. No distress.  HENT:  Head: Normocephalic and atraumatic.  Mouth/Throat: Oropharynx is clear and moist. No oropharyngeal exudate.  Shot styled blonde hair.  Mask.  Eyes: Pupils are equal, round, and reactive to light. Conjunctivae and EOM are normal. No scleral icterus.  Glasses. Blue eyes.  Neck: No JVD present.  Cardiovascular: Normal rate, regular rhythm and normal heart sounds.  No murmur heard. Pulmonary/Chest: Effort normal and breath sounds normal. No respiratory distress. She has no wheezes. She has no rales.  Abdominal: Soft. Bowel sounds are normal. She exhibits no distension and no mass. There is no abdominal tenderness. There is no rebound  and no guarding.  Musculoskeletal:        General: No edema. Normal range of motion.     Cervical back: Normal range of motion and neck supple.  Lymphadenopathy:       Head (right side): No preauricular, no posterior auricular and no occipital adenopathy present.       Head (left side): No preauricular, no posterior auricular and no occipital adenopathy present.    She has no cervical adenopathy.    She has no axillary adenopathy.       Right: No supraclavicular adenopathy present.       Left: No supraclavicular adenopathy present.  Neurological: She is alert and oriented to person, place, and time.  Skin: Skin is warm and dry. No rash noted. She is not diaphoretic. No erythema. No pallor.  Psychiatric: She has a  normal mood and affect. Her behavior is normal. Judgment and thought content normal.  Nursing note and vitals reviewed.   No visits with results within 3 Day(s) from this visit.  Latest known visit with results is:  Appointment on 01/09/2020  Component Date Value Ref Range Status  . WBC 01/09/2020 7.6  4.0 - 10.5 K/uL Final  . RBC 01/09/2020 3.59* 3.87 - 5.11 MIL/uL Final  . Hemoglobin 01/09/2020 11.0* 12.0 - 15.0 g/dL Final  . HCT 01/09/2020 35.7* 36.0 - 46.0 % Final  . MCV 01/09/2020 99.4  80.0 - 100.0 fL Final  . MCH 01/09/2020 30.6  26.0 - 34.0 pg Final  . MCHC 01/09/2020 30.8  30.0 - 36.0 g/dL Final  . RDW 01/09/2020 13.4  11.5 - 15.5 % Final  . Platelets 01/09/2020 221  150 - 400 K/uL Final  . nRBC 01/09/2020 0.0  0.0 - 0.2 % Final   Performed at [facility], [address] [place], [place] [address]  . Iron 01/09/2020 25* 28 - 170 ug/dL Final  . TIBC 01/09/2020 336  250 - 450 ug/dL Final  . Saturation Ratios 01/09/2020 7* 10.4 - 31.8 % Final  . UIBC 01/09/2020 311  ug/dL Final   Performed at [facility], [address] [place], [place] [address]  . Ferritin 01/09/2020 52  11 - 307 ng/mL Final   Performed at [facility], [address] [place], [place] [address]    Assessment:  Jocelyn Case is a 74 y.o. female with s/ppartialgastrectomyin 2009 and subsequent iron deficiency anemia. She previously received IV iron off/on every 6 months for the past 3 years while living in [place]. She is intolerant of oral iron.  Work-up on 02/22/2019revealed a hematocrit of 31, hemoglobin 9.6, MCV 84.7, platelets 379,000, WBC 5600 with an ANC of 3200. Ferritin was 7. B12 was 267. Folate was 28.  She receivedVenoferweekly x 4 (11/30/2017 - 12/21/2017), x 2 (04/16/2018 - 04/23/2018), x 2 (03/27/2019 - 04/03/2019), and 06/24/2019.  Ferritinhas been followed: 5 on 11/05/2017, 7 on 11/23/2017, 71 on 01/04/2018, 45 on 02/15/2018, 12 on 04/08/2018,  130 on 05/21/2018, 132 on 07/10/2018, 54 on 10/17/2018,44 on 10/31/2018, 11 on 03/26/2019, 32 on 06/19/2019, 43 on 09/10/2019, 69 on 11/07/2019 and 52 on 01/09/2020.  She receives B12IM monthly (last  12/08/2019). She is on folic acid. Folate was 60on 10/31/2018.  EGDon 02/15/2018 revealed non-bleeding gastric ulcers at the anastomosis, with no stigmata of bleeding. Biopsied. A Billroth II anastomosis characterized by ulceration and mild stenosis was dilated. Jejunal polyp(s) were biopsied. Pathology revealed no evidence of malignancy. EGDon 07/05/2018 revealed non-bleeding gastric ulcer with no  stigmata of bleeding. 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 Duodenal stent was placed on 01/14/2020and removed on 11/29/2018.EGD on 02/20/2019 revealed gastrointestinal mucosa with chronic active inflammation, marked erosion and reactive epithelial changes.H pylori and CMV were negative.  EGD on 05/26/2019 removed he AXIOS stent.  Patient received the [place] COVID-19 vaccine. Her last shot was on 11/15/2019.   Symptomatically, she feels okay.  She has restless legs.  She denies any pica.  Exam is normal.  Plan: 1.   Review labs from 01/09/2020. 2.Iron deficency anemia Hematocrit 35.7.  Hemoglobin 11.0. MCV 99.4. Ferritin52 with an iron saturation of 7% and a TIBC 336. Venofer today and weekly x1 (total 2). 3.B12 deficiency B12 today and monthly x6.  Folate was 34 on 11/07/2019. Check folate annually. 4.Weight loss, resolved Weight stable.  Patient s/p stent replacement. Continue to monitor 5. Duodenal stenosis             S/p stent placement and removal.             Follow-up with Dr Donnal Moat, surgeon at [facility].  6.   RTC in 3 months for labs (CBC with diff, ferritin, iron studies, folate). 7.   RTC in 6 months for MD assessment, labs (CBC with diff, ferritin, sed rate-day before) and  +/- Venofer.  I discussed the assessment and treatment plan with the patient.  The patient was provided an opportunity to ask questions and all were answered.  The patient agreed with the plan and demonstrated an understanding of the instructions.  The patient was advised to call back if the symptoms worsen or if the condition fails to improve as anticipated.   Lequita Asal, MD, PhD    01/12/2020, 1:24 PM  I, Selena Batten, am acting as scribe for [employer]. Mike Gip, MD, PhD.  I, Tangy Drozdowski C. Mike Gip, MD, have reviewed the above documentation for accuracy and completeness, and I agree with the above.

## 2020-01-09 ENCOUNTER — Inpatient Hospital Stay: Payer: Medicare Other | Attending: Hematology and Oncology

## 2020-01-09 ENCOUNTER — Other Ambulatory Visit: Payer: Self-pay

## 2020-01-09 DIAGNOSIS — E538 Deficiency of other specified B group vitamins: Secondary | ICD-10-CM | POA: Diagnosis present

## 2020-01-09 DIAGNOSIS — D509 Iron deficiency anemia, unspecified: Secondary | ICD-10-CM | POA: Insufficient documentation

## 2020-01-09 DIAGNOSIS — D500 Iron deficiency anemia secondary to blood loss (chronic): Secondary | ICD-10-CM

## 2020-01-09 LAB — CBC
HCT: 35.7 % — ABNORMAL LOW (ref 36.0–46.0)
Hemoglobin: 11 g/dL — ABNORMAL LOW (ref 12.0–15.0)
MCH: 30.6 pg (ref 26.0–34.0)
MCHC: 30.8 g/dL (ref 30.0–36.0)
MCV: 99.4 fL (ref 80.0–100.0)
Platelets: 221 10*3/uL (ref 150–400)
RBC: 3.59 MIL/uL — ABNORMAL LOW (ref 3.87–5.11)
RDW: 13.4 % (ref 11.5–15.5)
WBC: 7.6 10*3/uL (ref 4.0–10.5)
nRBC: 0 % (ref 0.0–0.2)

## 2020-01-10 LAB — FERRITIN: Ferritin: 52 ng/mL (ref 11–307)

## 2020-01-10 LAB — IRON AND TIBC
Iron: 25 ug/dL — ABNORMAL LOW (ref 28–170)
Saturation Ratios: 7 % — ABNORMAL LOW (ref 10.4–31.8)
TIBC: 336 ug/dL (ref 250–450)
UIBC: 311 ug/dL

## 2020-01-12 ENCOUNTER — Inpatient Hospital Stay (HOSPITAL_BASED_OUTPATIENT_CLINIC_OR_DEPARTMENT_OTHER): Payer: Medicare Other | Admitting: Hematology and Oncology

## 2020-01-12 ENCOUNTER — Encounter: Payer: Self-pay | Admitting: Hematology and Oncology

## 2020-01-12 ENCOUNTER — Inpatient Hospital Stay: Payer: Medicare Other

## 2020-01-12 ENCOUNTER — Other Ambulatory Visit: Payer: Self-pay

## 2020-01-12 VITALS — BP 113/73 | HR 76 | Temp 98.0°F | Resp 17

## 2020-01-12 VITALS — BP 109/51 | HR 73 | Temp 98.2°F | Resp 16 | Wt 121.1 lb

## 2020-01-12 DIAGNOSIS — D500 Iron deficiency anemia secondary to blood loss (chronic): Secondary | ICD-10-CM | POA: Diagnosis not present

## 2020-01-12 DIAGNOSIS — E538 Deficiency of other specified B group vitamins: Secondary | ICD-10-CM | POA: Diagnosis not present

## 2020-01-12 DIAGNOSIS — D509 Iron deficiency anemia, unspecified: Secondary | ICD-10-CM | POA: Diagnosis not present

## 2020-01-12 MED ORDER — SODIUM CHLORIDE 0.9 % IV SOLN: 200.0000 mg | Freq: Once | INTRAVENOUS | Status: DC

## 2020-01-12 MED ORDER — SODIUM CHLORIDE 0.9 % IV SOLN
Freq: Once | INTRAVENOUS | Status: AC
  Filled 2020-01-12: qty 250

## 2020-01-12 MED ORDER — IRON SUCROSE 20 MG/ML IV SOLN
200.0000 mg | Freq: Once | INTRAVENOUS | Status: AC
  Administered 2020-01-12: 200 mg via INTRAVENOUS

## 2020-01-12 MED ORDER — CYANOCOBALAMIN 1000 MCG/ML IJ SOLN
1000.0000 ug | Freq: Once | INTRAMUSCULAR | Status: AC
  Administered 2020-01-12: 1000 ug via INTRAMUSCULAR

## 2020-01-12 NOTE — Progress Notes (Signed)
Patient here for follow up. Denies any concerns.  

## 2020-01-12 NOTE — Patient Instructions (Signed)

## 2020-01-19 ENCOUNTER — Other Ambulatory Visit: Payer: Self-pay

## 2020-01-19 ENCOUNTER — Inpatient Hospital Stay: Payer: Medicare Other

## 2020-01-19 VITALS — BP 100/63 | HR 78 | Temp 97.8°F | Resp 18

## 2020-01-19 DIAGNOSIS — D500 Iron deficiency anemia secondary to blood loss (chronic): Secondary | ICD-10-CM

## 2020-01-19 DIAGNOSIS — D509 Iron deficiency anemia, unspecified: Secondary | ICD-10-CM | POA: Diagnosis not present

## 2020-01-19 MED ORDER — SODIUM CHLORIDE 0.9 % IV SOLN: 200.0000 mg | Freq: Once | INTRAVENOUS | Status: DC

## 2020-01-19 MED ORDER — IRON SUCROSE 20 MG/ML IV SOLN
200.0000 mg | Freq: Once | INTRAVENOUS | Status: AC
  Administered 2020-01-19: 14:00:00 200 mg via INTRAVENOUS

## 2020-01-19 MED ORDER — SODIUM CHLORIDE 0.9 % IV SOLN
Freq: Once | INTRAVENOUS | Status: AC
  Filled 2020-01-19: qty 250

## 2020-01-19 NOTE — Patient Instructions (Signed)

## 2020-01-20 ENCOUNTER — Other Ambulatory Visit: Payer: Self-pay

## 2020-01-20 MED ORDER — PANTOPRAZOLE SODIUM 40 MG PO TBEC: 40.0000 mg | DELAYED_RELEASE_TABLET | Freq: Every day | ORAL | 0 refills | Status: DC

## 2020-01-20 NOTE — Telephone Encounter (Signed)
Last office visit 07/16/2019 GERD  Last refill 08/15/18 0 refills

## 2020-02-09 ENCOUNTER — Other Ambulatory Visit: Payer: Self-pay

## 2020-02-09 ENCOUNTER — Inpatient Hospital Stay: Payer: Medicare Other | Attending: Hematology and Oncology

## 2020-02-09 VITALS — BP 100/64 | HR 79 | Temp 98.1°F | Resp 18

## 2020-02-09 DIAGNOSIS — D500 Iron deficiency anemia secondary to blood loss (chronic): Secondary | ICD-10-CM

## 2020-02-09 DIAGNOSIS — E538 Deficiency of other specified B group vitamins: Secondary | ICD-10-CM | POA: Diagnosis present

## 2020-02-09 MED ORDER — CYANOCOBALAMIN 1000 MCG/ML IJ SOLN
1000.0000 ug | Freq: Once | INTRAMUSCULAR | Status: AC
  Administered 2020-02-09: 1000 ug via INTRAMUSCULAR

## 2020-02-09 NOTE — Patient Instructions (Signed)

## 2020-03-03 ENCOUNTER — Telehealth: Payer: Self-pay | Admitting: *Deleted

## 2020-03-03 NOTE — Telephone Encounter (Signed)
  We can schedule an iron infusion.  M

## 2020-03-03 NOTE — Telephone Encounter (Signed)
Patient called to let Dr Mike Gip know that her "stomach surgery" is scheduled for 03/23/20 and that Dr Mike Gip said she may need an iron infusion before surgery. Also of note, she has a B12 injection scheduled for 7/7

## 2020-03-08 ENCOUNTER — Inpatient Hospital Stay: Payer: Medicare Other

## 2020-03-15 ENCOUNTER — Inpatient Hospital Stay: Payer: Medicare Other

## 2020-03-15 ENCOUNTER — Inpatient Hospital Stay: Payer: Medicare Other | Attending: Hematology and Oncology

## 2020-03-15 ENCOUNTER — Other Ambulatory Visit: Payer: Self-pay

## 2020-03-15 VITALS — BP 103/64 | HR 80 | Temp 98.4°F | Resp 18

## 2020-03-15 DIAGNOSIS — D500 Iron deficiency anemia secondary to blood loss (chronic): Secondary | ICD-10-CM

## 2020-03-15 DIAGNOSIS — E538 Deficiency of other specified B group vitamins: Secondary | ICD-10-CM | POA: Insufficient documentation

## 2020-03-15 DIAGNOSIS — D509 Iron deficiency anemia, unspecified: Secondary | ICD-10-CM | POA: Insufficient documentation

## 2020-03-15 MED ORDER — CYANOCOBALAMIN 1000 MCG/ML IJ SOLN
1000.0000 ug | Freq: Once | INTRAMUSCULAR | Status: AC
  Administered 2020-03-15: 1000 ug via INTRAMUSCULAR

## 2020-03-15 MED ORDER — IRON SUCROSE 20 MG/ML IV SOLN
INTRAVENOUS | Status: AC
  Filled 2020-03-15: qty 10

## 2020-03-15 MED ORDER — IRON SUCROSE 20 MG/ML IV SOLN
200.0000 mg | Freq: Once | INTRAVENOUS | Status: AC
  Administered 2020-03-15: 200 mg via INTRAVENOUS

## 2020-03-15 MED ORDER — SODIUM CHLORIDE 0.9 % IV SOLN: 200.0000 mg | Freq: Once | INTRAVENOUS | Status: DC

## 2020-03-15 MED ORDER — SODIUM CHLORIDE 0.9 % IV SOLN
Freq: Once | INTRAVENOUS | Status: AC
  Filled 2020-03-15: qty 250

## 2020-03-15 NOTE — Patient Instructions (Signed)

## 2020-03-23 HISTORY — PX: DUODENAL ATRESIA REPAIR: SHX1477

## 2020-04-01 ENCOUNTER — Other Ambulatory Visit: Payer: Self-pay

## 2020-04-01 DIAGNOSIS — D500 Iron deficiency anemia secondary to blood loss (chronic): Secondary | ICD-10-CM

## 2020-04-06 ENCOUNTER — Other Ambulatory Visit: Payer: Medicare Other

## 2020-04-06 ENCOUNTER — Inpatient Hospital Stay: Payer: Medicare Other

## 2020-05-03 ENCOUNTER — Inpatient Hospital Stay: Payer: Medicare Other

## 2020-05-03 ENCOUNTER — Other Ambulatory Visit: Payer: Medicare Other

## 2020-05-10 ENCOUNTER — Other Ambulatory Visit: Payer: Self-pay

## 2020-05-10 ENCOUNTER — Inpatient Hospital Stay: Payer: Medicare Other

## 2020-05-10 ENCOUNTER — Inpatient Hospital Stay: Payer: Medicare Other | Attending: Hematology and Oncology

## 2020-05-10 DIAGNOSIS — D500 Iron deficiency anemia secondary to blood loss (chronic): Secondary | ICD-10-CM

## 2020-05-10 DIAGNOSIS — E538 Deficiency of other specified B group vitamins: Secondary | ICD-10-CM | POA: Insufficient documentation

## 2020-05-10 DIAGNOSIS — D509 Iron deficiency anemia, unspecified: Secondary | ICD-10-CM | POA: Insufficient documentation

## 2020-05-10 LAB — CBC WITH DIFFERENTIAL/PLATELET
Abs Immature Granulocytes: 0.06 10*3/uL (ref 0.00–0.07)
Basophils Absolute: 0 10*3/uL (ref 0.0–0.1)
Basophils Relative: 0 %
Eosinophils Absolute: 0.2 10*3/uL (ref 0.0–0.5)
Eosinophils Relative: 2 %
HCT: 34.3 % — ABNORMAL LOW (ref 36.0–46.0)
Hemoglobin: 10.4 g/dL — ABNORMAL LOW (ref 12.0–15.0)
Immature Granulocytes: 1 %
Lymphocytes Relative: 21 %
Lymphs Abs: 1.5 10*3/uL (ref 0.7–4.0)
MCH: 30.8 pg (ref 26.0–34.0)
MCHC: 30.3 g/dL (ref 30.0–36.0)
MCV: 101.5 fL — ABNORMAL HIGH (ref 80.0–100.0)
Monocytes Absolute: 0.5 10*3/uL (ref 0.1–1.0)
Monocytes Relative: 7 %
Neutro Abs: 4.8 10*3/uL (ref 1.7–7.7)
Neutrophils Relative %: 69 %
Platelets: 413 10*3/uL — ABNORMAL HIGH (ref 150–400)
RBC: 3.38 MIL/uL — ABNORMAL LOW (ref 3.87–5.11)
RDW: 13.5 % (ref 11.5–15.5)
WBC: 7 10*3/uL (ref 4.0–10.5)
nRBC: 0 % (ref 0.0–0.2)

## 2020-05-10 MED ORDER — CYANOCOBALAMIN 1000 MCG/ML IJ SOLN
1000.0000 ug | Freq: Once | INTRAMUSCULAR | Status: AC
  Administered 2020-05-10: 1000 ug via INTRAMUSCULAR

## 2020-05-11 LAB — FERRITIN: Ferritin: 304 ng/mL (ref 11–307)

## 2020-05-11 LAB — IRON AND TIBC
Iron: 40 ug/dL (ref 28–170)
Saturation Ratios: 13 % (ref 10.4–31.8)
TIBC: 298 ug/dL (ref 250–450)
UIBC: 258 ug/dL

## 2020-05-11 LAB — FOLATE: Folate: 20.9 ng/mL (ref >5.9)

## 2020-05-18 ENCOUNTER — Other Ambulatory Visit: Payer: Self-pay

## 2020-05-18 ENCOUNTER — Encounter: Payer: Self-pay | Admitting: Gastroenterology

## 2020-05-18 ENCOUNTER — Ambulatory Visit (INDEPENDENT_AMBULATORY_CARE_PROVIDER_SITE_OTHER): Payer: Medicare Other | Admitting: Gastroenterology

## 2020-05-18 VITALS — BP 111/70 | HR 90 | Temp 97.8°F | Wt 113.4 lb

## 2020-05-18 DIAGNOSIS — R112 Nausea with vomiting, unspecified: Secondary | ICD-10-CM | POA: Diagnosis not present

## 2020-05-18 MED ORDER — SUCRALFATE 1 G PO TABS: 1.0000 g | ORAL_TABLET | Freq: Four times a day (QID) | ORAL | 1 refills | Status: DC

## 2020-05-19 NOTE — Progress Notes (Signed)
Jocelyn Antigua, MD [address]  [place]  [place], [place] [address]  Main: [phone]  Fax: [phone]   Primary Care Physician: [name], MD   Chief Complaint  Patient presents with  . Gastroesophageal Reflux    HPI: Ardelia Wrede is a 74 y.o. female here for follow-up of nausea vomiting and reflux.  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  Patient reports significant improvement in symptoms since then, with no further nausea or vomiting.  Patient utilizing small frequent meals throughout the day.  Also reports heartburn is under control.  Her question to [place] is about 4 of her medications and if she can discontinue them, and these include Colestid, Carafate, Pepcid and Protonix.  However, she does state that her surgeon asked her to be on Protonix lifelong from here on, however, notes by them in care everywhere states that decision to discontinue PPI is deferred to her GI physicians.   Current Outpatient Medications  Medication Sig Dispense Refill  . acetaminophen (TYLENOL) 500 MG tablet Take 1 tablet by mouth in the morning and at bedtime.    . Cholecalciferol (VITAMIN D3) 50 MCG (2000 UT) TABS Take 2,000 Units by mouth daily.    Marland Kitchen conjugated estrogens (PREMARIN) vaginal cream Place 0.5 g vaginally 2 (two) times a week.    . cyanocobalamin (,VITAMIN B-12,) 1000 MCG/ML injection Inject 1,000 mcg into the muscle every 30 (thirty) days.    . folic acid (FOLVITE) 263 MCG tablet Take 400 mcg by mouth daily.     . mirtazapine (REMERON) 15 MG tablet Take 15 mg by mouth at bedtime.     . Multiple Vitamin (MULTI-VITAMIN) tablet Take 1 tablet by mouth in the morning and at bedtime.    Marland Kitchen oxybutynin (DITROPAN-XL) 10 MG 24 hr tablet Take 10 mg by mouth at bedtime.   11  . pantoprazole (PROTONIX) 40 MG tablet Take 1 tablet (40 mg total) by mouth daily. 90 tablet 0  .  rOPINIRole (REQUIP) 0.25 MG tablet Take 0.25 mg by mouth at bedtime.     Mariane Baumgarten Sodium (DSS) 100 MG CAPS Take 1 tablet by mouth at bedtime. (Patient not taking: Reported on 05/18/2020)     No current facility-administered medications for this visit.    Allergies as of 05/18/2020 - Review Complete 05/18/2020  Allergen Reaction Noted  . Morphine Nausea And Vomiting 08/12/2018    ROS:  General: Negative for anorexia, weight loss, fever, chills, fatigue, weakness. ENT: Negative for hoarseness, difficulty swallowing , nasal congestion. CV: Negative for chest pain, angina, palpitations, dyspnea on exertion, peripheral edema.  Respiratory: Negative for dyspnea at rest, dyspnea on exertion, cough, sputum, wheezing.  GI: See history of present illness. GU:  Negative for dysuria, hematuria, urinary incontinence, urinary frequency, nocturnal urination.  Endo: Negative for unusual weight change.    Physical Examination:   BP 111/70   Pulse 90   Temp 97.8 F (36.6 C) (Oral)   Wt 113 lb 6.4 oz (51.4 kg)   BMI 20.74 kg/m   General: Well-nourished, well-developed in no acute distress.  Eyes: No icterus. Conjunctivae pink. Mouth: Oropharyngeal mucosa moist and pink , no lesions erythema or exudate. Neck: Supple, Trachea midline Abdomen: Bowel sounds are normal, nontender, nondistended, no hepatosplenomegaly or masses, no abdominal bruits or hernia , no rebound or guarding.   Extremities: No lower extremity edema. No clubbing or deformities. Neuro:  Alert and oriented x 3.  Grossly intact. Skin: Warm and dry, no jaundice.   Psych: Alert and cooperative, normal mood and affect.   Labs: CMP     Component Value Date/Time   NA 135 09/14/2018 0430   K 3.8 09/14/2018 0430   CL 103 09/14/2018 0430   CO2 24 09/14/2018 0430   GLUCOSE 120 (H) 09/14/2018 0430   BUN 19 09/14/2018 0430   CREATININE 0.65 09/14/2018 0430   CALCIUM 8.3 (L) 09/14/2018 0430   PROT 5.6 (L) 09/14/2018 0430    ALBUMIN 2.9 (L) 09/14/2018 0430   AST 16 09/14/2018 0430   ALT 16 09/14/2018 0430   ALKPHOS 56 09/14/2018 0430   BILITOT 0.5 09/14/2018 0430   GFRNONAA >60 09/14/2018 0430   GFRAA >60 09/14/2018 0430   Lab Results  Component Value Date   WBC 7.0 05/10/2020   HGB 10.4 (L) 05/10/2020   HCT 34.3 (L) 05/10/2020   MCV 101.5 (H) 05/10/2020   PLT 413 (H) 05/10/2020    Imaging Studies: No results found.  Assessment and Plan:   Zahli Vetsch is a 74 y.o. 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  I did not start Colestid myself but it appears that it was started for diarrhea by her PCP.  Since no further diarrhea and patient would like to decrease her medications, okay to discontinue.  If it reoccurs, this can be reconsidered along with discussion with her PCP  She has already discontinued Pepcid.  Continue to hold.  Discontinue Carafate  We will reach out to her surgeon's team to ask if they do in fact want her to continue Protonix long-term, and if not I do not see any reason from my perspective for her to continue Protonix long-term and this can be discontinued to avoid any side effects.    Dr Jocelyn Case

## 2020-05-31 ENCOUNTER — Inpatient Hospital Stay: Payer: Medicare Other

## 2020-06-08 ENCOUNTER — Other Ambulatory Visit: Payer: Self-pay

## 2020-06-08 ENCOUNTER — Inpatient Hospital Stay: Payer: Medicare Other | Attending: Hematology and Oncology

## 2020-06-08 DIAGNOSIS — E538 Deficiency of other specified B group vitamins: Secondary | ICD-10-CM | POA: Diagnosis not present

## 2020-06-08 DIAGNOSIS — D500 Iron deficiency anemia secondary to blood loss (chronic): Secondary | ICD-10-CM

## 2020-06-08 DIAGNOSIS — Z79899 Other long term (current) drug therapy: Secondary | ICD-10-CM | POA: Insufficient documentation

## 2020-06-08 DIAGNOSIS — D509 Iron deficiency anemia, unspecified: Secondary | ICD-10-CM | POA: Insufficient documentation

## 2020-06-08 MED ORDER — CYANOCOBALAMIN 1000 MCG/ML IJ SOLN
1000.0000 ug | Freq: Once | INTRAMUSCULAR | Status: AC
  Administered 2020-06-08: 1000 ug via INTRAMUSCULAR
  Filled 2020-06-08: qty 1

## 2020-06-14 ENCOUNTER — Inpatient Hospital Stay: Payer: Medicare Other

## 2020-06-25 ENCOUNTER — Other Ambulatory Visit: Payer: Medicare Other

## 2020-06-28 ENCOUNTER — Ambulatory Visit: Payer: Medicare Other | Admitting: Hematology and Oncology

## 2020-06-28 ENCOUNTER — Ambulatory Visit: Payer: Medicare Other

## 2020-06-29 ENCOUNTER — Other Ambulatory Visit: Payer: Self-pay

## 2020-06-29 DIAGNOSIS — E538 Deficiency of other specified B group vitamins: Secondary | ICD-10-CM

## 2020-06-29 DIAGNOSIS — D500 Iron deficiency anemia secondary to blood loss (chronic): Secondary | ICD-10-CM

## 2020-07-01 ENCOUNTER — Inpatient Hospital Stay: Payer: Medicare Other

## 2020-07-01 ENCOUNTER — Other Ambulatory Visit: Payer: Self-pay

## 2020-07-01 DIAGNOSIS — E538 Deficiency of other specified B group vitamins: Secondary | ICD-10-CM

## 2020-07-01 DIAGNOSIS — D500 Iron deficiency anemia secondary to blood loss (chronic): Secondary | ICD-10-CM

## 2020-07-01 LAB — CBC WITH DIFFERENTIAL/PLATELET
Abs Immature Granulocytes: 0.01 10*3/uL (ref 0.00–0.07)
Basophils Absolute: 0 10*3/uL (ref 0.0–0.1)
Basophils Relative: 0 %
Eosinophils Absolute: 0.2 10*3/uL (ref 0.0–0.5)
Eosinophils Relative: 4 %
HCT: 36.2 % (ref 36.0–46.0)
Hemoglobin: 11.2 g/dL — ABNORMAL LOW (ref 12.0–15.0)
Immature Granulocytes: 0 %
Lymphocytes Relative: 25 %
Lymphs Abs: 1.3 10*3/uL (ref 0.7–4.0)
MCH: 31.5 pg (ref 26.0–34.0)
MCHC: 30.9 g/dL (ref 30.0–36.0)
MCV: 101.7 fL — ABNORMAL HIGH (ref 80.0–100.0)
Monocytes Absolute: 0.5 10*3/uL (ref 0.1–1.0)
Monocytes Relative: 10 %
Neutro Abs: 3.2 10*3/uL (ref 1.7–7.7)
Neutrophils Relative %: 61 %
Platelets: 291 10*3/uL (ref 150–400)
RBC: 3.56 MIL/uL — ABNORMAL LOW (ref 3.87–5.11)
RDW: 15.2 % (ref 11.5–15.5)
WBC: 5.3 10*3/uL (ref 4.0–10.5)
nRBC: 0 % (ref 0.0–0.2)

## 2020-07-01 LAB — SEDIMENTATION RATE: Sed Rate: 20 mm/hr (ref 0–30)

## 2020-07-01 LAB — FERRITIN: Ferritin: 147 ng/mL (ref 11–307)

## 2020-07-04 NOTE — Progress Notes (Signed)
[facility]  [address] [place], [place] [address] Phone: [phone]  Fax: [phone]   Clinic Day:  07/05/2020  Referring physician: [name], MD  Chief Complaint: Jocelyn Case is a 74 y.o. female with s/p partial gastrectomywith B12 deficiency andiron deficiency who is seen for 6 month assessment.   HPI: The patient was last seen in the hematology clinic on 01/12/2020. At that time, she felt okay.  She had restless legs.  She denied any pica.  Exam was normal. Hematocrit was 35.7, hemoglobin 11.0, MCV 99.4, platelets 221,000, WBC 7,600. Ferritin was 52 with an iron saturation of 7% (low) and a TIBC of 336.  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 Donnal Moat. Pathology revealed a benign epithelium-lining cyst and small bowel with ulcerated anastomosis and focal fibrous serosal adhesion.  She followed-up with Dr. Bonna Gains on 05/18/2020 after surgery. She reported significant improvement in symptoms and denied any nausea and vomiting. She was able to eat small frequent meals throughout the day and her heartburn was under control.  Labs followed: 05/10/2020: Hematocrit 34.3, hemoglobin 10.4, MCV 101.5, platelets 413,000, WBC 7,000. Ferritin 304. Iron saturation 13%. TIBC 298. 07/01/2020: Hematocrit 36.2, hemoglobin 11.2, MCV 101.7, platelets 291,000, WBC 5,300. Ferritin 147. Sed rate 20.  Vitamin B12 was 783 on 06/22/2020. Folate was 20.9 on 05/10/2020.  She received Venofer on 01/12/2020, 01/19/2020, and 03/15/2020.  She received Vitamin B12 injections monthly (01/12/2020 - 06/08/2020).  During the interim, she has been "ok". She is currently being treated for an ongoing UTI; she reports burning, urgency, and frequency. She is on Keflex daily for 3 months. Her restless legs have improved. She denies ice pica and bleeding  of any kind. She eats regular small amounts of food. She has not vomited since her surgery.   Past Medical History:  Diagnosis Date  . Anemia   . Blood transfusion without reported diagnosis   . Complication of anesthesia   . CPAP (continuous positive airway pressure) dependence   . GERD (gastroesophageal reflux disease)   . PONV (postoperative nausea and vomiting)   . Restless leg syndrome 2015   R leg   . Sleep apnea     Past Surgical History:  Procedure Laterality Date  . ABDOMINAL HYSTERECTOMY    . APPENDECTOMY    . CHOLECYSTECTOMY    . COLONOSCOPY  2 years ago  . 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 ESOPHAGOGASTRODUODENOSCOPY (EGD) WITH PROPOFOL N/A 02/14/2018   Procedure: ESOPHAGOGASTRODUODENOSCOPY (EGD) WITH PROPOFOL;  Surgeon: Virgel Manifold, MD;  Location: [facility] ENDOSCOPY;  Service: Endoscopy;  Laterality: N/A;  . ESOPHAGOGASTRODUODENOSCOPY (EGD) WITH PROPOFOL N/A 02/15/2018   Procedure: ESOPHAGOGASTRODUODENOSCOPY (EGD) WITH PROPOFOL;  Surgeon: Virgel Manifold, MD;  Location: [facility] ENDOSCOPY;  Service: Endoscopy;  Laterality: N/A;  . ESOPHAGOGASTRODUODENOSCOPY (EGD) WITH PROPOFOL N/A 07/05/2018   Procedure: ESOPHAGOGASTRODUODENOSCOPY (EGD) WITH PROPOFOL;  Surgeon: Virgel Manifold, MD;  Location: [facility] ENDOSCOPY;  Service: Endoscopy;  Laterality: N/A;  . STOMACH SURGERY  2013  . UPPER GASTROINTESTINAL ENDOSCOPY  05/2017   pt stated "bleeding ulcer and stopped up duodenum was noted"    Family History  Problem Relation Age of Onset  . Peptic Ulcer Disease Mother   . Dementia Mother   . COPD Father   . Colon cancer Father     Social History:  reports that she quit smoking about 45 years ago. She has never used smokeless tobacco. She reports previous alcohol use. She reports that she does not use drugs.  She drinks wine in the evening. She previously lived in [place]. She moved to [place] in  05/2017. Her daughter lives in [place]. She is temporarily living in an apartment in [place].She has a Therapist, occupational.The patient is alone today.  Allergies:  Allergies  Allergen Reactions  . Morphine Nausea And Vomiting    Current Medications: Current Outpatient Medications  Medication Sig Dispense Refill  . acetaminophen (TYLENOL) 500 MG tablet Take 1 tablet by mouth in the morning and at bedtime.    . cephALEXin (KEFLEX) 250 MG capsule Take 250 mg by mouth daily.    . Cholecalciferol (VITAMIN D3) 50 MCG (2000 UT) TABS Take 2,000 Units by mouth daily.    . cyanocobalamin (,VITAMIN B-12,) 1000 MCG/ML injection Inject 1,000 mcg into the muscle every 30 (thirty) days.    Mariane Baumgarten Sodium (DSS) 100 MG CAPS Take 1 tablet by mouth at bedtime as needed.     . mirtazapine (REMERON) 15 MG tablet Take 15 mg by mouth at bedtime.     . Multiple Vitamin (MULTI-VITAMIN) tablet Take 1 tablet by mouth in the morning and at bedtime.    . pantoprazole (PROTONIX) 40 MG tablet Take 1 tablet (40 mg total) by mouth daily. 90 tablet 0  . rOPINIRole (REQUIP) 0.25 MG tablet Take 0.25 mg by mouth at bedtime.      No current facility-administered medications for this visit.    Review of Systems  Constitutional: Positive for weight loss (5 lbs). Negative for chills, diaphoresis, fever and malaise/fatigue.  HENT: Positive for hearing loss (hearing aids). Negative for congestion, ear discharge, ear pain, nosebleeds, sinus pain, sore throat and tinnitus.   Eyes: Negative.  Negative for blurred vision, photophobia and pain.  Respiratory: Negative.  Negative for cough, hemoptysis, sputum production and shortness of breath.   Cardiovascular: Negative.  Negative for chest pain, palpitations and leg swelling.  Gastrointestinal: Negative for abdominal pain, blood in stool, constipation, diarrhea, heartburn, melena, nausea and vomiting.       Denies ice pica. Eats small amounts of food.  Genitourinary:  Positive for frequency and urgency. Negative for dysuria (burning) and hematuria.       Ongoing UTI, on Keflex  Musculoskeletal: Negative.  Negative for back pain, joint pain, myalgias and neck pain.  Skin: Negative.  Negative for itching and rash.  Neurological: Negative for dizziness, tingling, sensory change, speech change, weakness and headaches.       Restless legs have improved.  Endo/Heme/Allergies: Negative.  Negative for environmental allergies. Does not bruise/bleed easily.  Psychiatric/Behavioral: Negative.  Negative for depression and memory loss. The patient is not nervous/anxious and does not have insomnia.   All other systems reviewed and are negative.  Performance status (ECOG): 0  Vitals Blood pressure (!) 115/55, pulse 74, temperature 98.7 F (37.1 C), temperature source Tympanic, weight 116 lb 1.2 oz (52.7 kg), SpO2 99 %.   Physical Exam Vitals and nursing note reviewed.  Constitutional:      General: She is not in acute distress.    Appearance: She is well-developed. She is not diaphoretic.  HENT:     Head: Normocephalic and atraumatic.     Comments: Short blonde hair.    Mouth/Throat:     Mouth: Mucous membranes are moist.     Pharynx: Oropharynx is clear.  Eyes:  General: No scleral icterus.    Extraocular Movements: Extraocular movements intact.     Conjunctiva/sclera: Conjunctivae normal.     Pupils: Pupils are equal, round, and reactive to light.     Comments: Glasses. Blue eyes.  Neck:     Vascular: No JVD.  Cardiovascular:     Rate and Rhythm: Normal rate and regular rhythm.     Heart sounds: Normal heart sounds. No murmur heard.   Pulmonary:     Effort: Pulmonary effort is normal. No respiratory distress.     Breath sounds: Normal breath sounds. No wheezing or rales.  Abdominal:     General: Bowel sounds are normal. There is no distension.     Palpations: Abdomen is soft. There is no hepatomegaly, splenomegaly or mass.     Tenderness: There  is no abdominal tenderness. There is no guarding or rebound.  Musculoskeletal:        General: Normal range of motion.     Cervical back: Normal range of motion and neck supple.  Lymphadenopathy:     Head:     Right side of head: No preauricular, posterior auricular or occipital adenopathy.     Left side of head: No preauricular, posterior auricular or occipital adenopathy.     Cervical: No cervical adenopathy.     Upper Body:     Right upper body: No supraclavicular or axillary adenopathy.     Left upper body: No supraclavicular or axillary adenopathy.     Lower Body: No right inguinal adenopathy. No left inguinal adenopathy.  Skin:    General: Skin is warm and dry.     Coloration: Skin is not pale.     Findings: No erythema or rash.  Neurological:     Mental Status: She is alert and oriented to person, place, and time.  Psychiatric:        Behavior: Behavior normal.        Thought Content: Thought content normal.        Judgment: Judgment normal.    No visits with results within 3 Day(s) from this visit.  Latest known visit with results is:  Appointment on 07/01/2020  Component Date Value Ref Range Status  . Sed Rate 07/01/2020 20  0 - 30 mm/hr Final   Performed at [facility], [address] [place], [place] [address]  . Ferritin 07/01/2020 147  11 - 307 ng/mL Final   Performed at [facility], [address] [place], [place] [address]  . WBC 07/01/2020 5.3  4.0 - 10.5 K/uL Final  . RBC 07/01/2020 3.56* 3.87 - 5.11 MIL/uL Final  . Hemoglobin 07/01/2020 11.2* 12.0 - 15.0 g/dL Final  . HCT 07/01/2020 36.2  36 - 46 % Final  . MCV 07/01/2020 101.7* 80.0 - 100.0 fL Final  . MCH 07/01/2020 31.5  26.0 - 34.0 pg Final  . MCHC 07/01/2020 30.9  30.0 - 36.0 g/dL Final  . RDW 07/01/2020 15.2  11.5 - 15.5 % Final  . Platelets 07/01/2020 291  150 - 400 K/uL Final  . nRBC 07/01/2020 0.0  0.0 - 0.2 % Final  . Neutrophils Relative % 07/01/2020 61  % Final  .  Neutro Abs 07/01/2020 3.2  1.7 - 7.7 K/uL Final  . Lymphocytes Relative 07/01/2020 25  % Final  . Lymphs Abs 07/01/2020 1.3  0.7 - 4.0 K/uL Final  . Monocytes Relative 07/01/2020 10  % Final  . Monocytes Absolute 07/01/2020 0.5  0 - 1  K/uL Final  . Eosinophils Relative 07/01/2020 4  % Final  . Eosinophils Absolute 07/01/2020 0.2  0 - 0 K/uL Final  . Basophils Relative 07/01/2020 0  % Final  . Basophils Absolute 07/01/2020 0.0  0 - 0 K/uL Final  . Immature Granulocytes 07/01/2020 0  % Final  . Abs Immature Granulocytes 07/01/2020 0.01  0.00 - 0.07 K/uL Final   Performed at [facility], [address] [place], [place] [address]    Assessment:  Aigner Horseman is a 74 y.o. 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  She has iron deficiency anemia. She previously received IV iron off/on every 6 months for the past 3 years while living in [place]. She is intolerant of oral iron.  Work-up on 02/22/2019revealed a hematocrit of 31, hemoglobin 9.6, MCV 84.7, platelets 379,000, WBC 5600 with an ANC of 3200. Ferritin was 7. B12 was 267. Folate was 28.  She receivedVenoferweekly x 4 (11/30/2017 - 12/21/2017), x 2 (04/16/2018 - 04/23/2018), x 2 (03/27/2019 - 04/03/2019), 06/24/2019, x 2 (01/12/2020 - 01/19/2020) and 03/15/2020.  Ferritinhas been followed: 5 on 11/05/2017, 7 on 11/23/2017, 71 on 01/04/2018, 45 on 02/15/2018, 12 on 04/08/2018, 130 on 05/21/2018, 132 on 07/10/2018, 54 on 10/17/2018,44 on 10/31/2018, 11 on 03/26/2019, 32 on 06/19/2019, 43 on 09/10/2019, 69 on 11/07/2019, 52 on 01/09/2020, 304 on 05/10/2020, and 147 on 07/01/2020.  She receives B12IM monthly (last  06/08/2020). She is on folic acid. Folate was 20.9on 05/10/2020.  EGDon 02/15/2018 revealed non-bleeding gastric ulcers at the anastomosis, with no stigmata of bleeding. Biopsied. A Billroth II anastomosis characterized by ulceration and mild  stenosis was dilated. Jejunal polyp(s) were biopsied. Pathology revealed no evidence of malignancy. EGDon 07/05/2018 revealed non-bleeding gastric ulcer with no stigmata of bleeding. 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 Duodenal stent was placed on 01/14/2020and removed on 11/29/2018.EGD on 02/20/2019 revealed gastrointestinal mucosa with chronic active inflammation, marked erosion and reactive epithelial changes.H pylori and CMV were negative.  EGD on 05/26/2019 removed he AXIOS stent. She underwent gastrojejunostomy revision w/o vagotomy and resection of Braun enteroenterostomy on 03/23/2020. Pathology revealed a benign epithelium-lining cyst and small bowel with ulcerated anastomosis and focal fibrous serosal adhesion.  Patient received the [place] COVID-19 vaccine. Her last shot was on 11/15/2019.   Symptomatically, she feels "ok". She is currently being treated for a UTI.  Restless legs have improved. She denies ice pica and bleeding of any kind. She eats regular small amounts of food. She has not vomited since her surgery.  Exam is unremarkable.  Plan: 1.   Review labs from 07/01/2020. 2.Iron deficency anemia Hematocrit 36.2.  Hemoglobin 11.2. MCV 101.7. [id number]. No Venofer today. 3.B12 deficiency B12 today and monthly x6.  Folate was 20.9 on 05/10/2020. Check folate annually. 4.Weight loss Weight down 5 pounds.  Patient feeling much better since interval surgery.  Continue to monitor. 5. 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  6.   B12 today and monthly x 6. 7.   RTC in 3 months for labs (CBC, ferritin) and B12. 8.   RTC in 6 months for MD assessment, labs (CBC with diff, ferritin, iron studies-day before), B12 and +/- Venofer.  I discussed the assessment and treatment plan with the patient.  The patient was provided an opportunity  to ask questions and all were answered.  The patient agreed  with the plan and demonstrated an understanding of the instructions.  The patient was advised to call back if the symptoms worsen or if the condition fails to improve as anticipated.   Lequita Asal, MD, PhD    07/05/2020, 1:31 PM  I, Mirian Mo Tufford, am acting as Education administrator for [employer]. Mike Gip, MD, PhD.  I, Melissa C. Mike Gip, MD, have reviewed the above documentation for accuracy and completeness, and I agree with the above.

## 2020-07-05 ENCOUNTER — Encounter: Payer: Self-pay | Admitting: Hematology and Oncology

## 2020-07-05 ENCOUNTER — Other Ambulatory Visit: Payer: Self-pay | Admitting: *Deleted

## 2020-07-05 ENCOUNTER — Other Ambulatory Visit: Payer: Self-pay

## 2020-07-05 ENCOUNTER — Ambulatory Visit: Payer: Medicare Other

## 2020-07-05 ENCOUNTER — Inpatient Hospital Stay: Payer: Medicare Other | Attending: Hematology and Oncology | Admitting: Hematology and Oncology

## 2020-07-05 VITALS — BP 115/55 | HR 74 | Temp 98.7°F | Wt 116.1 lb

## 2020-07-05 DIAGNOSIS — E538 Deficiency of other specified B group vitamins: Secondary | ICD-10-CM

## 2020-07-05 DIAGNOSIS — D500 Iron deficiency anemia secondary to blood loss (chronic): Secondary | ICD-10-CM

## 2020-07-05 MED ORDER — CYANOCOBALAMIN 1000 MCG/ML IJ SOLN
1000.0000 ug | Freq: Once | INTRAMUSCULAR | Status: AC
  Administered 2020-07-05: 1000 ug via INTRAMUSCULAR

## 2020-07-05 MED ORDER — CYANOCOBALAMIN 1000 MCG/ML IJ SOLN
INTRAMUSCULAR | Status: AC
  Filled 2020-07-05: qty 1

## 2020-07-05 NOTE — Progress Notes (Signed)
Had GI surgery on her duodenal because food could not go through. She is eating good about 50 % or better from surgery. Having loose stools but not diarrhea but was told that with the surgery it will do that. She having trouble sleeping because she has to go to restroom due to UTI.

## 2020-07-30 ENCOUNTER — Other Ambulatory Visit: Payer: Self-pay

## 2020-07-30 ENCOUNTER — Inpatient Hospital Stay: Payer: Medicare Other

## 2020-07-30 DIAGNOSIS — D500 Iron deficiency anemia secondary to blood loss (chronic): Secondary | ICD-10-CM

## 2020-07-30 DIAGNOSIS — E538 Deficiency of other specified B group vitamins: Secondary | ICD-10-CM | POA: Diagnosis not present

## 2020-07-30 MED ORDER — CYANOCOBALAMIN 1000 MCG/ML IJ SOLN
1000.0000 ug | Freq: Once | INTRAMUSCULAR | Status: AC
  Administered 2020-07-30: 1000 ug via INTRAMUSCULAR

## 2020-07-30 MED ORDER — CYANOCOBALAMIN 1000 MCG/ML IJ SOLN
INTRAMUSCULAR | Status: AC
  Filled 2020-07-30: qty 1

## 2020-08-02 ENCOUNTER — Inpatient Hospital Stay: Payer: Medicare Other

## 2020-08-17 ENCOUNTER — Other Ambulatory Visit: Payer: Self-pay

## 2020-08-18 ENCOUNTER — Ambulatory Visit (INDEPENDENT_AMBULATORY_CARE_PROVIDER_SITE_OTHER): Payer: Medicare Other | Admitting: Gastroenterology

## 2020-08-18 ENCOUNTER — Encounter: Payer: Self-pay | Admitting: Gastroenterology

## 2020-08-18 ENCOUNTER — Other Ambulatory Visit: Payer: Self-pay

## 2020-08-18 VITALS — BP 125/77 | HR 86 | Temp 97.7°F | Wt 120.0 lb

## 2020-08-18 DIAGNOSIS — Z800 Family history of malignant neoplasm of digestive organs: Secondary | ICD-10-CM

## 2020-08-18 DIAGNOSIS — K219 Gastro-esophageal reflux disease without esophagitis: Secondary | ICD-10-CM

## 2020-08-18 MED ORDER — NA SULFATE-K SULFATE-MG SULF 17.5-3.13-1.6 GM/177ML PO SOLN: ORAL | 0 refills | Status: DC

## 2020-08-18 NOTE — Progress Notes (Signed)
[name], MD [address]  [place]  [place], [place] [address]  Main: [phone]  Fax: [phone]   Primary Care Physician: [name], MD   Chief Complaint  Patient presents with  . Nausea    HPI: Jocelyn Case is a 74 y.o. female with history of recent revision surgery with previous Billroth II converted to Roux-en-Y gastric bypass in June 2021.  The patient denies abdominal or flank pain, anorexia, nausea or vomiting, dysphagia, change in bowel habits or black or bloody stools or weight loss.  Is taking Protonix 40 mg once daily, with no breakthrough symptoms. Previous history: Last EGD October 2019, with evidence of patent Billroth II gastrojejunostomy.  Mild stenosis and ulceration seen at the [facility] digital anastomosis.  Dilation to 16.5 mm performed with through-the-scope balloon dilator.  underwent EGD in May 2019, due to intermittent nausea and vomiting.The initial procedure showed large amount of fluid in the stomach, and the procedure had to be rescheduled for next day. Her future procedures should always does be done with 2 days of clear liquid diet , and n.p.o. past midnight.Mild stenosis was noted at the Billroth II anastomosis site, and through-the-scope dilator was used to dilate to 13.14mm.Since the dilation, she reports much improved symptoms.   Is alsoon PPI, due to acid reflux, and ulcerated mucosa seen at the anastomosis site, which she states has improved her symptoms as far as relieving her heartburn.  Findings: The examined esophagus was normal. 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 Retained fluid was found in the entire examined stomach. Removed  immediately after entering the stomach. No residual fluid or food left  after completely suctioning it. Few non-bleeding superficial gastric  ulcers with no stigmata of bleeding  were found at the anastomosis. The largest lesion was 5 mm in largest  dimension. Biopsies were taken with a cold forceps for histology. 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 This was  characterized by mild stenosis and ulceration. A TTS dilator was passed  through the scope. Dilation with a 12-13.5-15 mm balloon dilator was  performed to 13.5 mm. The dilation site was examined and showed good  heme effect. A 2 to 4 mm sessile polyp with no bleeding was found in the jejunum.  Biopsies were taken with a cold forceps for histology. Exam of the jejunum was otherwise normal. Both limbs of the Bilroth II anatomy were intubated and appeared normal  with wide openings and no narrowing. Impression: - Normal esophagus. Biopsied. GE Junction at 36 cm - Retained gastric fluid. - Non-bleeding gastric ulcers at the anastomosis, with  no stigmata of bleeding. Biopsied. - A Billroth II anastomosis was found at 48 cm,  characterized by ulceration and mild stenosis. Dilated. - Jejunal polyp(s). Biopsied. These appeared to be  inflammatory polyps - Both limbs of the Bilroth II anatomy were intubated  and appeared normal with wide openings and no narrowing. Recommendation: - Take prescribed proton pump inhibitor or H2 blocker  (antacid) medications 30 - 60 minutes before meals. - Follow an antireflux regimen. - Await pathology results.  DIAGNOSIS:  A. POLYP, SMALL BOWEL; COLD BIOPSY:  - POLYPOID GRANULATION TISSUE.  - FOCAL AREA OF GASTRIC TYPE MUCOSA SEEN.  - NEGATIVE FOR INFECTIOUS AGENTS, DYSPLASIA AND  MALIGNANCY.   B. ANASTOMOTIC ULCER; COLD BIOPSY:  - FRAGMENTS OF ULCERATION WITH FRAGMENTS OF MODERATELY INFLAMED GASTRIC  AND SMALL BOWEL MUCOSA.  - NEGATIVE FOR H. PYLORI, DYSPLASIA AND MALIGNANCY.   C. ESOPHAGUS; COLD BIOPSY:  - INFLAMED ESOPHAGEAL MUCOSA WITH SPONGIOSIS AND UP TO 5 EOSINOPHILS AND  A HIGH-POWER FIELD.  - NEGATIVE FOR DYSPLASIA AND MALIGNANCY.   Note: Changes in the esophagus are most consistent with reflux  esophagitis.   Previous history: As per notes, patient underwent endoscopy in August 2018 "revealing tight opening at the prior surgical area, mucosal ulceration and friable mucosa was detected, biopsy completed."It appears that the initial EGD could not be completed due to food in the stomach, and had to be repeated after 2 days of fasting.  Last colonoscopy date: 2015  Actual colonoscopy and EGD report not available. Above records obtained from clinic notes sent over from [place].  MRCP from February 2008: "Stable MRI of the abdomen and MRCP of the abdomen. There has been no change since prior examination from November 2007. No evidence of intra-or extrahepatic biliary duct dilation. No retained stone. Pancreatic duct appears within normal limits."  See [facility]. 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  Their note also states"she does have evidence of bacterial overgrowth and culture and this probably explains a steatorrhea documented on fecal fat testing."     Current Outpatient Medications  Medication Sig Dispense Refill  . acetaminophen (TYLENOL) 500 MG tablet Take 1 tablet by mouth in the morning and at bedtime.      . cephALEXin (KEFLEX) 250 MG capsule Take 250 mg by mouth daily.    . Cholecalciferol (VITAMIN D3) 50 MCG (2000 UT) TABS Take 2,000 Units by mouth daily.    . cyanocobalamin (,VITAMIN B-12,) 1000 MCG/ML injection Inject 1,000 mcg into the muscle every 30 (thirty) days.    . mirtazapine (REMERON) 15 MG tablet Take 15 mg by mouth at bedtime.     . Multiple Vitamin (MULTI-VITAMIN) tablet Take 1 tablet by mouth in the morning and at bedtime.    . pantoprazole (PROTONIX) 40 MG tablet Take 1 tablet (40 mg total) by mouth daily. 90 tablet 0  . rOPINIRole (REQUIP) 0.25 MG tablet Take 0.25 mg by mouth at bedtime.     Mariane Baumgarten Sodium (DSS) 100 MG CAPS Take 1 tablet by mouth at bedtime as needed.  (Patient not taking: Reported on 08/18/2020)    . Na Sulfate-K Sulfate-Mg Sulf 17.5-3.13-1.6 GM/177ML SOLN At 5 PM the day before procedure take 1 bottle and 5 hours before procedure take 1 bottle. 354 mL 0   No current facility-administered medications for this visit.    Allergies as of 08/18/2020 - Review Complete 08/18/2020  Allergen Reaction Noted  . Morphine Nausea And Vomiting 08/12/2018    ROS:  General: Negative for anorexia, weight loss, fever, chills, fatigue, weakness. ENT: Negative for hoarseness, difficulty swallowing , nasal congestion. CV: Negative for chest pain, angina, palpitations, dyspnea on exertion, peripheral edema.  Respiratory: Negative for dyspnea at rest, dyspnea on exertion, cough, sputum, wheezing.  GI: See history of present illness. GU:  Negative for dysuria, hematuria, urinary incontinence, urinary frequency, nocturnal urination.  Endo: Negative for unusual weight change.    Physical Examination:   BP 125/77   Pulse 86   Temp 97.7 F (36.5 C) (Oral)  Wt 120 lb (54.4 kg)   BMI 21.95 kg/m   General: Well-nourished, well-developed in no acute distress.  Eyes: No icterus. Conjunctivae pink. Mouth: Oropharyngeal mucosa moist and pink , no lesions erythema or  exudate. Neck: Supple, Trachea midline Abdomen: Bowel sounds are normal, nontender, nondistended, no hepatosplenomegaly or masses, no abdominal bruits or hernia , no rebound or guarding.   Extremities: No lower extremity edema. No clubbing or deformities. Neuro: Alert and oriented x 3.  Grossly intact. Skin: Warm and dry, no jaundice.   Psych: Alert and cooperative, normal mood and affect.   Labs: CMP     Component Value Date/Time   NA 135 09/14/2018 0430   K 3.8 09/14/2018 0430   CL 103 09/14/2018 0430   CO2 24 09/14/2018 0430   GLUCOSE 120 (H) 09/14/2018 0430   BUN 19 09/14/2018 0430   CREATININE 0.65 09/14/2018 0430   CALCIUM 8.3 (L) 09/14/2018 0430   PROT 5.6 (L) 09/14/2018 0430   ALBUMIN 2.9 (L) 09/14/2018 0430   AST 16 09/14/2018 0430   ALT 16 09/14/2018 0430   ALKPHOS 56 09/14/2018 0430   BILITOT 0.5 09/14/2018 0430   GFRNONAA >60 09/14/2018 0430   GFRAA >60 09/14/2018 0430   Lab Results  Component Value Date   WBC 5.3 07/01/2020   HGB 11.2 (L) 07/01/2020   HCT 36.2 07/01/2020   MCV 101.7 (H) 07/01/2020   PLT 291 07/01/2020    Imaging Studies: No results found.  Assessment and Plan:   Nickisha Hum is a 74 y.o. y/o female with previous history of Billroth II, with recent revision to Roux-en-Y gastric bypass with significant improvement in appetite, nausea vomiting since then, here for follow-up  I discussed discontinuing her Protonix entirely since she is not having any breakthrough symptoms and I do not see any reason for continuing her Protonix at this time  She states she would be willing to try this after she comes back from her Thanksgiving vacation as she does not want any symptoms to start while she is out of town.  This is reasonable.  Once she discontinues the medication, if she starts having reflux symptoms again I have asked her to call us.  At that time we can start her on a lower dose of Protonix, 20 mg once daily and she is agreeable to this  plan  She is due for screening colonoscopy for family history of colon cancer and she would like to schedule this for February  Dr [name]

## 2020-08-30 ENCOUNTER — Inpatient Hospital Stay: Payer: Medicare Other

## 2020-09-01 ENCOUNTER — Other Ambulatory Visit: Payer: Self-pay

## 2020-09-01 ENCOUNTER — Inpatient Hospital Stay: Payer: Medicare Other | Attending: Hematology and Oncology

## 2020-09-01 DIAGNOSIS — D500 Iron deficiency anemia secondary to blood loss (chronic): Secondary | ICD-10-CM

## 2020-09-01 DIAGNOSIS — E538 Deficiency of other specified B group vitamins: Secondary | ICD-10-CM | POA: Diagnosis present

## 2020-09-01 DIAGNOSIS — D509 Iron deficiency anemia, unspecified: Secondary | ICD-10-CM | POA: Insufficient documentation

## 2020-09-01 MED ORDER — CYANOCOBALAMIN 1000 MCG/ML IJ SOLN
1000.0000 ug | Freq: Once | INTRAMUSCULAR | Status: AC
  Administered 2020-09-01: 1000 ug via INTRAMUSCULAR
  Filled 2020-09-01: qty 1

## 2020-09-27 ENCOUNTER — Inpatient Hospital Stay: Payer: Medicare Other

## 2020-09-27 ENCOUNTER — Other Ambulatory Visit: Payer: Self-pay

## 2020-09-27 ENCOUNTER — Other Ambulatory Visit: Payer: Self-pay | Admitting: Hematology and Oncology

## 2020-09-27 DIAGNOSIS — E538 Deficiency of other specified B group vitamins: Secondary | ICD-10-CM | POA: Diagnosis not present

## 2020-09-27 DIAGNOSIS — D500 Iron deficiency anemia secondary to blood loss (chronic): Secondary | ICD-10-CM

## 2020-09-27 LAB — CBC
HCT: 35.4 % — ABNORMAL LOW (ref 36.0–46.0)
Hemoglobin: 10.9 g/dL — ABNORMAL LOW (ref 12.0–15.0)
MCH: 32.1 pg (ref 26.0–34.0)
MCHC: 30.8 g/dL (ref 30.0–36.0)
MCV: 104.1 fL — ABNORMAL HIGH (ref 80.0–100.0)
Platelets: 221 10*3/uL (ref 150–400)
RBC: 3.4 MIL/uL — ABNORMAL LOW (ref 3.87–5.11)
RDW: 13.4 % (ref 11.5–15.5)
WBC: 3.8 10*3/uL — ABNORMAL LOW (ref 4.0–10.5)
nRBC: 0 % (ref 0.0–0.2)

## 2020-09-27 LAB — FERRITIN: Ferritin: 86 ng/mL (ref 11–307)

## 2020-09-27 MED ORDER — CYANOCOBALAMIN 1000 MCG/ML IJ SOLN
1000.0000 ug | Freq: Once | INTRAMUSCULAR | Status: AC
  Administered 2020-09-27: 14:00:00 1000 ug via INTRAMUSCULAR
  Filled 2020-09-27: qty 1

## 2020-09-28 LAB — VITAMIN B12: Vitamin B-12: 879 pg/mL (ref 180–914)

## 2020-10-25 ENCOUNTER — Inpatient Hospital Stay: Payer: TRICARE For Life (TFL) | Attending: Hematology and Oncology

## 2020-11-15 ENCOUNTER — Inpatient Hospital Stay: Admission: RE | Admit: 2020-11-15 | Payer: Medicare Other | Source: Ambulatory Visit

## 2020-11-17 ENCOUNTER — Ambulatory Visit: Admission: RE | Admit: 2020-11-17 | Payer: Medicare Other | Source: Home / Self Care | Admitting: Gastroenterology

## 2020-11-17 ENCOUNTER — Encounter: Admission: RE | Payer: Self-pay | Source: Home / Self Care

## 2020-11-17 SURGERY — COLONOSCOPY WITH PROPOFOL
Anesthesia: General

## 2020-11-22 ENCOUNTER — Inpatient Hospital Stay: Payer: Medicare Other | Attending: Hematology and Oncology

## 2020-11-22 ENCOUNTER — Other Ambulatory Visit: Payer: Self-pay

## 2020-11-22 DIAGNOSIS — E538 Deficiency of other specified B group vitamins: Secondary | ICD-10-CM | POA: Insufficient documentation

## 2020-11-22 DIAGNOSIS — D500 Iron deficiency anemia secondary to blood loss (chronic): Secondary | ICD-10-CM

## 2020-11-22 MED ORDER — CYANOCOBALAMIN 1000 MCG/ML IJ SOLN
1000.0000 ug | Freq: Once | INTRAMUSCULAR | Status: AC
  Administered 2020-11-22: 1000 ug via INTRAMUSCULAR
  Filled 2020-11-22: qty 1

## 2020-12-16 ENCOUNTER — Other Ambulatory Visit: Payer: Self-pay

## 2020-12-16 ENCOUNTER — Inpatient Hospital Stay: Payer: Medicare Other | Attending: Hematology and Oncology

## 2020-12-16 DIAGNOSIS — D509 Iron deficiency anemia, unspecified: Secondary | ICD-10-CM | POA: Diagnosis present

## 2020-12-16 DIAGNOSIS — Z79899 Other long term (current) drug therapy: Secondary | ICD-10-CM | POA: Insufficient documentation

## 2020-12-16 DIAGNOSIS — E538 Deficiency of other specified B group vitamins: Secondary | ICD-10-CM

## 2020-12-16 DIAGNOSIS — D500 Iron deficiency anemia secondary to blood loss (chronic): Secondary | ICD-10-CM

## 2020-12-16 LAB — CBC WITH DIFFERENTIAL/PLATELET
Abs Immature Granulocytes: 0.02 10*3/uL (ref 0.00–0.07)
Basophils Absolute: 0 10*3/uL (ref 0.0–0.1)
Basophils Relative: 0 %
Eosinophils Absolute: 0.1 10*3/uL (ref 0.0–0.5)
Eosinophils Relative: 3 %
HCT: 35.4 % — ABNORMAL LOW (ref 36.0–46.0)
Hemoglobin: 11.1 g/dL — ABNORMAL LOW (ref 12.0–15.0)
Immature Granulocytes: 0 %
Lymphocytes Relative: 24 %
Lymphs Abs: 1.1 10*3/uL (ref 0.7–4.0)
MCH: 32.7 pg (ref 26.0–34.0)
MCHC: 31.4 g/dL (ref 30.0–36.0)
MCV: 104.4 fL — ABNORMAL HIGH (ref 80.0–100.0)
Monocytes Absolute: 0.4 10*3/uL (ref 0.1–1.0)
Monocytes Relative: 9 %
Neutro Abs: 2.9 10*3/uL (ref 1.7–7.7)
Neutrophils Relative %: 64 %
Platelets: 196 10*3/uL (ref 150–400)
RBC: 3.39 MIL/uL — ABNORMAL LOW (ref 3.87–5.11)
RDW: 13.4 % (ref 11.5–15.5)
WBC: 4.6 10*3/uL (ref 4.0–10.5)
nRBC: 0 % (ref 0.0–0.2)

## 2020-12-16 LAB — IRON AND TIBC
Iron: 43 ug/dL (ref 28–170)
Saturation Ratios: 12 % (ref 10.4–31.8)
TIBC: 360 ug/dL (ref 250–450)
UIBC: 317 ug/dL

## 2020-12-16 LAB — FERRITIN: Ferritin: 70 ng/mL (ref 11–307)

## 2020-12-20 ENCOUNTER — Ambulatory Visit: Payer: Medicare Other

## 2020-12-20 ENCOUNTER — Ambulatory Visit: Payer: Medicare Other | Admitting: Hematology and Oncology

## 2020-12-21 NOTE — Progress Notes (Incomplete)
[facility]  [address] [place],  [address] Phone: [phone]  Fax: [phone]   Clinic Day:  12/21/2020  Referring physician: [name], MD  Chief Complaint: Jocelyn Case is a 75 y.o. female with s/p partial gastrectomywith B12 deficiency andiron deficiency who is seen for 6 month assessment.  HPI: The patient was last seen in the hematology clinic on 07/05/2020. At that time, she felt "ok". She was currently being treated for a UTI.  Restless legs had improved. She denied ice pica and bleeding of any kind. She ate regular small amounts of food. She had not vomited since her surgery.  Exam was unremarkable. Hematocrit was 36.2, hemoglobin 11.2, platelets 291,000, WBC 5,300. Ferritin was 147. Sed rate was 20. She received a vitamin B12 injection.  She received vitamin B12 injections monthly x 4 (07/30/2020 - 11/22/2020).  The patient saw Dr. Bonna Gains on 08/18/2020. Discussed discontinuing Protonix. The patient said she would after she came back from a Thanksgiving vacation. Colonoscopy was scheduled for 11/17/2020 (cancelled).  Labs followed: 09/27/2020: Hematocrit 35.4, hemoglobin 10.9, MCV 104.1, platelets 221,000, WBC 3,800. Ferritin 86. Vitamin B12 879. 12/16/2020: Hematocrit 35.4, hemoglobin 11.1, MCV 104.4, platelets 196,000, WBC 4,600. Ferritin 70. Iron saturation 12%. TIBC 360.  During the interim, ***   Past Medical History:  Diagnosis Date  . Anemia   . Blood transfusion without reported diagnosis   . Complication of anesthesia   . CPAP (continuous positive airway pressure) dependence   . GERD (gastroesophageal reflux disease)   . PONV (postoperative nausea and vomiting)   . Restless leg syndrome 2015   R leg   . Sleep apnea     Past Surgical History:  Procedure Laterality Date  . ABDOMINAL HYSTERECTOMY    . APPENDECTOMY    . CHOLECYSTECTOMY    . COLONOSCOPY  2 years ago  . 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 ESOPHAGOGASTRODUODENOSCOPY (EGD) WITH PROPOFOL N/A 02/14/2018   Procedure: ESOPHAGOGASTRODUODENOSCOPY (EGD) WITH PROPOFOL;  Surgeon: Virgel Manifold, MD;  Location: [facility] ENDOSCOPY;  Service: Endoscopy;  Laterality: N/A;  . ESOPHAGOGASTRODUODENOSCOPY (EGD) WITH PROPOFOL N/A 02/15/2018   Procedure: ESOPHAGOGASTRODUODENOSCOPY (EGD) WITH PROPOFOL;  Surgeon: Virgel Manifold, MD;  Location: [facility] ENDOSCOPY;  Service: Endoscopy;  Laterality: N/A;  . ESOPHAGOGASTRODUODENOSCOPY (EGD) WITH PROPOFOL N/A 07/05/2018   Procedure: ESOPHAGOGASTRODUODENOSCOPY (EGD) WITH PROPOFOL;  Surgeon: Virgel Manifold, MD;  Location: [facility] ENDOSCOPY;  Service: Endoscopy;  Laterality: N/A;  . STOMACH SURGERY  2013  . UPPER GASTROINTESTINAL ENDOSCOPY  05/2017   pt stated "bleeding ulcer and stopped up duodenum was noted"    Family History  Problem Relation Age of Onset  . Peptic Ulcer Disease Mother   . Dementia Mother   . COPD Father   . Colon cancer Father     Social History:  reports that she quit smoking about 46 years ago. She has never used smokeless tobacco. She reports previous alcohol use. She reports that she does not use drugs.  She drinks wine in the evening. She previously lived in [place]. She moved to [place] in 05/2017. Her daughter lives in [place]. She is temporarily living in an apartment in [place].She has a Therapist, occupational.The patient is alone*** today.  Allergies:  Allergies  Allergen Reactions  . Morphine Nausea And Vomiting    Current Medications: Current Outpatient Medications  Medication Sig Dispense Refill  . acetaminophen (TYLENOL)  500 MG tablet Take 1 tablet by mouth in the morning and at bedtime.    . cephALEXin (KEFLEX) 250 MG capsule Take 250 mg by mouth daily.    . Cholecalciferol (VITAMIN D3) 50 MCG (2000 UT) TABS Take 2,000 Units by mouth daily.    . cyanocobalamin (,VITAMIN  B-12,) 1000 MCG/ML injection Inject 1,000 mcg into the muscle every 30 (thirty) days.    Mariane Baumgarten Sodium (DSS) 100 MG CAPS Take 1 tablet by mouth at bedtime as needed.  (Patient not taking: Reported on 08/18/2020)    . mirtazapine (REMERON) 15 MG tablet Take 15 mg by mouth at bedtime.     . Multiple Vitamin (MULTI-VITAMIN) tablet Take 1 tablet by mouth in the morning and at bedtime.    . Na Sulfate-K Sulfate-Mg Sulf 17.5-3.13-1.6 GM/177ML SOLN At 5 PM the day before procedure take 1 bottle and 5 hours before procedure take 1 bottle. 354 mL 0  . pantoprazole (PROTONIX) 40 MG tablet Take 1 tablet (40 mg total) by mouth daily. 90 tablet 0  . rOPINIRole (REQUIP) 0.25 MG tablet Take 0.25 mg by mouth at bedtime.      No current facility-administered medications for this visit.    Review of Systems  Constitutional: Positive for weight loss (5 lbs). Negative for chills, diaphoresis, fever and malaise/fatigue.  HENT: Positive for hearing loss (hearing aids). Negative for congestion, ear discharge, ear pain, nosebleeds, sinus pain, sore throat and tinnitus.   Eyes: Negative.  Negative for blurred vision, photophobia and pain.  Respiratory: Negative.  Negative for cough, hemoptysis, sputum production and shortness of breath.   Cardiovascular: Negative.  Negative for chest pain, palpitations and leg swelling.  Gastrointestinal: Negative for abdominal pain, blood in stool, constipation, diarrhea, heartburn, melena, nausea and vomiting.       Denies ice pica. Eats small amounts of food.  Genitourinary: Positive for frequency and urgency. Negative for dysuria (burning) and hematuria.       Ongoing UTI, on Keflex  Musculoskeletal: Negative.  Negative for back pain, joint pain, myalgias and neck pain.  Skin: Negative.  Negative for itching and rash.  Neurological: Negative for dizziness, tingling, sensory change, speech change, weakness and headaches.       Restless legs have improved.   Endo/Heme/Allergies: Negative.  Negative for environmental allergies. Does not bruise/bleed easily.  Psychiatric/Behavioral: Negative.  Negative for depression and memory loss. The patient is not nervous/anxious and does not have insomnia.   All other systems reviewed and are negative.  Performance status (ECOG): 0***  Vitals There were no vitals taken for this visit.   Physical Exam Vitals and nursing note reviewed.  Constitutional:      General: She is not in acute distress.    Appearance: She is well-developed. She is not diaphoretic.  HENT:     Head: Normocephalic and atraumatic.     Comments: Short blonde hair.    Mouth/Throat:     Mouth: Mucous membranes are moist.     Pharynx: Oropharynx is clear.  Eyes:     General: No scleral icterus.    Extraocular Movements: Extraocular movements intact.     Conjunctiva/sclera: Conjunctivae normal.     Pupils: Pupils are equal, round, and reactive to light.     Comments: Glasses. Blue eyes.  Neck:     Vascular: No JVD.  Cardiovascular:     Rate and Rhythm: Normal rate and regular rhythm.     Heart sounds: Normal heart sounds. No murmur heard.  Pulmonary:     Effort: Pulmonary effort is normal. No respiratory distress.     Breath sounds: Normal breath sounds. No wheezing or rales.  Chest:  Breasts:     Right: No axillary adenopathy or supraclavicular adenopathy.     Left: No axillary adenopathy or supraclavicular adenopathy.    Abdominal:     General: Bowel sounds are normal. There is no distension.     Palpations: Abdomen is soft. There is no hepatomegaly, splenomegaly or mass.     Tenderness: There is no abdominal tenderness. There is no guarding or rebound.  Musculoskeletal:        General: Normal range of motion.     Cervical back: Normal range of motion and neck supple.  Lymphadenopathy:     Head:     Right side of head: No preauricular, posterior auricular or occipital adenopathy.     Left side of head: No  preauricular, posterior auricular or occipital adenopathy.     Cervical: No cervical adenopathy.     Upper Body:     Right upper body: No supraclavicular or axillary adenopathy.     Left upper body: No supraclavicular or axillary adenopathy.     Lower Body: No right inguinal adenopathy. No left inguinal adenopathy.  Skin:    General: Skin is warm and dry.     Coloration: Skin is not pale.     Findings: No erythema or rash.  Neurological:     Mental Status: She is alert and oriented to person, place, and time.  Psychiatric:        Behavior: Behavior normal.        Thought Content: Thought content normal.        Judgment: Judgment normal.    No visits with results within 3 Day(s) from this visit.  Latest known visit with results is:  Appointment on 12/16/2020  Component Date Value Ref Range Status  . Iron 12/16/2020 43  28 - 170 ug/dL Final  . TIBC 12/16/2020 360  250 - 450 ug/dL Final  . Saturation Ratios 12/16/2020 12  10.4 - 31.8 % Final  . UIBC 12/16/2020 317  ug/dL Final   Performed at [facility], [address], [place], [place] [address]  . Ferritin 12/16/2020 70  11 - 307 ng/mL Final   Performed at [facility], [address] [place], [place] [address]  . WBC 12/16/2020 4.6  4.0 - 10.5 K/uL Final  . RBC 12/16/2020 3.39* 3.87 - 5.11 MIL/uL Final  . Hemoglobin 12/16/2020 11.1* 12.0 - 15.0 g/dL Final  . HCT 12/16/2020 35.4* 36.0 - 46.0 % Final  . MCV 12/16/2020 104.4* 80.0 - 100.0 fL Final  . MCH 12/16/2020 32.7  26.0 - 34.0 pg Final  . MCHC 12/16/2020 31.4  30.0 - 36.0 g/dL Final  . RDW 12/16/2020 13.4  11.5 - 15.5 % Final  . Platelets 12/16/2020 196  150 - 400 K/uL Final  . nRBC 12/16/2020 0.0  0.0 - 0.2 % Final  . Neutrophils Relative % 12/16/2020 64  % Final  . Neutro Abs 12/16/2020 2.9  1.7 - 7.7 K/uL Final  . Lymphocytes Relative 12/16/2020 24  % Final  . Lymphs Abs 12/16/2020 1.1  0.7 - 4.0 K/uL Final  . Monocytes Relative 12/16/2020 9  %  Final  . Monocytes Absolute 12/16/2020 0.4  0.1 - 1.0 K/uL Final  . Eosinophils Relative 12/16/2020 3  % Final  . Eosinophils Absolute 12/16/2020 0.1  0.0 - 0.5  K/uL Final  . Basophils Relative 12/16/2020 0  % Final  . Basophils Absolute 12/16/2020 0.0  0.0 - 0.1 K/uL Final  . Immature Granulocytes 12/16/2020 0  % Final  . Abs Immature Granulocytes 12/16/2020 0.02  0.00 - 0.07 K/uL Final   Performed at [facility], [address] [place], [place] [address]    Assessment:  Jocelyn Case is a 75 y.o. 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  She has iron deficiency anemia. She previously received IV iron off/on every 6 months for the past 3 years while living in [place]. She is intolerant of oral iron.  Work-up on 02/22/2019revealed a hematocrit of 31, hemoglobin 9.6, MCV 84.7, platelets 379,000, WBC 5600 with an ANC of 3200. Ferritin was 7. B12 was 267. Folate was 28.  She receivedVenoferweekly x 4 (11/30/2017 - 12/21/2017), x 2 (04/16/2018 - 04/23/2018), x 2 (03/27/2019 - 04/03/2019), 06/24/2019, x 2 (01/12/2020 - 01/19/2020) and 03/15/2020.  Ferritinhas been followed: 5 on 11/05/2017, 7 on 11/23/2017, 71 on 01/04/2018, 45 on 02/15/2018, 12 on 04/08/2018, 130 on 05/21/2018, 132 on 07/10/2018, 54 on 10/17/2018,44 on 10/31/2018, 11 on 03/26/2019, 32 on 06/19/2019, 43 on 09/10/2019, 69 on 11/07/2019, 52 on 01/09/2020, 304 on 05/10/2020, and 147 on 07/01/2020.  She receives B12IM monthly (last  06/08/2020). She is on folic acid. Folate was 20.9on 05/10/2020.  EGDon 02/15/2018 revealed non-bleeding gastric ulcers at the anastomosis, with no stigmata of bleeding. Biopsied. A Billroth II anastomosis characterized by ulceration and mild stenosis was dilated. Jejunal polyp(s) were biopsied. Pathology revealed no evidence of malignancy. EGDon 07/05/2018 revealed non-bleeding gastric ulcer with no stigmata of  bleeding. 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 Duodenal stent was placed on 01/14/2020and removed on 11/29/2018.EGD on 02/20/2019 revealed gastrointestinal mucosa with chronic active inflammation, marked erosion and reactive epithelial changes.H pylori and CMV were negative.  EGD on 05/26/2019 removed he AXIOS stent. She underwent gastrojejunostomy revision w/o vagotomy and resection of Braun enteroenterostomy on 03/23/2020. Pathology revealed a benign epithelium-lining cyst and small bowel with ulcerated anastomosis and focal fibrous serosal adhesion.  Patient received the [place] COVID-19 vaccine. Her last shot was on 11/15/2019.   Symptomatically, ***  Plan: 1.   Review labs from 12/16/2020   2.Iron deficency anemia Hematocrit 36.2.  Hemoglobin 11.2. MCV 101.7. [phone]. No Venofer today. 3.B12 deficiency B12 today and monthly x6.  Folate was 20.9 on 05/10/2020. Check folate annually. 4.Weight loss Weight down 5 pounds.  Patient feeling much better since interval surgery.  Continue to monitor. 5. 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  6.   B12 today and monthly x 6. 7.   RTC in 3 months for labs (CBC, ferritin) and B12. 8.   RTC in 6 months for MD assessment, labs (CBC with diff, ferritin, iron studies-day before), B12 and +/- Venofer.  I discussed the assessment and treatment plan with the patient.  The patient was provided an opportunity to ask questions and all were answered.  The patient agreed with the plan and demonstrated an understanding of the instructions.  The patient was advised to call back if the symptoms worsen or if the condition fails to improve as anticipated.  I provided *** minutes of face-to-face time during this this encounter and > 50% was spent counseling as documented under my assessment and plan.  Lequita Asal, MD,  PhD    12/21/2020, 10:05 AM  I, Mirian Mo Tufford, am acting as Education administrator for [employer]. Mike Gip, MD, PhD.  I, Melissa C. Mike Gip, MD, have reviewed the above documentation for accuracy and completeness, and I agree with the above.

## 2020-12-22 ENCOUNTER — Inpatient Hospital Stay: Payer: Medicare Other

## 2020-12-22 ENCOUNTER — Inpatient Hospital Stay: Payer: Medicare Other | Admitting: Hematology and Oncology

## 2020-12-22 DIAGNOSIS — D7589 Other specified diseases of blood and blood-forming organs: Secondary | ICD-10-CM | POA: Insufficient documentation

## 2020-12-29 NOTE — Progress Notes (Signed)
[facility]  [address] [place], [place] [address] Phone: [phone]  Fax: [phone]   Clinic Day:  12/30/2020  Referring physician: [name], MD  Chief Complaint: Jocelyn Case is a 75 y.o. female with s/p partial gastrectomywith B12 deficiency andiron deficiency who is seen for 6 month assessment.  HPI: The patient was last seen in the hematology clinic on 07/05/2020. At that time, she felt "ok". She was currently being treated for a UTI.  Restless legs had improved. She denied ice pica and bleeding of any kind. She ate regular small amounts of food. She had not vomited since her surgery.  Exam was unremarkable. Hematocrit was 36.2, hemoglobin 11.2, platelets 291,000, WBC 5,300. Ferritin was 147. Sed rate was 20.   She received vitamin B12 injections monthly x 5 (07/05/2020 - 11/22/2020).  The patient saw Dr. Bonna Gains on 08/18/2020. They discussed discontinuing Protonix. The patient said she would after she came back from a Thanksgiving vacation. Colonoscopy was scheduled for 11/17/2020 (cancelled).  Labs followed: 09/27/2020: Hematocrit 35.4, hemoglobin 10.9, MCV 104.1, platelets 221,000, WBC 3,800. Ferritin 86. Vitamin B12 879. 12/16/2020: Hematocrit 35.4, hemoglobin 11.1, MCV 104.4, platelets 196,000, WBC 4,600. Ferritin 70. Iron saturation 12%. TIBC 360.  During the interim, she has been "okay." She had an episode of nausea and vomiting last night but does not know what caused it. She eats small portions.She denies restless legs, ice pica, and any other cravings. Her fingernails have become wrinkly.  She has not had any trouble with UTIs. Her urinary urgency and frequency have resolved.  Past Medical History:  Diagnosis Date  . Anemia   . Blood transfusion without reported diagnosis   . Complication of anesthesia   . CPAP (continuous positive airway pressure) dependence   . GERD (gastroesophageal reflux disease)   . PONV  (postoperative nausea and vomiting)   . Restless leg syndrome 2015   R leg   . Sleep apnea     Past Surgical History:  Procedure Laterality Date  . ABDOMINAL HYSTERECTOMY    . APPENDECTOMY    . CHOLECYSTECTOMY    . COLONOSCOPY  2 years ago  . 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 ESOPHAGOGASTRODUODENOSCOPY (EGD) WITH PROPOFOL N/A 02/14/2018   Procedure: ESOPHAGOGASTRODUODENOSCOPY (EGD) WITH PROPOFOL;  Surgeon: Virgel Manifold, MD;  Location: [facility] ENDOSCOPY;  Service: Endoscopy;  Laterality: N/A;  . ESOPHAGOGASTRODUODENOSCOPY (EGD) WITH PROPOFOL N/A 02/15/2018   Procedure: ESOPHAGOGASTRODUODENOSCOPY (EGD) WITH PROPOFOL;  Surgeon: Virgel Manifold, MD;  Location: [facility] ENDOSCOPY;  Service: Endoscopy;  Laterality: N/A;  . ESOPHAGOGASTRODUODENOSCOPY (EGD) WITH PROPOFOL N/A 07/05/2018   Procedure: ESOPHAGOGASTRODUODENOSCOPY (EGD) WITH PROPOFOL;  Surgeon: Virgel Manifold, MD;  Location: [facility] ENDOSCOPY;  Service: Endoscopy;  Laterality: N/A;  . STOMACH SURGERY  2013  . UPPER GASTROINTESTINAL ENDOSCOPY  05/2017   pt stated "bleeding ulcer and stopped up duodenum was noted"    Family History  Problem Relation Age of Onset  . Peptic Ulcer Disease Mother   . Dementia Mother   . COPD Father   . Colon cancer Father     Social History:  reports that she quit smoking about 46 years ago. She has never used smokeless tobacco. She reports previous alcohol use. She reports that she does not use drugs.  She drinks wine in the evening. She previously lived in [place]. She moved to [place] in 05/2017. Her daughter lives in  [place]. She is temporarily living in an apartment in [place].She has a Therapist, occupational.The patient is alone today.  Allergies:  Allergies  Allergen Reactions  . Morphine Nausea And Vomiting    Current Medications: Current Outpatient Medications  Medication Sig Dispense Refill  .  acetaminophen (TYLENOL) 500 MG tablet Take 1 tablet by mouth in the morning and at bedtime.    . Cholecalciferol (VITAMIN D3) 50 MCG (2000 UT) TABS Take 2,000 Units by mouth daily.    . cyanocobalamin (,VITAMIN B-12,) 1000 MCG/ML injection Inject 1,000 mcg into the muscle every 30 (thirty) days.    . Multiple Vitamin (MULTI-VITAMIN) tablet Take 1 tablet by mouth in the morning and at bedtime.    . pantoprazole (PROTONIX) 40 MG tablet Take 1 tablet (40 mg total) by mouth daily. 90 tablet 0  . cephALEXin (KEFLEX) 250 MG capsule Take 250 mg by mouth daily. (Patient not taking: Reported on 12/30/2020)    . Docusate Sodium (DSS) 100 MG CAPS Take 1 tablet by mouth at bedtime as needed.  (Patient not taking: No sig reported)     No current facility-administered medications for this visit.    Review of Systems  Constitutional: Negative for chills, diaphoresis, fever, malaise/fatigue and weight loss (up 2 lbs).       Feels "okay."  HENT: Positive for hearing loss (hearing aids). Negative for congestion, ear discharge, ear pain, nosebleeds, sinus pain, sore throat and tinnitus.   Eyes: Negative.  Negative for blurred vision, photophobia and pain.  Respiratory: Negative.  Negative for cough, hemoptysis, sputum production and shortness of breath.   Cardiovascular: Negative.  Negative for chest pain, palpitations and leg swelling.  Gastrointestinal: Positive for nausea (last night) and vomiting (last night). Negative for abdominal pain, blood in stool, constipation, diarrhea, heartburn and melena.       Denies ice pica. Eats small amounts of food.  Genitourinary: Negative.  Negative for dysuria, frequency, hematuria and urgency.  Musculoskeletal: Negative.  Negative for back pain, joint pain, myalgias and neck pain.  Skin: Negative for itching and rash.       Wrinkly fingernails.  Neurological: Negative for dizziness, tingling, sensory change, speech change, weakness and headaches.       No restless legs.   Endo/Heme/Allergies: Negative.  Negative for environmental allergies. Does not bruise/bleed easily.  Psychiatric/Behavioral: Negative.  Negative for depression and memory loss. The patient is not nervous/anxious and does not have insomnia.   All other systems reviewed and are negative.  Performance status (ECOG): 0-1  Vitals Blood pressure 100/71, pulse 75, temperature 97.8 F (36.6 C), temperature source Oral, resp. rate 16, weight 118 lb 4.4 oz (53.7 kg), SpO2 99 %.   Physical Exam Vitals and nursing note reviewed.  Constitutional:      General: She is not in acute distress.    Appearance: She is well-developed. She is not diaphoretic.  HENT:     Head: Normocephalic and atraumatic.     Comments: Short blonde hair.    Mouth/Throat:     Mouth: Mucous membranes are moist.     Pharynx: Oropharynx is clear.  Eyes:     General: No scleral icterus.    Extraocular Movements: Extraocular movements intact.     Conjunctiva/sclera: Conjunctivae normal.     Pupils: Pupils are equal, round, and reactive to light.     Comments: Glasses. Blue eyes.  Neck:     Vascular: No JVD.  Cardiovascular:     Rate and Rhythm: Normal rate  and regular rhythm.     Heart sounds: Normal heart sounds. No murmur heard.   Pulmonary:     Effort: Pulmonary effort is normal. No respiratory distress.     Breath sounds: Normal breath sounds. No wheezing or rales.  Chest:  Breasts:     Right: No axillary adenopathy or supraclavicular adenopathy.     Left: No axillary adenopathy or supraclavicular adenopathy.    Abdominal:     General: Bowel sounds are normal. There is no distension.     Palpations: Abdomen is soft. There is no hepatomegaly, splenomegaly or mass.     Tenderness: There is no abdominal tenderness. There is no guarding or rebound.  Musculoskeletal:        General: Normal range of motion.     Cervical back: Normal range of motion and neck supple.  Lymphadenopathy:     Head:     Right side  of head: No preauricular, posterior auricular or occipital adenopathy.     Left side of head: No preauricular, posterior auricular or occipital adenopathy.     Cervical: No cervical adenopathy.     Upper Body:     Right upper body: No supraclavicular or axillary adenopathy.     Left upper body: No supraclavicular or axillary adenopathy.     Lower Body: No right inguinal adenopathy. No left inguinal adenopathy.  Skin:    General: Skin is warm and dry.     Coloration: Skin is not pale.     Findings: No erythema or rash.     Comments: Fingernail ridging.  Neurological:     Mental Status: She is alert and oriented to person, place, and time.  Psychiatric:        Behavior: Behavior normal.        Thought Content: Thought content normal.        Judgment: Judgment normal.    No visits with results within 3 Day(s) from this visit.  Latest known visit with results is:  Appointment on 12/16/2020  Component Date Value Ref Range Status  . Iron 12/16/2020 43  28 - 170 ug/dL Final  . TIBC 12/16/2020 360  250 - 450 ug/dL Final  . Saturation Ratios 12/16/2020 12  10.4 - 31.8 % Final  . UIBC 12/16/2020 317  ug/dL Final   Performed at [facility], [address] [place], [place] [address]  . Ferritin 12/16/2020 70  11 - 307 ng/mL Final   Performed at [facility], [address] [place], [place] [address]  . WBC 12/16/2020 4.6  4.0 - 10.5 K/uL Final  . RBC 12/16/2020 3.39* 3.87 - 5.11 MIL/uL Final  . Hemoglobin 12/16/2020 11.1* 12.0 - 15.0 g/dL Final  . HCT 12/16/2020 35.4* 36.0 - 46.0 % Final  . MCV 12/16/2020 104.4* 80.0 - 100.0 fL Final  . MCH 12/16/2020 32.7  26.0 - 34.0 pg Final  . MCHC 12/16/2020 31.4  30.0 - 36.0 g/dL Final  . RDW 12/16/2020 13.4  11.5 - 15.5 % Final  . Platelets 12/16/2020 196  150 - 400 K/uL Final  . nRBC 12/16/2020 0.0  0.0 - 0.2 % Final  . Neutrophils Relative % 12/16/2020 64  % Final  . Neutro Abs 12/16/2020 2.9  1.7 - 7.7 K/uL Final  .  Lymphocytes Relative 12/16/2020 24  % Final  . Lymphs Abs 12/16/2020 1.1  0.7 - 4.0 K/uL Final  . Monocytes Relative 12/16/2020 9  % Final  . Monocytes Absolute 12/16/2020 0.4  0.1 - 1.0 K/uL Final  . Eosinophils Relative 12/16/2020 3  % Final  . Eosinophils Absolute 12/16/2020 0.1  0.0 - 0.5 K/uL Final  . Basophils Relative 12/16/2020 0  % Final  . Basophils Absolute 12/16/2020 0.0  0.0 - 0.1 K/uL Final  . Immature Granulocytes 12/16/2020 0  % Final  . Abs Immature Granulocytes 12/16/2020 0.02  0.00 - 0.07 K/uL Final   Performed at [facility], [address], [place], [place] [address]    Assessment:  Jocelyn Case is a 75 y.o. 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  She has iron deficiency anemia. She previously received IV iron off/on every 6 months for the past 3 years while living in [place]. She is intolerant of oral iron.  Work-up on 02/22/2019revealed a hematocrit of 31, hemoglobin 9.6, MCV 84.7, platelets 379,000, WBC 5600 with an ANC of 3200. Ferritin was 7. B12 was 267. Folate was 28.  She receivedVenoferweekly x 4 (11/30/2017 - 12/21/2017), x 2 (04/16/2018 - 04/23/2018), x 2 (03/27/2019 - 04/03/2019), 06/24/2019, x 2 (01/12/2020 - 01/19/2020) and 03/15/2020.  Ferritinhas been followed: 5 on 11/05/2017, 7 on 11/23/2017, 71 on 01/04/2018, 45 on 02/15/2018, 12 on 04/08/2018, 130 on 05/21/2018, 132 on 07/10/2018, 54 on 10/17/2018,44 on 10/31/2018, 11 on 03/26/2019, 32 on 06/19/2019, 43 on 09/10/2019, 69 on 11/07/2019, 52 on 01/09/2020, 304 on 05/10/2020, 147 on 07/01/2020, 86 on 09/27/2020, and 70 on 12/16/2020.  She receives B12IM monthly (last  11/22/2020). She is on folic acid. Folate was 20.9on 05/10/2020.  EGDon 02/15/2018 revealed non-bleeding gastric ulcers at the anastomosis, with no stigmata of bleeding. Biopsied. A Billroth II anastomosis characterized by ulceration and mild  stenosis was dilated. Jejunal polyp(s) were biopsied. Pathology revealed no evidence of malignancy. EGDon 07/05/2018 revealed non-bleeding gastric ulcer with no stigmata of bleeding. 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 Duodenal stent was placed on 01/14/2020and removed on 11/29/2018.EGD on 02/20/2019 revealed gastrointestinal mucosa with chronic active inflammation, marked erosion and reactive epithelial changes.H pylori and CMV were negative.  EGD on 05/26/2019 removed he AXIOS stent. She underwent gastrojejunostomy revision w/o vagotomy and resection of Braun enteroenterostomy on 03/23/2020. Pathology revealed a benign epithelium-lining cyst and small bowel with ulcerated anastomosis and focal fibrous serosal adhesion.  Patient received the [place] COVID-19 vaccine. Her last immunization was on 11/15/2019.   Symptomatically, she feels "okay." She eats small portions. She denies restless legs, ice pica, and any other cravings. Exam is stable.  Plan: 1.   Review labs from 12/16/2020. 2.   Labs today: CBC, B12, folate, TSH, free T4, retic. 3.   Peripheral smear for review. 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  Hematocrit 36.6.  Hemoglobin 11.7. MCV 103.1 today. Ferritin70 with an iron saturation of 12% and a TIBC 360 on 12/16/2020. No Venofer today. 5.B12 deficiency B12 today and monthly x 6.  Folate was 20.9 on 05/10/2020. Check folate annually. 5.Macrocytic anemia  Etiology is unclear secondary to B12 correction.  Additional labs today.  If work-up negative, she may have an underlying myelodysplastic syndrome. 6.   RTC monthly x 6 for B12. 7.   RTC in 3 months for labs (CBC, ferritin, iron studies-day before) and B12.  I discussed the assessment and treatment plan with the patient.  The patient was provided an opportunity to ask questions and all were answered.  The patient  agreed with the plan and demonstrated an understanding  of the instructions.  The patient was advised to call back if the symptoms worsen or if the condition fails to improve as anticipated.  I provided 16 minutes of face-to-face time during this this encounter and > 50% was spent counseling as documented under my assessment and plan.  An additional 8 minutes were spent reviewing her chart (Epic and Care Everywhere) including notes, labs, and imaging studies.    Lequita Asal, MD, PhD    12/30/2020, 10:48 AM  I, Mirian Mo Tufford, am acting as Education administrator for [employer]. Mike Gip, MD, PhD.  I, Emila Steinhauser C. Mike Gip, MD, have reviewed the above documentation for accuracy and completeness, and I agree with the above.

## 2020-12-30 ENCOUNTER — Other Ambulatory Visit: Payer: Self-pay

## 2020-12-30 ENCOUNTER — Inpatient Hospital Stay: Payer: Medicare Other

## 2020-12-30 ENCOUNTER — Inpatient Hospital Stay (HOSPITAL_BASED_OUTPATIENT_CLINIC_OR_DEPARTMENT_OTHER): Payer: Medicare Other | Admitting: Hematology and Oncology

## 2020-12-30 ENCOUNTER — Encounter: Payer: Self-pay | Admitting: Hematology and Oncology

## 2020-12-30 VITALS — BP 100/71 | HR 75 | Temp 97.8°F | Resp 16 | Wt 118.3 lb

## 2020-12-30 DIAGNOSIS — E538 Deficiency of other specified B group vitamins: Secondary | ICD-10-CM

## 2020-12-30 DIAGNOSIS — D7589 Other specified diseases of blood and blood-forming organs: Secondary | ICD-10-CM | POA: Diagnosis not present

## 2020-12-30 DIAGNOSIS — D500 Iron deficiency anemia secondary to blood loss (chronic): Secondary | ICD-10-CM

## 2020-12-30 DIAGNOSIS — D509 Iron deficiency anemia, unspecified: Secondary | ICD-10-CM | POA: Diagnosis not present

## 2020-12-30 LAB — CBC
HCT: 36.6 % (ref 36.0–46.0)
Hemoglobin: 11.7 g/dL — ABNORMAL LOW (ref 12.0–15.0)
MCH: 33 pg (ref 26.0–34.0)
MCHC: 32 g/dL (ref 30.0–36.0)
MCV: 103.1 fL — ABNORMAL HIGH (ref 80.0–100.0)
Platelets: 240 10*3/uL (ref 150–400)
RBC: 3.55 MIL/uL — ABNORMAL LOW (ref 3.87–5.11)
RDW: 13.2 % (ref 11.5–15.5)
WBC: 4.6 10*3/uL (ref 4.0–10.5)
nRBC: 0 % (ref 0.0–0.2)

## 2020-12-30 LAB — T4, FREE: Free T4: 0.84 ng/dL (ref 0.61–1.12)

## 2020-12-30 LAB — FOLATE: Folate: 76 ng/mL (ref >5.9)

## 2020-12-30 LAB — RETICULOCYTES
Immature Retic Fract: 7.8 % (ref 2.3–15.9)
RBC.: 3.47 MIL/uL — ABNORMAL LOW (ref 3.87–5.11)
Retic Count, Absolute: 51 10*3/uL (ref 19.0–186.0)
Retic Ct Pct: 1.5 % (ref 0.4–3.1)

## 2020-12-30 LAB — PATHOLOGIST SMEAR REVIEW

## 2020-12-30 LAB — VITAMIN B12: Vitamin B-12: 491 pg/mL (ref 180–914)

## 2020-12-30 LAB — TSH: TSH: 0.502 u[IU]/mL (ref 0.350–4.500)

## 2020-12-30 MED ORDER — CYANOCOBALAMIN 1000 MCG/ML IJ SOLN
1000.0000 ug | Freq: Once | INTRAMUSCULAR | Status: AC
  Administered 2020-12-30: 1000 ug via INTRAMUSCULAR
  Filled 2020-12-30: qty 1

## 2020-12-30 NOTE — Progress Notes (Signed)
Patient tolerated Vitamin B 12 well, no concerns voiced. Labs drawn. Patient discharged. Stable.

## 2020-12-31 ENCOUNTER — Telehealth: Payer: Self-pay | Admitting: *Deleted

## 2020-12-31 NOTE — Telephone Encounter (Signed)
I contacted the patient. Patient is not taking any folic acid supplements.

## 2020-12-31 NOTE — Telephone Encounter (Signed)
-----   Message from Lequita Asal, MD sent at 12/30/2020  4:48 PM EDT ----- Regarding: Please call patient  Folate is high.  How much folic acid is she taking?  M  ----- Message ----- From: Buel Ream, Lab In [place] Sent: 12/30/2020  11:10 AM EDT To: Lequita Asal, MD

## 2020-12-31 NOTE — Telephone Encounter (Signed)
Per Dr. Loletha Grayer- this is most likely related to the patient's diet.

## 2021-01-27 ENCOUNTER — Inpatient Hospital Stay: Payer: Medicare Other | Attending: Oncology

## 2021-01-27 ENCOUNTER — Other Ambulatory Visit: Payer: Self-pay

## 2021-01-27 DIAGNOSIS — E538 Deficiency of other specified B group vitamins: Secondary | ICD-10-CM | POA: Insufficient documentation

## 2021-01-27 DIAGNOSIS — D500 Iron deficiency anemia secondary to blood loss (chronic): Secondary | ICD-10-CM

## 2021-01-27 MED ORDER — CYANOCOBALAMIN 1000 MCG/ML IJ SOLN
1000.0000 ug | Freq: Once | INTRAMUSCULAR | Status: AC
  Administered 2021-01-27: 1000 ug via INTRAMUSCULAR
  Filled 2021-01-27: qty 1

## 2021-01-31 ENCOUNTER — Inpatient Hospital Stay: Payer: Medicare Other

## 2021-02-24 ENCOUNTER — Inpatient Hospital Stay: Payer: Medicare Other | Attending: Oncology

## 2021-02-24 ENCOUNTER — Other Ambulatory Visit: Payer: Self-pay

## 2021-02-24 DIAGNOSIS — D500 Iron deficiency anemia secondary to blood loss (chronic): Secondary | ICD-10-CM

## 2021-02-24 DIAGNOSIS — E538 Deficiency of other specified B group vitamins: Secondary | ICD-10-CM | POA: Insufficient documentation

## 2021-02-24 MED ORDER — CYANOCOBALAMIN 1000 MCG/ML IJ SOLN
1000.0000 ug | Freq: Once | INTRAMUSCULAR | Status: AC
  Administered 2021-02-24: 1000 ug via INTRAMUSCULAR

## 2021-03-27 ENCOUNTER — Other Ambulatory Visit: Payer: Self-pay

## 2021-03-27 DIAGNOSIS — D500 Iron deficiency anemia secondary to blood loss (chronic): Secondary | ICD-10-CM

## 2021-03-27 DIAGNOSIS — D7589 Other specified diseases of blood and blood-forming organs: Secondary | ICD-10-CM

## 2021-03-27 DIAGNOSIS — E538 Deficiency of other specified B group vitamins: Secondary | ICD-10-CM

## 2021-03-29 ENCOUNTER — Inpatient Hospital Stay: Payer: Medicare Other

## 2021-03-30 ENCOUNTER — Other Ambulatory Visit: Payer: Self-pay

## 2021-03-30 ENCOUNTER — Inpatient Hospital Stay: Payer: Medicare Other | Attending: Oncology

## 2021-03-30 DIAGNOSIS — D7589 Other specified diseases of blood and blood-forming organs: Secondary | ICD-10-CM

## 2021-03-30 DIAGNOSIS — E538 Deficiency of other specified B group vitamins: Secondary | ICD-10-CM | POA: Insufficient documentation

## 2021-03-30 DIAGNOSIS — D539 Nutritional anemia, unspecified: Secondary | ICD-10-CM | POA: Diagnosis not present

## 2021-03-30 DIAGNOSIS — Z903 Acquired absence of stomach [part of]: Secondary | ICD-10-CM | POA: Insufficient documentation

## 2021-03-30 DIAGNOSIS — Z9049 Acquired absence of other specified parts of digestive tract: Secondary | ICD-10-CM | POA: Insufficient documentation

## 2021-03-30 DIAGNOSIS — D500 Iron deficiency anemia secondary to blood loss (chronic): Secondary | ICD-10-CM | POA: Diagnosis not present

## 2021-03-30 LAB — CBC WITH DIFFERENTIAL/PLATELET
Abs Immature Granulocytes: 0.01 10*3/uL (ref 0.00–0.07)
Basophils Absolute: 0 10*3/uL (ref 0.0–0.1)
Basophils Relative: 1 %
Eosinophils Absolute: 0.2 10*3/uL (ref 0.0–0.5)
Eosinophils Relative: 6 %
HCT: 34.9 % — ABNORMAL LOW (ref 36.0–46.0)
Hemoglobin: 10.8 g/dL — ABNORMAL LOW (ref 12.0–15.0)
Immature Granulocytes: 0 %
Lymphocytes Relative: 26 %
Lymphs Abs: 1.1 10*3/uL (ref 0.7–4.0)
MCH: 32.4 pg (ref 26.0–34.0)
MCHC: 30.9 g/dL (ref 30.0–36.0)
MCV: 104.8 fL — ABNORMAL HIGH (ref 80.0–100.0)
Monocytes Absolute: 0.4 10*3/uL (ref 0.1–1.0)
Monocytes Relative: 9 %
Neutro Abs: 2.4 10*3/uL (ref 1.7–7.7)
Neutrophils Relative %: 58 %
Platelets: 221 10*3/uL (ref 150–400)
RBC: 3.33 MIL/uL — ABNORMAL LOW (ref 3.87–5.11)
RDW: 13.2 % (ref 11.5–15.5)
WBC: 4.1 10*3/uL (ref 4.0–10.5)
nRBC: 0 % (ref 0.0–0.2)

## 2021-03-30 LAB — IRON AND TIBC
Iron: 82 ug/dL (ref 28–170)
Saturation Ratios: 21 % (ref 10.4–31.8)
TIBC: 385 ug/dL (ref 250–450)
UIBC: 303 ug/dL

## 2021-03-30 LAB — FERRITIN: Ferritin: 63 ng/mL (ref 11–307)

## 2021-03-31 ENCOUNTER — Inpatient Hospital Stay (HOSPITAL_BASED_OUTPATIENT_CLINIC_OR_DEPARTMENT_OTHER): Payer: Medicare Other | Admitting: Oncology

## 2021-03-31 ENCOUNTER — Inpatient Hospital Stay: Payer: Medicare Other

## 2021-03-31 ENCOUNTER — Encounter: Payer: Self-pay | Admitting: Oncology

## 2021-03-31 VITALS — BP 111/58 | HR 80 | Temp 98.4°F | Resp 14 | Wt 119.2 lb

## 2021-03-31 DIAGNOSIS — D7589 Other specified diseases of blood and blood-forming organs: Secondary | ICD-10-CM | POA: Diagnosis not present

## 2021-03-31 DIAGNOSIS — D500 Iron deficiency anemia secondary to blood loss (chronic): Secondary | ICD-10-CM | POA: Diagnosis not present

## 2021-03-31 DIAGNOSIS — E538 Deficiency of other specified B group vitamins: Secondary | ICD-10-CM | POA: Diagnosis not present

## 2021-03-31 MED ORDER — CYANOCOBALAMIN 1000 MCG/ML IJ SOLN
1000.0000 ug | Freq: Once | INTRAMUSCULAR | Status: AC
  Administered 2021-03-31: 1000 ug via INTRAMUSCULAR

## 2021-03-31 NOTE — Progress Notes (Signed)
[facility]  [address], [address] [place], [place] [address] Phone: [phone]  Fax: [phone]   Clinic Day:  03/31/2021  Referring physician: [name], MD  Chief Complaint: Jocelyn Case is a 75 y.o. female presents for follow up of B12 deficiency and iron deficiency, s/p partial gastrectomy in 2009  [place]  Patient previously followed up by Dr.Corcoran, patient switched care to me on 03/31/21 Extensive medical record review was performed by me  2008 history including partial gastrectomy and reported Bilroth II reconstruction (unclear if vagotomy was performed). On EGD she also has an additional small bowel anastomosis. 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  Her symptoms were worsening since removal of the stent and had daily emesis and decreased oral intake. 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  Today patient reports feeling well.  She takes multivitamin.  No new complaints.  Past Medical History:  Diagnosis Date   Anemia    Blood transfusion without reported diagnosis    Complication of anesthesia    CPAP (continuous positive airway pressure) dependence    GERD (gastroesophageal reflux disease)    PONV (postoperative nausea and vomiting)    Restless leg syndrome 2015   R leg    Sleep apnea     Past Surgical History:  Procedure Laterality Date   ABDOMINAL HYSTERECTOMY     APPENDECTOMY     CHOLECYSTECTOMY     COLONOSCOPY  2 years ago   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   not digesting good, had to have work on duodenal per pt   ESOPHAGOGASTRODUODENOSCOPY (EGD) WITH PROPOFOL N/A 02/14/2018   Procedure: ESOPHAGOGASTRODUODENOSCOPY (EGD) WITH PROPOFOL;  Surgeon: Virgel Manifold, MD;  Location: [facility] ENDOSCOPY;  Service: Endoscopy;  Laterality: N/A;   ESOPHAGOGASTRODUODENOSCOPY (EGD) WITH PROPOFOL N/A 02/15/2018   Procedure: ESOPHAGOGASTRODUODENOSCOPY (EGD) WITH PROPOFOL;  Surgeon: Virgel Manifold, MD;  Location: [facility] ENDOSCOPY;  Service: Endoscopy;  Laterality: N/A;   ESOPHAGOGASTRODUODENOSCOPY (EGD) WITH PROPOFOL N/A 07/05/2018   Procedure: ESOPHAGOGASTRODUODENOSCOPY (EGD) WITH PROPOFOL;  Surgeon: Virgel Manifold, MD;  Location: [facility] ENDOSCOPY;  Service: Endoscopy;  Laterality: N/A;   STOMACH SURGERY  2013   UPPER GASTROINTESTINAL ENDOSCOPY  05/2017   pt stated "bleeding ulcer and stopped up duodenum was noted"    Family History  Problem Relation Age of Onset   Peptic Ulcer Disease Mother    Dementia Mother    COPD Father    Colon cancer Father     Social History:  reports that she quit smoking about 46 years ago. She has never used smokeless tobacco. She reports previous alcohol use. She reports that she does not use drugs.  She drinks wine in the evening.  She previously lived in [place]. She moved to [place] in 05/2017.  Her daughter lives in [place]. She is temporarily living in an apartment in [place]. She has a Therapist, occupational.The patient is alone today.  Allergies:  Allergies  Allergen Reactions   Solifenacin Rash   Morphine Nausea And Vomiting    Current Medications: Current Outpatient Medications  Medication Sig Dispense Refill   acetaminophen (TYLENOL) 500 MG tablet Take 1 tablet by mouth in the morning and at bedtime.     Cholecalciferol (VITAMIN D3) 50 MCG (2000 UT) TABS Take 2,000 Units by mouth daily.  cyanocobalamin (,VITAMIN B-12,) 1000 MCG/ML injection Inject 1,000 mcg into the muscle every 30 (thirty) days.     Multiple Vitamin (MULTI-VITAMIN) tablet Take 1 tablet by mouth in the morning and at bedtime.     pantoprazole (PROTONIX) 40 MG tablet Take 1 tablet (40 mg total) by mouth daily. 90 tablet 0    cephALEXin (KEFLEX) 250 MG capsule Take 250 mg by mouth daily. (Patient not taking: No sig reported)     ciprofloxacin (CIPRO) 500 MG tablet Take 500 mg by mouth 2 (two) times daily. (Patient not taking: Reported on 03/31/2021)     Docusate Sodium (DSS) 100 MG CAPS Take 1 tablet by mouth at bedtime as needed.  (Patient not taking: No sig reported)     solifenacin (VESICARE) 10 MG tablet Take 1 tablet by mouth daily. (Patient not taking: Reported on 03/31/2021)     triamcinolone cream (KENALOG) 0.1 %  (Patient not taking: Reported on 03/31/2021)     No current facility-administered medications for this visit.    Review of Systems  Constitutional: Negative for chills, diaphoresis, fever, malaise/fatigue and weight loss (up 2 lbs).       Feels "okay."  HENT: Positive for hearing loss (hearing aids). Negative for congestion, ear discharge, ear pain, nosebleeds, sinus pain, sore throat and tinnitus.   Eyes: Negative.  Negative for blurred vision, photophobia and pain.  Respiratory: Negative.  Negative for cough, hemoptysis, sputum production and shortness of breath.   Cardiovascular: Negative.  Negative for chest pain, palpitations and leg swelling.  Gastrointestinal: Positive for nausea (last night) and vomiting (last night). Negative for abdominal pain, blood in stool, constipation, diarrhea, heartburn and melena.       Denies ice pica. Eats small amounts of food.  Genitourinary: Negative.  Negative for dysuria, frequency, hematuria and urgency.  Musculoskeletal: Negative.  Negative for back pain, joint pain, myalgias and neck pain.  Skin: Negative for itching and rash.       Wrinkly fingernails.  Neurological: Negative for dizziness, tingling, sensory change, speech change, weakness and headaches.       No restless legs.  Endo/Heme/Allergies: Negative.  Negative for environmental allergies. Does not bruise/bleed easily.  Psychiatric/Behavioral: Negative.  Negative for depression and memory loss.  The patient is not nervous/anxious and does not have insomnia.   All other systems reviewed and are negative.  Performance status (ECOG): 0-1  Vitals Blood pressure (!) 111/58, pulse 80, temperature 98.4 F (36.9 C), resp. rate 14, weight 119 lb 2.5 oz (54.1 kg), SpO2 100 %.   Physical Exam Vitals and nursing note reviewed.  Constitutional:      General: She is not in acute distress.    Appearance: She is well-developed. She is not diaphoretic.  HENT:     Head: Normocephalic and atraumatic.     Comments: Short blonde hair.    Mouth/Throat:     Mouth: Mucous membranes are moist.     Pharynx: Oropharynx is clear.  Eyes:     General: No scleral icterus.    Extraocular Movements: Extraocular movements intact.     Conjunctiva/sclera: Conjunctivae normal.     Pupils: Pupils are equal, round, and reactive to light.     Comments: Glasses. Blue eyes.  Neck:     Vascular: No JVD.  Cardiovascular:     Rate and Rhythm: Normal rate and regular rhythm.     Heart sounds: Normal heart sounds. No murmur heard.   Pulmonary:     Effort: Pulmonary effort is  normal. No respiratory distress.     Breath sounds: Normal breath sounds. No wheezing or rales.  Chest:  Breasts:     Right: No axillary adenopathy or supraclavicular adenopathy.     Left: No axillary adenopathy or supraclavicular adenopathy.    Abdominal:     General: Bowel sounds are normal. There is no distension.     Palpations: Abdomen is soft. There is no hepatomegaly, splenomegaly or mass.     Tenderness: There is no abdominal tenderness. There is no guarding or rebound.  Musculoskeletal:        General: Normal range of motion.     Cervical back: Normal range of motion and neck supple.  Lymphadenopathy:     Head:     Right side of head: No preauricular, posterior auricular or occipital adenopathy.     Left side of head: No preauricular, posterior auricular or occipital adenopathy.     Cervical: No cervical adenopathy.      Upper Body:     Right upper body: No supraclavicular or axillary adenopathy.     Left upper body: No supraclavicular or axillary adenopathy.     Lower Body: No right inguinal adenopathy. No left inguinal adenopathy.  Skin:    General: Skin is warm and dry.     Coloration: Skin is not pale.     Findings: No erythema or rash.     Comments: Fingernail ridging.  Neurological:     Mental Status: She is alert and oriented to person, place, and time.  Psychiatric:        Behavior: Behavior normal.        Thought Content: Thought content normal.        Judgment: Judgment normal.    Appointment on 03/30/2021  Component Date Value Ref Range Status   Ferritin 03/30/2021 63  11 - 307 ng/mL Final   Performed at [facility], [address] [place], [place] [address]   Iron 03/30/2021 82  28 - 170 ug/dL Final   TIBC 03/30/2021 385  250 - 450 ug/dL Final   Saturation Ratios 03/30/2021 21  10.4 - 31.8 % Final   UIBC 03/30/2021 303  ug/dL Final   Performed at [facility], [address] [place], [place] [address]   WBC 03/30/2021 4.1  4.0 - 10.5 K/uL Final   RBC 03/30/2021 3.33 (A) 3.87 - 5.11 MIL/uL Final   Hemoglobin 03/30/2021 10.8 (A) 12.0 - 15.0 g/dL Final   HCT 03/30/2021 34.9 (A) 36.0 - 46.0 % Final   MCV 03/30/2021 104.8 (A) 80.0 - 100.0 fL Final   MCH 03/30/2021 32.4  26.0 - 34.0 pg Final   MCHC 03/30/2021 30.9  30.0 - 36.0 g/dL Final   RDW 03/30/2021 13.2  11.5 - 15.5 % Final   Platelets 03/30/2021 221  150 - 400 K/uL Final   nRBC 03/30/2021 0.0  0.0 - 0.2 % Final   Neutrophils Relative % 03/30/2021 58  % Final   Neutro Abs 03/30/2021 2.4  1.7 - 7.7 K/uL Final   Lymphocytes Relative 03/30/2021 26  % Final   Lymphs Abs 03/30/2021 1.1  0.7 - 4.0 K/uL Final   Monocytes Relative 03/30/2021 9  % Final   Monocytes Absolute 03/30/2021 0.4  0.1 - 1.0 K/uL Final   Eosinophils Relative 03/30/2021 6  % Final   Eosinophils Absolute 03/30/2021 0.2  0.0 - 0.5 K/uL Final    Basophils Relative 03/30/2021 1  % Final   Basophils Absolute 03/30/2021  0.0  0.0 - 0.1 K/uL Final   Immature Granulocytes 03/30/2021 0  % Final   Abs Immature Granulocytes 03/30/2021 0.01  0.00 - 0.07 K/uL Final   Performed at [facility], [address], [place], [place] [address]    Assessment and plan 1. Iron deficiency anemia due to chronic blood loss   2. Macrocytosis   3. B12 deficiency    #History of iron deficiency in the context of previous partial gastrectomy in 2009 followed by gastrojejunostomy on 03/23/2020.  Labs reviewed and discussed with patient. Hemoglobin is 10.8, slightly lower than 3 months ago. Iron panel showed ferritin of 63, iron saturation 21 this is improved from last visit. A hold of IV Venofer at this point.  #Vitamin B12 deficiency, Patient has been on monthly vitamin B12 injections. Plan B12 was checked in March 2022 and was normal at 491.  Continue monthly injections.  Check B12 at the next visit.  #Macrocytic anemia, etiology unclear Patient has normal folate, on B12 injections with normal B12 levels.  Normal thyroid function. Questionable underlying myelodysplastic syndrome.  Continue close monitor.  monthly vitamin B12 injection x2  RTC in 3 months for labs (CBC, ferritin, iron studies, B12-day before) and B12.  I discussed the assessment and treatment plan with the patient.  The patient was provided an opportunity to ask questions and all were answered.  The patient agreed with the plan and demonstrated an understanding of the instructions.  The patient was advised to call back if the symptoms worsen or if the condition fails to improve as anticipated.  Earlie Server, MD, PhD Hematology Oncology [place] at [facility] 03/31/2021

## 2021-03-31 NOTE — Progress Notes (Signed)
UTI: about two weeks ago; done with antibiotics. Concerned about her finger nails being cracky. Not painful.

## 2021-04-20 ENCOUNTER — Telehealth: Payer: Self-pay | Admitting: Gastroenterology

## 2021-04-20 NOTE — Telephone Encounter (Signed)
Wants a refill on pantoprazole (PROTONIX) 40 MG

## 2021-04-21 ENCOUNTER — Encounter: Payer: Self-pay | Admitting: Hematology and Oncology

## 2021-04-21 MED ORDER — PANTOPRAZOLE SODIUM 40 MG PO TBEC: 40.0000 mg | DELAYED_RELEASE_TABLET | Freq: Every day | ORAL | 0 refills | Status: DC

## 2021-04-22 ENCOUNTER — Telehealth: Payer: Self-pay | Admitting: Gastroenterology

## 2021-04-22 NOTE — Telephone Encounter (Signed)
pantoprazole (PROTONIX) 40 MG tablet  90 day refill  [facility] [place]

## 2021-04-28 ENCOUNTER — Inpatient Hospital Stay: Payer: Medicare Other | Attending: Oncology

## 2021-04-28 ENCOUNTER — Other Ambulatory Visit: Payer: Self-pay

## 2021-04-28 DIAGNOSIS — E538 Deficiency of other specified B group vitamins: Secondary | ICD-10-CM | POA: Diagnosis present

## 2021-04-28 DIAGNOSIS — D500 Iron deficiency anemia secondary to blood loss (chronic): Secondary | ICD-10-CM

## 2021-04-28 MED ORDER — CYANOCOBALAMIN 1000 MCG/ML IJ SOLN
1000.0000 ug | Freq: Once | INTRAMUSCULAR | Status: AC
  Administered 2021-04-28: 1000 ug via INTRAMUSCULAR
  Filled 2021-04-28: qty 1

## 2021-05-25 ENCOUNTER — Ambulatory Visit: Payer: Medicare Other | Admitting: Gastroenterology

## 2021-05-31 ENCOUNTER — Other Ambulatory Visit: Payer: Self-pay

## 2021-05-31 ENCOUNTER — Inpatient Hospital Stay: Payer: Medicare Other | Attending: Oncology

## 2021-05-31 DIAGNOSIS — E538 Deficiency of other specified B group vitamins: Secondary | ICD-10-CM | POA: Insufficient documentation

## 2021-05-31 DIAGNOSIS — D500 Iron deficiency anemia secondary to blood loss (chronic): Secondary | ICD-10-CM

## 2021-05-31 MED ORDER — CYANOCOBALAMIN 1000 MCG/ML IJ SOLN
1000.0000 ug | Freq: Once | INTRAMUSCULAR | Status: AC
  Administered 2021-05-31: 1000 ug via INTRAMUSCULAR

## 2021-06-07 ENCOUNTER — Encounter: Payer: Self-pay | Admitting: Hematology and Oncology

## 2021-06-30 ENCOUNTER — Inpatient Hospital Stay: Payer: Medicare Other

## 2021-07-01 ENCOUNTER — Other Ambulatory Visit: Payer: TRICARE For Life (TFL)

## 2021-07-04 ENCOUNTER — Other Ambulatory Visit: Payer: Self-pay | Admitting: *Deleted

## 2021-07-04 DIAGNOSIS — E538 Deficiency of other specified B group vitamins: Secondary | ICD-10-CM

## 2021-07-04 DIAGNOSIS — D500 Iron deficiency anemia secondary to blood loss (chronic): Secondary | ICD-10-CM

## 2021-07-05 ENCOUNTER — Inpatient Hospital Stay: Payer: Medicare Other | Attending: Oncology

## 2021-07-05 ENCOUNTER — Other Ambulatory Visit: Payer: TRICARE For Life (TFL)

## 2021-07-05 DIAGNOSIS — D500 Iron deficiency anemia secondary to blood loss (chronic): Secondary | ICD-10-CM | POA: Insufficient documentation

## 2021-07-05 DIAGNOSIS — E538 Deficiency of other specified B group vitamins: Secondary | ICD-10-CM | POA: Diagnosis not present

## 2021-07-05 LAB — CBC WITH DIFFERENTIAL/PLATELET
Abs Immature Granulocytes: 0.02 10*3/uL (ref 0.00–0.07)
Basophils Absolute: 0 10*3/uL (ref 0.0–0.1)
Basophils Relative: 1 %
Eosinophils Absolute: 0.1 10*3/uL (ref 0.0–0.5)
Eosinophils Relative: 3 %
HCT: 35.5 % — ABNORMAL LOW (ref 36.0–46.0)
Hemoglobin: 10.9 g/dL — ABNORMAL LOW (ref 12.0–15.0)
Immature Granulocytes: 1 %
Lymphocytes Relative: 30 %
Lymphs Abs: 1.2 10*3/uL (ref 0.7–4.0)
MCH: 32.4 pg (ref 26.0–34.0)
MCHC: 30.7 g/dL (ref 30.0–36.0)
MCV: 105.7 fL — ABNORMAL HIGH (ref 80.0–100.0)
Monocytes Absolute: 0.4 10*3/uL (ref 0.1–1.0)
Monocytes Relative: 10 %
Neutro Abs: 2.2 10*3/uL (ref 1.7–7.7)
Neutrophils Relative %: 55 %
Platelets: 208 10*3/uL (ref 150–400)
RBC: 3.36 MIL/uL — ABNORMAL LOW (ref 3.87–5.11)
RDW: 13.5 % (ref 11.5–15.5)
WBC: 3.9 10*3/uL — ABNORMAL LOW (ref 4.0–10.5)
nRBC: 0 % (ref 0.0–0.2)

## 2021-07-05 LAB — RETIC PANEL
Immature Retic Fract: 7.6 % (ref 2.3–15.9)
RBC.: 3.35 MIL/uL — ABNORMAL LOW (ref 3.87–5.11)
Retic Count, Absolute: 50.9 10*3/uL (ref 19.0–186.0)
Retic Ct Pct: 1.5 % (ref 0.4–3.1)
Reticulocyte Hemoglobin: 34.6 pg (ref >27.9)

## 2021-07-05 LAB — COMPREHENSIVE METABOLIC PANEL
ALT: 28 U/L (ref 0–44)
AST: 27 U/L (ref 15–41)
Albumin: 3.6 g/dL (ref 3.5–5.0)
Alkaline Phosphatase: 73 U/L (ref 38–126)
Anion gap: 9 (ref 5–15)
BUN: 33 mg/dL — ABNORMAL HIGH (ref 8–23)
CO2: 24 mmol/L (ref 22–32)
Calcium: 8.9 mg/dL (ref 8.9–10.3)
Chloride: 106 mmol/L (ref 98–111)
Creatinine, Ser: 0.66 mg/dL (ref 0.44–1.00)
GFR, Estimated: >60 mL/min (ref >60)
Glucose, Bld: 101 mg/dL — ABNORMAL HIGH (ref 70–99)
Potassium: 4.3 mmol/L (ref 3.5–5.1)
Sodium: 139 mmol/L (ref 135–145)
Total Bilirubin: 0.4 mg/dL (ref 0.3–1.2)
Total Protein: 6.6 g/dL (ref 6.5–8.1)

## 2021-07-05 LAB — IRON AND TIBC
Iron: 43 ug/dL (ref 28–170)
Saturation Ratios: 12 % (ref 10.4–31.8)
TIBC: 357 ug/dL (ref 250–450)
UIBC: 314 ug/dL

## 2021-07-05 LAB — VITAMIN B12: Vitamin B-12: 696 pg/mL (ref 180–914)

## 2021-07-05 LAB — FERRITIN: Ferritin: 36 ng/mL (ref 11–307)

## 2021-07-07 ENCOUNTER — Inpatient Hospital Stay: Payer: Medicare Other | Admitting: Oncology

## 2021-07-07 ENCOUNTER — Ambulatory Visit: Payer: TRICARE For Life (TFL) | Admitting: Oncology

## 2021-07-07 ENCOUNTER — Inpatient Hospital Stay: Payer: Medicare Other

## 2021-07-07 ENCOUNTER — Ambulatory Visit: Payer: TRICARE For Life (TFL)

## 2021-07-14 ENCOUNTER — Encounter: Payer: Self-pay | Admitting: Oncology

## 2021-07-14 ENCOUNTER — Inpatient Hospital Stay: Payer: Medicare Other

## 2021-07-14 ENCOUNTER — Inpatient Hospital Stay (HOSPITAL_BASED_OUTPATIENT_CLINIC_OR_DEPARTMENT_OTHER): Payer: Medicare Other | Admitting: Oncology

## 2021-07-14 ENCOUNTER — Ambulatory Visit: Payer: TRICARE For Life (TFL)

## 2021-07-14 ENCOUNTER — Ambulatory Visit: Payer: TRICARE For Life (TFL) | Admitting: Oncology

## 2021-07-14 ENCOUNTER — Other Ambulatory Visit: Payer: Self-pay

## 2021-07-14 VITALS — BP 119/55 | HR 78 | Temp 99.0°F | Wt 120.1 lb

## 2021-07-14 VITALS — BP 113/48 | HR 80 | Temp 97.0°F | Resp 19

## 2021-07-14 DIAGNOSIS — E538 Deficiency of other specified B group vitamins: Secondary | ICD-10-CM | POA: Diagnosis not present

## 2021-07-14 DIAGNOSIS — D500 Iron deficiency anemia secondary to blood loss (chronic): Secondary | ICD-10-CM

## 2021-07-14 MED ORDER — SODIUM CHLORIDE 0.9 % IV SOLN
Freq: Once | INTRAVENOUS | Status: AC
  Filled 2021-07-14: qty 250

## 2021-07-14 MED ORDER — SODIUM CHLORIDE 0.9 % IV SOLN: 200.0000 mg | Freq: Once | INTRAVENOUS | Status: DC

## 2021-07-14 MED ORDER — IRON SUCROSE 20 MG/ML IV SOLN
200.0000 mg | Freq: Once | INTRAVENOUS | Status: AC
  Administered 2021-07-14: 200 mg via INTRAVENOUS
  Filled 2021-07-14: qty 10

## 2021-07-14 MED ORDER — CYANOCOBALAMIN 1000 MCG/ML IJ SOLN
1000.0000 ug | Freq: Once | INTRAMUSCULAR | Status: AC
  Administered 2021-07-14: 1000 ug via INTRAMUSCULAR
  Filled 2021-07-14: qty 1

## 2021-07-14 NOTE — Progress Notes (Signed)
Hematology oncology   Clinic Day:  07/14/2021  Referring physician: [name], MD  Chief Complaint: Jocelyn Case is a 75 y.o. female presents for follow up of B12 deficiency and iron deficiency, s/p partial gastrectomy in 2009  [place]  Patient previously followed up by Dr.Corcoran, patient switched care to me on 03/31/21 Extensive medical record review was performed by me  2008 history including partial gastrectomy and reported Bilroth II reconstruction (unclear if vagotomy was performed). On EGD she also has an additional small bowel anastomosis. 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  Her symptoms were worsening since removal of the stent and had daily emesis and decreased oral intake. 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  INTERVAL HISTORY Jocelyn Case is a 75 y.o. female who has above history reviewed by me today presents for follow up visit for management of iron deficiency anemia, B12 deficiency. Problems and complaints are listed below:  Today patient reports feeling well.  She takes multivitamin.  No new complaints.  Past Medical History:  Diagnosis Date   Anemia    Blood transfusion without reported diagnosis    Complication of anesthesia    CPAP (continuous positive airway pressure) dependence    GERD (gastroesophageal reflux disease)    PONV (postoperative nausea and vomiting)    Restless leg syndrome 2015   R leg    Sleep apnea     Past Surgical History:  Procedure Laterality Date   ABDOMINAL HYSTERECTOMY     APPENDECTOMY     CHOLECYSTECTOMY     COLONOSCOPY  2 years ago   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   not digesting good, had to have work on duodenal per pt   ESOPHAGOGASTRODUODENOSCOPY (EGD)  WITH PROPOFOL N/A 02/14/2018   Procedure: ESOPHAGOGASTRODUODENOSCOPY (EGD) WITH PROPOFOL;  Surgeon: Virgel Manifold, MD;  Location: [facility] ENDOSCOPY;  Service: Endoscopy;  Laterality: N/A;   ESOPHAGOGASTRODUODENOSCOPY (EGD) WITH PROPOFOL N/A 02/15/2018   Procedure: ESOPHAGOGASTRODUODENOSCOPY (EGD) WITH PROPOFOL;  Surgeon: Virgel Manifold, MD;  Location: [facility];  Service: Endoscopy;  Laterality: N/A;   ESOPHAGOGASTRODUODENOSCOPY (EGD) WITH PROPOFOL N/A 07/05/2018   Procedure: ESOPHAGOGASTRODUODENOSCOPY (EGD) WITH PROPOFOL;  Surgeon: Virgel Manifold, MD;  Location: [facility];  Service: Endoscopy;  Laterality: N/A;   STOMACH SURGERY  2013   UPPER GASTROINTESTINAL ENDOSCOPY  05/2017   pt stated "bleeding ulcer and stopped up duodenum was noted"    Family History  Problem Relation Age of Onset   Peptic Ulcer Disease Mother    Dementia Mother    COPD Father    Colon cancer Father     Social History:  reports that she quit smoking about 46 years ago. She has never used smokeless tobacco. She reports that she does not currently use alcohol. She reports that she does not use drugs.  She drinks wine in the evening.  She previously lived in [place]. She moved to [place] in 05/2017.  Her daughter lives in [place]. She is temporarily living in an apartment in [place]. She has a Therapist, occupational.The patient is alone today.  Allergies:  Allergies  Allergen Reactions   Solifenacin Rash   Morphine Nausea And Vomiting    Current Medications: Current Outpatient Medications  Medication Sig Dispense Refill   acetaminophen (TYLENOL) 500 MG tablet Take 1 tablet by mouth in the morning  and at bedtime.     Cholecalciferol (VITAMIN D3) 50 MCG (2000 UT) TABS Take 2,000 Units by mouth daily.     cyanocobalamin (,VITAMIN B-12,) 1000 MCG/ML injection Inject 1,000 mcg into the muscle every 30 (thirty) days.     Multiple Vitamin (MULTI-VITAMIN) tablet Take 1 tablet by mouth in  the morning and at bedtime.     pantoprazole (PROTONIX) 40 MG tablet Take 1 tablet (40 mg total) by mouth daily. MUST SCHEDULE VISIT 90 tablet 0   cephALEXin (KEFLEX) 250 MG capsule Take 250 mg by mouth daily. (Patient not taking: No sig reported)     ciprofloxacin (CIPRO) 500 MG tablet Take 500 mg by mouth 2 (two) times daily. (Patient not taking: Reported on 03/31/2021)     Docusate Sodium (DSS) 100 MG CAPS Take 1 tablet by mouth at bedtime as needed.  (Patient not taking: No sig reported)     solifenacin (VESICARE) 10 MG tablet Take 1 tablet by mouth daily. (Patient not taking: Reported on 03/31/2021)     triamcinolone cream (KENALOG) 0.1 %  (Patient not taking: Reported on 03/31/2021)     No current facility-administered medications for this visit.     Performance status (ECOG): 0-1  Vitals Blood pressure (!) 119/55, pulse 78, temperature 99 F (37.2 C), temperature source Tympanic, weight 120 lb 1.6 oz (54.5 kg), SpO2 100 %.   Physical Exam Vitals and nursing note reviewed.  Constitutional:      General: She is not in acute distress.    Appearance: She is well-developed. She is not diaphoretic.  HENT:     Head: Normocephalic and atraumatic.     Comments: Short blonde hair.    Mouth/Throat:     Mouth: Mucous membranes are moist.     Pharynx: Oropharynx is clear.  Eyes:     General: No scleral icterus.    Conjunctiva/sclera: Conjunctivae normal.     Pupils: Pupils are equal, round, and reactive to light.  Neck:     Vascular: No JVD.  Cardiovascular:     Rate and Rhythm: Normal rate and regular rhythm.     Heart sounds: Normal heart sounds. No murmur heard. Pulmonary:     Effort: Pulmonary effort is normal. No respiratory distress.     Breath sounds: Normal breath sounds. No wheezing or rales.  Abdominal:     General: Bowel sounds are normal. There is no distension.     Palpations: Abdomen is soft. There is no hepatomegaly, splenomegaly or mass.     Tenderness: There is no  abdominal tenderness. There is no guarding or rebound.  Musculoskeletal:        General: Normal range of motion.     Cervical back: Normal range of motion and neck supple.  Lymphadenopathy:     Head:     Right side of head: No preauricular, posterior auricular or occipital adenopathy.     Left side of head: No preauricular, posterior auricular or occipital adenopathy.     Cervical: No cervical adenopathy.     Upper Body:     Right upper body: No supraclavicular or axillary adenopathy.     Left upper body: No supraclavicular or axillary adenopathy.     Lower Body: No right inguinal adenopathy. No left inguinal adenopathy.  Skin:    General: Skin is warm and dry.     Coloration: Skin is not pale.     Findings: No erythema or rash.     Comments: Fingernail ridging.  Neurological:  Mental Status: She is alert and oriented to person, place, and time.  Psychiatric:        Mood and Affect: Mood normal.   No visits with results within 3 Day(s) from this visit.  Latest known visit with results is:  Appointment on 07/05/2021  Component Date Value Ref Range Status   Iron 07/05/2021 43  28 - 170 ug/dL Final   TIBC 07/05/2021 357  250 - 450 ug/dL Final   Saturation Ratios 07/05/2021 12  10.4 - 31.8 % Final   UIBC 07/05/2021 314  ug/dL Final   Performed at [facility], [address] [place], [place] [address]   Ferritin 07/05/2021 36  11 - 307 ng/mL Final   Performed at [facility], [address] [place], [place] [address]   WBC 07/05/2021 3.9 (A) 4.0 - 10.5 K/uL Final   RBC 07/05/2021 3.36 (A) 3.87 - 5.11 MIL/uL Final   Hemoglobin 07/05/2021 10.9 (A) 12.0 - 15.0 g/dL Final   HCT 07/05/2021 35.5 (A) 36.0 - 46.0 % Final   MCV 07/05/2021 105.7 (A) 80.0 - 100.0 fL Final   MCH 07/05/2021 32.4  26.0 - 34.0 pg Final   MCHC 07/05/2021 30.7  30.0 - 36.0 g/dL Final   RDW 07/05/2021 13.5  11.5 - 15.5 % Final   Platelets 07/05/2021 208  150 - 400 K/uL Final   nRBC  07/05/2021 0.0  0.0 - 0.2 % Final   Neutrophils Relative % 07/05/2021 55  % Final   Neutro Abs 07/05/2021 2.2  1.7 - 7.7 K/uL Final   Lymphocytes Relative 07/05/2021 30  % Final   Lymphs Abs 07/05/2021 1.2  0.7 - 4.0 K/uL Final   Monocytes Relative 07/05/2021 10  % Final   Monocytes Absolute 07/05/2021 0.4  0.1 - 1.0 K/uL Final   Eosinophils Relative 07/05/2021 3  % Final   Eosinophils Absolute 07/05/2021 0.1  0.0 - 0.5 K/uL Final   Basophils Relative 07/05/2021 1  % Final   Basophils Absolute 07/05/2021 0.0  0.0 - 0.1 K/uL Final   Immature Granulocytes 07/05/2021 1  % Final   Abs Immature Granulocytes 07/05/2021 0.02  0.00 - 0.07 K/uL Final   Performed at [facility], [address] [place], [place] [address]   Retic Ct Pct 07/05/2021 1.5  0.4 - 3.1 % Final   RBC. 07/05/2021 3.35 (A) 3.87 - 5.11 MIL/uL Final   Retic Count, Absolute 07/05/2021 50.9  19.0 - 186.0 K/uL Final   Immature Retic Fract 07/05/2021 7.6  2.3 - 15.9 % Final   Reticulocyte Hemoglobin 07/05/2021 34.6  >27.9 pg Final   Comment:        Given the high negative predictive value of a RET-He result > 32 pg iron deficiency is essentially excluded. If this patient is anemic other etiologies should be considered. Performed at [facility], [address] [place], [place] [address]    Sodium 07/05/2021 139  135 - 145 mmol/L Final   Potassium 07/05/2021 4.3  3.5 - 5.1 mmol/L Final   Chloride 07/05/2021 106  98 - 111 mmol/L Final   CO2 07/05/2021 24  22 - 32 mmol/L Final   Glucose, Bld 07/05/2021 101 (A) 70 - 99 mg/dL Final   Glucose reference range applies only to samples taken after fasting for at least 8 hours.   BUN 07/05/2021 33 (A) 8 - 23 mg/dL Final   Creatinine, Ser 07/05/2021 0.66  0.44 - 1.00 mg/dL Final   Calcium  07/05/2021 8.9  8.9 - 10.3 mg/dL Final   Total Protein 07/05/2021 6.6  6.5 - 8.1 g/dL Final   Albumin 07/05/2021 3.6  3.5 - 5.0 g/dL Final   AST 07/05/2021 27  15 - 41 U/L Final    ALT 07/05/2021 28  0 - 44 U/L Final   Alkaline Phosphatase 07/05/2021 73  38 - 126 U/L Final   Total Bilirubin 07/05/2021 0.4  0.3 - 1.2 mg/dL Final   GFR, Estimated 07/05/2021 >60  >60 mL/min Final   Comment: (NOTE) Calculated using the CKD-EPI Creatinine Equation (2021)    Anion gap 07/05/2021 9  5 - 15 Final   Performed at [facility], [address] [place], [place] [address]   Vitamin B-12 07/05/2021 696  180 - 914 pg/mL Final   Comment: (NOTE) This assay is not validated for testing neonatal or myeloproliferative syndrome specimens for Vitamin B12 levels. Performed at [facility], [place] [address] [place], [place] [address]     Assessment and plan 1. Iron deficiency anemia due to chronic blood loss   2. B12 deficiency    #History of iron deficiency in the context of previous partial gastrectomy in 2009 followed by gastrojejunostomy on 03/23/2020.  Labs reviewed and discussed with patient Hemoglobin stable at 10.9.  Iron panel shows slight decrease iron saturation comparing to 4 months ago. Will proceed with IV Venofer and repeat another dose in 4 weeks.  #Vitamin B12 deficiency, Patient has been on monthly vitamin B12 injections. Proceed with vitamin B12 today.  Continue B12 monthly.  #Macrocytic anemia, etiology unclear Patient has normal folate, on B12 injections with normal B12 levels.  Normal thyroid function. Questionable underlying myelodysplastic syndrome.  Continue close monitor.  monthly vitamin B12 injection x3  RTC in 4 months for labs (CBC, ferritin, iron studies, B12-day before) and B12.  I discussed the assessment and treatment plan with the patient.  The patient was provided an opportunity to ask questions and all were answered.  The patient agreed with the plan and demonstrated an understanding of the instructions.  The patient was advised to call back if the symptoms worsen or if the condition fails to improve as anticipated.  Earlie Server, MD,  PhD Hematology Oncology [place] at [facility] 07/14/2021

## 2021-07-14 NOTE — Patient Instructions (Signed)

## 2021-07-18 ENCOUNTER — Other Ambulatory Visit: Payer: Self-pay | Admitting: Gastroenterology

## 2021-07-20 ENCOUNTER — Other Ambulatory Visit: Payer: Self-pay | Admitting: Internal Medicine

## 2021-07-20 DIAGNOSIS — Z1231 Encounter for screening mammogram for malignant neoplasm of breast: Secondary | ICD-10-CM

## 2021-08-02 ENCOUNTER — Ambulatory Visit (INDEPENDENT_AMBULATORY_CARE_PROVIDER_SITE_OTHER): Payer: Medicare Other | Admitting: Gastroenterology

## 2021-08-02 ENCOUNTER — Other Ambulatory Visit: Payer: Self-pay

## 2021-08-02 VITALS — BP 113/76 | HR 76 | Temp 97.7°F | Wt 118.4 lb

## 2021-08-02 DIAGNOSIS — K219 Gastro-esophageal reflux disease without esophagitis: Secondary | ICD-10-CM | POA: Diagnosis not present

## 2021-08-02 DIAGNOSIS — R112 Nausea with vomiting, unspecified: Secondary | ICD-10-CM

## 2021-08-02 MED ORDER — PANTOPRAZOLE SODIUM 20 MG PO TBEC: 20.0000 mg | DELAYED_RELEASE_TABLET | Freq: Every day | ORAL | 1 refills | Status: DC

## 2021-08-02 NOTE — Progress Notes (Signed)
Jocelyn Antigua, MD [address]  [place]  [place], [place] [address]  Main: [phone]  Fax: [phone]   Primary Care Physician: Jocelyn Ruths, MD   Chief Complaint  Patient presents with   Follow-up    Pt denies any new concerns and reports she is doing well with Protonix    HPI: Jocelyn Case is a 75 y.o. female here for follow-up.  Denies any breakthrough heartburn.  No nausea or vomiting.  No abdominal pain.  Good appetite.  No weight loss.  Patient has come a long way, in regard to her nausea vomiting symptoms, previous visits she had a lot of nausea vomiting, but since undergoing her revision surgery, she is not having any further symptoms as long as she takes her Protonix daily.  She recently also saw Dr. 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  The patient denies abdominal or flank pain, anorexia, nausea or vomiting, dysphagia, change in bowel habits or black or bloody stools or weight loss.   Is taking Protonix 40 mg once daily, with no breakthrough symptoms.  Previous history: Last EGD October 2019, with evidence of patent Billroth II gastrojejunostomy.  Mild stenosis and ulceration seen at the [facility] digital anastomosis.  Dilation to 16.5 mm performed with through-the-scope balloon dilator.   underwent EGD in May 2019, due to intermittent nausea and vomiting.  The initial procedure showed large amount of fluid in the stomach, and the procedure had to be rescheduled for next day.  Her future procedures should always does be done with 2 days of clear liquid diet , and n.p.o. past midnight.  Mild stenosis was noted at the Billroth II anastomosis site, and through-the-scope dilator was used to dilate to 13.33mm.  Since the dilation, she reports much improved symptoms.     Is also  on PPI, due to acid reflux, and ulcerated mucosa seen at the anastomosis site, which she states has improved her symptoms as far as relieving her heartburn.   Findings:      The examined esophagus was normal. 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      Retained fluid was found in the entire examined stomach. Removed       immediately after entering the stomach. No residual fluid or food left       after completely suctioning it.      Few non-bleeding superficial gastric ulcers with no stigmata of bleeding       were found at the anastomosis. The largest lesion was 5 mm in largest       dimension. Biopsies were taken with a cold forceps for histology.      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 This was       characterized by mild stenosis and ulceration. A TTS dilator was passed       through the scope. Dilation with a 12-13.5-15 mm balloon dilator was       performed to 13.5 mm. The dilation site was examined and showed good       heme effect.      A 2 to 4 mm sessile polyp with no bleeding was found  in the jejunum.       Biopsies were taken with a cold forceps for histology.      Exam of the jejunum was otherwise normal.      Both limbs of the Bilroth II anatomy were intubated and appeared normal       with wide openings and no narrowing. Impression:           - Normal esophagus. Biopsied. GE Junction at 36 cm                       - Retained gastric fluid.                       - Non-bleeding gastric ulcers at the anastomosis, with                        no stigmata of bleeding. Biopsied.                       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 Dilated.                       - Jejunal polyp(s). Biopsied. These appeared to be                        inflammatory polyps                        - Both limbs of the Bilroth II anatomy were intubated                        and appeared normal with wide openings and no narrowing. Recommendation:       - Take prescribed proton pump inhibitor or H2 blocker                        (antacid) medications 30 - 60 minutes before meals.                       - Follow an antireflux regimen.                       - Await pathology results.   DIAGNOSIS:  A.  POLYP, SMALL BOWEL; COLD BIOPSY:  - POLYPOID GRANULATION TISSUE.  - FOCAL AREA OF GASTRIC TYPE MUCOSA SEEN.  - NEGATIVE FOR INFECTIOUS AGENTS, DYSPLASIA AND MALIGNANCY.   B.  ANASTOMOTIC ULCER; COLD BIOPSY:  - FRAGMENTS OF ULCERATION WITH FRAGMENTS OF MODERATELY INFLAMED GASTRIC  AND SMALL BOWEL MUCOSA.  - NEGATIVE FOR H. PYLORI, DYSPLASIA AND MALIGNANCY.   C.  ESOPHAGUS; COLD BIOPSY:  - INFLAMED ESOPHAGEAL MUCOSA WITH SPONGIOSIS AND UP TO 5 EOSINOPHILS AND  A HIGH-POWER FIELD.  - NEGATIVE FOR DYSPLASIA AND MALIGNANCY.   Note: Changes in the esophagus are most consistent with reflux  esophagitis.    Previous history: As per notes, patient underwent endoscopy in August 2018 "revealing tight opening at the prior surgical area, mucosal ulceration and friable mucosa was detected, biopsy completed."  It appears that the initial EGD could not be completed due  to food in the stomach, and had to be repeated after 2 days of fasting.   Last colonoscopy date: 2015   Actual colonoscopy and EGD report not available.  Above records obtained from clinic notes sent over from [place].   MRCP from February 2008: " Stable MRI of the abdomen and MRCP of the abdomen.  There has been no change since prior examination from November 2007. No evidence of intra-or extrahepatic biliary duct dilation.  No retained stone.  Pancreatic duct appears within normal limits."   [facility]. 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    Their note also states "she does have evidence of bacterial overgrowth and culture and this probably explains a steatorrhea documented on fecal fat testing."  ROS: All ROS reviewed and negative except as per HPI   Past Medical History:  Diagnosis Date   Anemia    Blood transfusion without reported diagnosis    Complication of anesthesia    CPAP (continuous positive airway pressure) dependence    GERD (gastroesophageal reflux disease)    PONV (postoperative nausea and vomiting)    Restless leg syndrome 2015   R leg    Sleep apnea     Past Surgical History:  Procedure Laterality Date   ABDOMINAL HYSTERECTOMY     APPENDECTOMY     CHOLECYSTECTOMY     COLONOSCOPY  2 years ago   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   not digesting good, had to have work on duodenal per pt   ESOPHAGOGASTRODUODENOSCOPY (EGD) WITH PROPOFOL N/A 02/14/2018   Procedure: ESOPHAGOGASTRODUODENOSCOPY (EGD) WITH PROPOFOL;  Surgeon: Jocelyn Manifold, MD;  Location: [facility] ENDOSCOPY;  Service: Endoscopy;  Laterality: N/A;   ESOPHAGOGASTRODUODENOSCOPY (EGD) WITH PROPOFOL N/A 02/15/2018   Procedure: ESOPHAGOGASTRODUODENOSCOPY (EGD) WITH PROPOFOL;  Surgeon: Jocelyn Manifold, MD;  Location: [facility] ENDOSCOPY;  Service: Endoscopy;  Laterality: N/A;   ESOPHAGOGASTRODUODENOSCOPY (EGD) WITH PROPOFOL N/A 07/05/2018   Procedure: ESOPHAGOGASTRODUODENOSCOPY (EGD) WITH PROPOFOL;  Surgeon: Jocelyn Manifold, MD;  Location: [facility] ENDOSCOPY;  Service: Endoscopy;  Laterality: N/A;   STOMACH SURGERY  2013   UPPER GASTROINTESTINAL ENDOSCOPY  05/2017   pt stated "bleeding ulcer and stopped up duodenum was noted"    Prior to Admission medications   Medication Sig Start Date  End Date Taking? Authorizing Provider  acetaminophen (TYLENOL) 500 MG tablet Take 1 tablet by mouth in the morning and at bedtime.   Yes [provider]  Cholecalciferol (VITAMIN D3) 50 MCG (2000 UT) TABS Take 2,000 Units by mouth daily.   Yes [provider]  cyanocobalamin (,VITAMIN B-12,) 1000 MCG/ML injection Inject 1,000 mcg into the muscle every 30 (thirty) days.   Yes [provider]  Docusate Sodium (DSS) 100 MG CAPS Take 1 tablet by mouth at bedtime as needed.   Yes [provider]  Multiple Vitamin (MULTI-VITAMIN) tablet Take 1 tablet by mouth in the morning and at bedtime.   Yes [provider]  pantoprazole (PROTONIX) 20 MG tablet Take 1 tablet (20 mg total) by mouth daily. 08/02/21  Yes Jocelyn Case B, MD  solifenacin (VESICARE) 10 MG tablet Take 1 tablet by mouth daily. 09/03/20  Yes [provider]  triamcinolone cream (KENALOG) 0.1 %  10/29/20  Yes [provider]  cephALEXin (KEFLEX) 250 MG capsule Take 250 mg by mouth daily. Patient not taking: Reported on 08/02/2021    [provider]  ciprofloxacin (CIPRO) 500 MG tablet Take 500 mg by mouth 2 (two) times daily. Patient not taking: Reported on 08/02/2021 03/17/21   [provider]    Family History  Problem Relation Age of Onset   Peptic Ulcer Disease Mother    Dementia Mother    COPD Father    Colon cancer Father      Social History   Tobacco Use   Smoking status: Former    Types: Cigarettes    Quit date: 10/03/1974    Years since quitting: 46.8   Smokeless tobacco: Never  Vaping Use   Vaping Use: Never used  Substance Use Topics   Alcohol use: Not Currently   Drug use: No    Allergies as of 08/02/2021 - Review Complete 08/02/2021  Allergen Reaction Noted   Solifenacin Rash 11/08/2020   Morphine Nausea And Vomiting 08/12/2018    Physical Examination:  Constitutional: General:   Alert,  Well-developed, well-nourished,  pleasant and cooperative in NAD BP 113/76   Pulse 76   Temp 97.7 F (36.5 C) (Oral)   Wt 118 lb 6.4 oz (53.7 kg)   BMI 21.66 kg/m   Respiratory: Normal respiratory effort  Gastrointestinal:  Soft, non-tender and non-distended without masses, hepatosplenomegaly or hernias noted.  No guarding or rebound tenderness.     Cardiac: No clubbing or edema.  No cyanosis. Normal posterior tibial pedal pulses noted.  Psych:  Alert and cooperative. Normal mood and affect.  Musculoskeletal:  Normal gait. Head normocephalic, atraumatic. Symmetrical without gross deformities. 5/5 Lower extremity strength bilaterally.  Skin: Warm. Intact without significant lesions or rashes. No jaundice.  Neck: Supple, trachea midline  Lymph: No cervical lymphadenopathy  Psych:  Alert and oriented x3, Alert and cooperative. Normal mood and affect.  Labs: CMP     Component Value Date/Time   NA 139 07/05/2021 1052   K 4.3 07/05/2021 1052   CL 106 07/05/2021 1052   CO2 24 07/05/2021 1052   GLUCOSE 101 (H) 07/05/2021 1052   BUN 33 (H) 07/05/2021 1052   CREATININE 0.66 07/05/2021 1052   CALCIUM 8.9 07/05/2021 1052   PROT 6.6 07/05/2021 1052   ALBUMIN 3.6 07/05/2021 1052   AST 27 07/05/2021 1052   ALT 28 07/05/2021 1052   ALKPHOS 73 07/05/2021 1052   BILITOT 0.4 07/05/2021 1052   GFRNONAA >60 07/05/2021 1052   GFRAA >60 09/14/2018 0430   Lab Results  Component Value Date   WBC 3.9 (L) 07/05/2021   HGB 10.9 (L) 07/05/2021   HCT 35.5 (L) 07/05/2021   MCV 105.7 (H) 07/05/2021   PLT 208 07/05/2021    Imaging Studies:   Assessment and Plan:   Jocelyn Case is a 75 y.o. y/o female with previous history of Billroth II, with recent revision to Roux-en-Y gastric bypass, with significant improvement in appetite, nausea vomiting since then  Since patient is asymptomatic, she is willing to decrease her Protonix to 20 mg once daily  She is hesitant on completely discontinuing the medication in the future  and this is reasonable given her significant symptoms in the past  She will be on low-dose therapy at this time, however, if symptoms recur on decreasing dose, patient advised to call us and she verbalized understanding  (Risks of PPI use were discussed with patient including bone loss, C. Diff diarrhea,  pneumonia, infections, CKD, electrolyte abnormalities.  Pt. Verbalizes understanding and chooses to continue the medication.)  We discussed need for colonoscopy given her family history of colon cancer. Per literature on uptodate: " Although there is no direct evidence to guide when to end CRC screening among people with a family history, the Science writer (MISCAN)-Colorectal Cancer Model suggested that CRC screening should end at age 87 among persons with one FDR diagnosed after age 59".  Therefore, since she is 50, she qualifies for high rescreening given her family history of colon cancer in her father.  However, patient states she has to figure out who will bring her to her appointment and asked to ask her daughter or her neighbor.  Once she does this, she states she will call us.  We will place a recall in her chart for 6 months for her to schedule colonoscopy if she has not already done so by then   Dr Jocelyn Case

## 2021-08-11 ENCOUNTER — Inpatient Hospital Stay: Payer: Medicare Other | Attending: Oncology

## 2021-08-11 ENCOUNTER — Other Ambulatory Visit: Payer: Self-pay

## 2021-08-11 VITALS — BP 131/60 | HR 73 | Temp 98.4°F | Resp 18

## 2021-08-11 DIAGNOSIS — D509 Iron deficiency anemia, unspecified: Secondary | ICD-10-CM | POA: Diagnosis not present

## 2021-08-11 DIAGNOSIS — E538 Deficiency of other specified B group vitamins: Secondary | ICD-10-CM | POA: Diagnosis not present

## 2021-08-11 DIAGNOSIS — D500 Iron deficiency anemia secondary to blood loss (chronic): Secondary | ICD-10-CM

## 2021-08-11 MED ORDER — IRON SUCROSE 20 MG/ML IV SOLN
200.0000 mg | Freq: Once | INTRAVENOUS | Status: AC
  Administered 2021-08-11: 200 mg via INTRAVENOUS
  Filled 2021-08-11: qty 10

## 2021-08-11 MED ORDER — CYANOCOBALAMIN 1000 MCG/ML IJ SOLN
1000.0000 ug | Freq: Once | INTRAMUSCULAR | Status: AC
  Administered 2021-08-11: 1000 ug via INTRAMUSCULAR
  Filled 2021-08-11: qty 1

## 2021-08-11 MED ORDER — SODIUM CHLORIDE 0.9 % IV SOLN
Freq: Once | INTRAVENOUS | Status: AC
  Filled 2021-08-11: qty 250

## 2021-08-11 MED ORDER — SODIUM CHLORIDE 0.9 % IV SOLN: 200.0000 mg | Freq: Once | INTRAVENOUS | Status: DC

## 2021-08-11 NOTE — Progress Notes (Signed)
Pt received IV venofer and b12 injection in clinic today. VSS @ d/c.

## 2021-08-11 NOTE — Patient Instructions (Addendum)
[facility]   Discharge Instructions: Thank you for choosing [facility] to provide your oncology and hematology care.  If you have a lab appointment with the Cancer Center, please go directly to the Cancer Center and check in at the registration area.  Wear comfortable clothing and clothing appropriate for easy access to any Portacath or PICC line.   We strive to give you quality time with your provider. You may need to reschedule your appointment if you arrive late (15 or more minutes).  Arriving late affects you and other patients whose appointments are after yours.  Also, if you miss three or more appointments without notifying the office, you may be dismissed from the clinic at the provider's discretion.      For prescription refill requests, have your pharmacy contact our office and allow 72 hours for refills to be completed.    BELOW ARE SYMPTOMS THAT SHOULD BE REPORTED IMMEDIATELY: *FEVER GREATER THAN 100.4 F (38 C) OR HIGHER *CHILLS OR SWEATING *NAUSEA AND VOMITING THAT IS NOT CONTROLLED WITH YOUR NAUSEA MEDICATION *UNUSUAL SHORTNESS OF BREATH *UNUSUAL BRUISING OR BLEEDING *URINARY PROBLEMS (pain or burning when urinating, or frequent urination) *BOWEL PROBLEMS (unusual diarrhea, constipation, pain near the anus) TENDERNESS IN MOUTH AND THROAT WITH OR WITHOUT PRESENCE OF ULCERS (sore throat, sores in mouth, or a toothache) UNUSUAL RASH, SWELLING OR PAIN  UNUSUAL VAGINAL DISCHARGE OR ITCHING   Items with * indicate a potential emergency and should be followed up as soon as possible or go to the Emergency Department if any problems should occur.  Please show the CHEMOTHERAPY ALERT CARD or IMMUNOTHERAPY ALERT CARD at check-in to the Emergency Department and triage nurse.  Should you have questions after your visit or need to cancel or reschedule your appointment, please contact [facility]   [phone] and follow the prompts.  Office hours are 8:00 a.m. to 4:30 p.m. Monday - Friday. Please note that voicemails left after 4:00 p.m. may not be returned until the following business day.  We are closed weekends and major holidays. You have access to a nurse at all times for urgent questions. Please call the main number to the clinic [phone] and follow the prompts.  For any non-urgent questions, you may also contact your provider using MyChart. We now offer e-Visits for anyone 18 and older to request care online for non-urgent symptoms. For details visit [email or web address].   Also download the MyChart app! Go to the app store, search "MyChart", open the app, select [place], and log in with your MyChart username and password.  Due to Covid, a mask is required upon entering the hospital/clinic. If you do not have a mask, one will be given to you upon arrival. For doctor visits, patients may have 1 support person aged 18 or older with them. For treatment visits, patients cannot have anyone with them due to current Covid guidelines and our immunocompromised population.   Iron Sucrose Injection What is this medication? IRON SUCROSE (EYE ern SOO krose) treats low levels of iron (iron deficiency anemia) in people with kidney disease. Iron is a mineral that plays an important role in making red blood cells, which carry oxygen from your lungs to the rest of your body. This medicine may be used for other purposes; ask your health care provider or pharmacist if you have questions. COMMON BRAND NAME(S): Venofer What should I tell my care team before I take this medication? They need   to know if you have any of these conditions: Anemia not caused by low iron levels Heart disease High levels of iron in the blood Kidney disease Liver disease An unusual or allergic reaction to iron, other medications, foods, dyes, or preservatives Pregnant or trying to get pregnant Breast-feeding How  should I use this medication? This medication is for infusion into a vein. It is given in a hospital or clinic setting. Talk to your care team about the use of this medication in children. While this medication may be prescribed for children as young as 2 years for selected conditions, precautions do apply. Overdosage: If you think you have taken too much of this medicine contact a poison control center or emergency room at once. NOTE: This medicine is only for you. Do not share this medicine with others. What if I miss a dose? It is important not to miss your dose. Call your care team if you are unable to keep an appointment. What may interact with this medication? Do not take this medication with any of the following: Deferoxamine Dimercaprol Other iron products This medication may also interact with the following: Chloramphenicol Deferasirox This list may not describe all possible interactions. Give your health care provider a list of all the medicines, herbs, non-prescription drugs, or dietary supplements you use. Also tell them if you smoke, drink alcohol, or use illegal drugs. Some items may interact with your medicine. What should I watch for while using this medication? Visit your care team regularly. Tell your care team if your symptoms do not start to get better or if they get worse. You may need blood work done while you are taking this medication. You may need to follow a special diet. Talk to your care team. Foods that contain iron include: whole grains/cereals, dried fruits, beans, or peas, leafy green vegetables, and organ meats (liver, kidney). What side effects may I notice from receiving this medication? Side effects that you should report to your care team as soon as possible: Allergic reactions--skin rash, itching, hives, swelling of the face, lips, tongue, or throat Low blood pressure--dizziness, feeling faint or lightheaded, blurry vision Shortness of breath Side effects  that usually do not require medical attention (report to your care team if they continue or are bothersome): Flushing Headache Joint pain Muscle pain Nausea Pain, redness, or irritation at injection site This list may not describe all possible side effects. Call your doctor for medical advice about side effects. You may report side effects to FDA at 1-800-FDA-1088. Where should I keep my medication? This medication is given in a hospital or clinic and will not be stored at home. NOTE: This sheet is a summary. It may not cover all possible information. If you have questions about this medicine, talk to your doctor, pharmacist, or health care provider.  2022 Elsevier/Gold Standard (2021-02-11 00:00:00)  Vitamin B12 Injection What is this medication? Vitamin B12 (VAHY tuh min B12) prevents and treats low vitamin B12 levels in your body. It is used in people who do not get enough vitamin B12 from their diet or when their digestive tract does not absorb enough. Vitamin B12 plays an important role in maintaining the health of your nervous system and red blood cells. This medicine may be used for other purposes; ask your health care provider or pharmacist if you have questions. COMMON BRAND NAME(S): B-12 Compliance Kit, B-12 Injection Kit, Cyomin, Dodex, LA-12, Nutri-Twelve, Physicians EZ Use B-12, Primabalt What should I tell my care team before  I take this medication? They need to know if you have any of these conditions: Kidney disease Leber's disease Megaloblastic anemia An unusual or allergic reaction to cyanocobalamin, cobalt, other medications, foods, dyes, or preservatives Pregnant or trying to get pregnant Breast-feeding How should I use this medication? This medication is injected into a muscle or deeply under the skin. It is usually given in a clinic or care team's office. However, your care team may teach you how to inject yourself. Follow all instructions. Talk to your care team  about the use of this medication in children. Special care may be needed. Overdosage: If you think you have taken too much of this medicine contact a poison control center or emergency room at once. NOTE: This medicine is only for you. Do not share this medicine with others. What if I miss a dose? If you are given your dose at a clinic or care team's office, call to reschedule your appointment. If you give your own injections, and you miss a dose, take it as soon as you can. If it is almost time for your next dose, take only that dose. Do not take double or extra doses. What may interact with this medication? Colchicine Heavy alcohol intake This list may not describe all possible interactions. Give your health care provider a list of all the medicines, herbs, non-prescription drugs, or dietary supplements you use. Also tell them if you smoke, drink alcohol, or use illegal drugs. Some items may interact with your medicine. What should I watch for while using this medication? Visit your care team regularly. You may need blood work done while you are taking this medication. You may need to follow a special diet. Talk to your care team. Limit your alcohol intake and avoid smoking to get the best benefit. What side effects may I notice from receiving this medication? Side effects that you should report to your care team as soon as possible: Allergic reactions--skin rash, itching, hives, swelling of the face, lips, tongue, or throat Swelling of the ankles, hands, or feet Trouble breathing Side effects that usually do not require medical attention (report to your care team if they continue or are bothersome): Diarrhea This list may not describe all possible side effects. Call your doctor for medical advice about side effects. You may report side effects to FDA at 1-800-FDA-1088. Where should I keep my medication? Keep out of the reach of children. Store at room temperature between 15 and 30 degrees C  (59 and 85 degrees F). Protect from light. Throw away any unused medication after the expiration date. NOTE: This sheet is a summary. It may not cover all possible information. If you have questions about this medicine, talk to your doctor, pharmacist, or health care provider.  2022 Elsevier/Gold Standard (2020-12-01 00:00:00)

## 2021-08-15 ENCOUNTER — Ambulatory Visit: Payer: TRICARE For Life (TFL)

## 2021-09-14 ENCOUNTER — Inpatient Hospital Stay: Payer: Medicare Other | Attending: Oncology

## 2021-09-14 ENCOUNTER — Other Ambulatory Visit: Payer: Self-pay

## 2021-09-14 DIAGNOSIS — E538 Deficiency of other specified B group vitamins: Secondary | ICD-10-CM | POA: Diagnosis not present

## 2021-09-14 DIAGNOSIS — D500 Iron deficiency anemia secondary to blood loss (chronic): Secondary | ICD-10-CM

## 2021-09-14 MED ORDER — CYANOCOBALAMIN 1000 MCG/ML IJ SOLN
1000.0000 ug | Freq: Once | INTRAMUSCULAR | Status: AC
  Administered 2021-09-14: 13:00:00 1000 ug via INTRAMUSCULAR
  Filled 2021-09-14: qty 1

## 2021-10-14 ENCOUNTER — Inpatient Hospital Stay: Payer: Medicare Other | Attending: Oncology

## 2021-10-14 ENCOUNTER — Other Ambulatory Visit: Payer: Self-pay

## 2021-10-14 DIAGNOSIS — D500 Iron deficiency anemia secondary to blood loss (chronic): Secondary | ICD-10-CM

## 2021-10-14 DIAGNOSIS — E538 Deficiency of other specified B group vitamins: Secondary | ICD-10-CM | POA: Diagnosis present

## 2021-10-14 MED ORDER — CYANOCOBALAMIN 1000 MCG/ML IJ SOLN
1000.0000 ug | Freq: Once | INTRAMUSCULAR | Status: AC
  Administered 2021-10-14: 1000 ug via INTRAMUSCULAR
  Filled 2021-10-14: qty 1

## 2021-11-07 ENCOUNTER — Other Ambulatory Visit: Payer: Self-pay | Admitting: *Deleted

## 2021-11-07 DIAGNOSIS — D500 Iron deficiency anemia secondary to blood loss (chronic): Secondary | ICD-10-CM

## 2021-11-14 ENCOUNTER — Inpatient Hospital Stay: Payer: Medicare Other | Attending: Nurse Practitioner

## 2021-11-14 ENCOUNTER — Other Ambulatory Visit: Payer: Self-pay

## 2021-11-14 DIAGNOSIS — E538 Deficiency of other specified B group vitamins: Secondary | ICD-10-CM | POA: Insufficient documentation

## 2021-11-14 DIAGNOSIS — D539 Nutritional anemia, unspecified: Secondary | ICD-10-CM | POA: Diagnosis present

## 2021-11-14 DIAGNOSIS — D500 Iron deficiency anemia secondary to blood loss (chronic): Secondary | ICD-10-CM

## 2021-11-14 LAB — CBC WITH DIFFERENTIAL/PLATELET
Abs Immature Granulocytes: 0.01 10*3/uL (ref 0.00–0.07)
Basophils Absolute: 0 10*3/uL (ref 0.0–0.1)
Basophils Relative: 0 %
Eosinophils Absolute: 0.2 10*3/uL (ref 0.0–0.5)
Eosinophils Relative: 4 %
HCT: 38.5 % (ref 36.0–46.0)
Hemoglobin: 11.7 g/dL — ABNORMAL LOW (ref 12.0–15.0)
Immature Granulocytes: 0 %
Lymphocytes Relative: 26 %
Lymphs Abs: 1.2 10*3/uL (ref 0.7–4.0)
MCH: 32.4 pg (ref 26.0–34.0)
MCHC: 30.4 g/dL (ref 30.0–36.0)
MCV: 106.6 fL — ABNORMAL HIGH (ref 80.0–100.0)
Monocytes Absolute: 0.2 10*3/uL (ref 0.1–1.0)
Monocytes Relative: 5 %
Neutro Abs: 3.1 10*3/uL (ref 1.7–7.7)
Neutrophils Relative %: 65 %
Platelets: 225 10*3/uL (ref 150–400)
RBC: 3.61 MIL/uL — ABNORMAL LOW (ref 3.87–5.11)
RDW: 12.8 % (ref 11.5–15.5)
WBC: 4.7 10*3/uL (ref 4.0–10.5)
nRBC: 0 % (ref 0.0–0.2)

## 2021-11-14 LAB — RETIC PANEL
Immature Retic Fract: 4.1 % (ref 2.3–15.9)
RBC.: 3.62 MIL/uL — ABNORMAL LOW (ref 3.87–5.11)
Retic Count, Absolute: 46.7 10*3/uL (ref 19.0–186.0)
Retic Ct Pct: 1.3 % (ref 0.4–3.1)
Reticulocyte Hemoglobin: 34.7 pg (ref >27.9)

## 2021-11-14 LAB — IRON AND TIBC
Iron: 66 ug/dL (ref 28–170)
Saturation Ratios: 17 % (ref 10.4–31.8)
TIBC: 381 ug/dL (ref 250–450)
UIBC: 315 ug/dL

## 2021-11-14 LAB — VITAMIN B12: Vitamin B-12: 554 pg/mL (ref 180–914)

## 2021-11-14 LAB — FERRITIN: Ferritin: 88 ng/mL (ref 11–307)

## 2021-11-15 ENCOUNTER — Ambulatory Visit: Payer: TRICARE For Life (TFL)

## 2021-11-15 ENCOUNTER — Ambulatory Visit: Payer: TRICARE For Life (TFL) | Admitting: Oncology

## 2021-11-17 ENCOUNTER — Inpatient Hospital Stay: Payer: Medicare Other

## 2021-11-17 ENCOUNTER — Inpatient Hospital Stay (HOSPITAL_BASED_OUTPATIENT_CLINIC_OR_DEPARTMENT_OTHER): Payer: Medicare Other | Admitting: Nurse Practitioner

## 2021-11-17 ENCOUNTER — Other Ambulatory Visit: Payer: Self-pay

## 2021-11-17 ENCOUNTER — Encounter: Payer: Self-pay | Admitting: Nurse Practitioner

## 2021-11-17 VITALS — BP 119/48 | HR 80 | Temp 99.7°F | Resp 14 | Wt 120.7 lb

## 2021-11-17 DIAGNOSIS — E538 Deficiency of other specified B group vitamins: Secondary | ICD-10-CM

## 2021-11-17 DIAGNOSIS — D7589 Other specified diseases of blood and blood-forming organs: Secondary | ICD-10-CM

## 2021-11-17 DIAGNOSIS — D500 Iron deficiency anemia secondary to blood loss (chronic): Secondary | ICD-10-CM

## 2021-11-17 DIAGNOSIS — D539 Nutritional anemia, unspecified: Secondary | ICD-10-CM | POA: Diagnosis not present

## 2021-11-17 MED ORDER — CYANOCOBALAMIN 1000 MCG/ML IJ SOLN
1000.0000 ug | Freq: Once | INTRAMUSCULAR | Status: AC
  Administered 2021-11-17: 1000 ug via INTRAMUSCULAR
  Filled 2021-11-17: qty 1

## 2021-11-17 NOTE — Progress Notes (Signed)
[place] [facility]  Hematology Oncology Progress Note [name], [place]  Clinic Day:  11/17/2021  Referring physician: [name], MD  Chief Complaint: Jocelyn Case is a 76 y.o. 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 On EGD she also has an additional small bowel anastomosis. 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  Her symptoms were worsening since removal of the stent and had daily emesis and decreased oral intake. 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  INTERVAL HISTORY Jocelyn Case is a 76 y.o. female who returns to clinic for laboratory evaluation and follow up in consideration of iv iron and b12 injections. She feels well and denies complaints.    Past Medical History:  Diagnosis Date   Anemia    Blood transfusion without reported diagnosis    Complication of anesthesia    CPAP (continuous positive airway pressure) dependence    GERD (gastroesophageal reflux disease)    PONV (postoperative nausea and vomiting)    Restless leg syndrome 2015   R leg    Sleep apnea     Past Surgical History:  Procedure Laterality Date   ABDOMINAL HYSTERECTOMY     APPENDECTOMY     CHOLECYSTECTOMY     COLONOSCOPY  2 years ago   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   not digesting good, had to have work on duodenal per pt   ESOPHAGOGASTRODUODENOSCOPY (EGD) WITH PROPOFOL N/A  02/14/2018   Procedure: ESOPHAGOGASTRODUODENOSCOPY (EGD) WITH PROPOFOL;  Surgeon: Virgel Manifold, MD;  Location: [facility] ENDOSCOPY;  Service: Endoscopy;  Laterality: N/A;   ESOPHAGOGASTRODUODENOSCOPY (EGD) WITH PROPOFOL N/A 02/15/2018   Procedure: ESOPHAGOGASTRODUODENOSCOPY (EGD) WITH PROPOFOL;  Surgeon: Virgel Manifold, MD;  Location: [facility] ENDOSCOPY;  Service: Endoscopy;  Laterality: N/A;   ESOPHAGOGASTRODUODENOSCOPY (EGD) WITH PROPOFOL N/A 07/05/2018   Procedure: ESOPHAGOGASTRODUODENOSCOPY (EGD) WITH PROPOFOL;  Surgeon: Virgel Manifold, MD;  Location: [facility] ENDOSCOPY;  Service: Endoscopy;  Laterality: N/A;   STOMACH SURGERY  2013   UPPER GASTROINTESTINAL ENDOSCOPY  05/2017   pt stated "bleeding ulcer and stopped up duodenum was noted"    Family History  Problem Relation Age of Onset   Peptic Ulcer Disease Mother    Dementia Mother    COPD Father    Colon cancer Father     Social History:  reports that she quit smoking about 47 years ago. She has never used smokeless tobacco. She reports that she does not currently use alcohol. She reports that she does not use drugs.  She drinks wine in the evening.  She previously lived in [place]. She moved to [place] in 05/2017.  Her daughter lives in [place]. She is temporarily living in an apartment in [place]. She has a Therapist, occupational.  Allergies:  Allergies  Allergen Reactions   Solifenacin Rash   Morphine Nausea And Vomiting    Current Medications: Current Outpatient Medications  Medication Sig Dispense Refill   acetaminophen (TYLENOL) 500 MG tablet Take 1 tablet by mouth in the morning and at bedtime.  Cholecalciferol (VITAMIN D3) 50 MCG (2000 UT) TABS Take 2,000 Units by mouth daily.     cyanocobalamin (,VITAMIN B-12,) 1000 MCG/ML injection Inject 1,000 mcg into the muscle every 30 (thirty) days.     Multiple Vitamin (MULTI-VITAMIN) tablet Take 1 tablet by mouth in the morning and at bedtime.     pantoprazole  (PROTONIX) 20 MG tablet Take 1 tablet (20 mg total) by mouth daily. 90 tablet 1   Docusate Sodium (DSS) 100 MG CAPS Take 1 tablet by mouth at bedtime as needed. (Patient not taking: Reported on 11/17/2021)     No current facility-administered medications for this visit.    Performance status (ECOG): 0-1  Vitals Blood pressure (!) 119/48, pulse 80, temperature 99.7 F (37.6 C), temperature source Tympanic, resp. rate 14, weight 120 lb 11.2 oz (54.7 kg), SpO2 100 %.   Physical Exam Constitutional:      Appearance: She is not ill-appearing.  Pulmonary:     Effort: No respiratory distress.  Skin:    General: Skin is dry.     Coloration: Skin is not pale.  Neurological:     Mental Status: She is alert and oriented to person, place, and time.  Psychiatric:        Mood and Affect: Mood normal.        Behavior: Behavior normal.   CBC Latest Ref Rng & Units 11/14/2021 07/05/2021 03/30/2021  WBC 4.0 - 10.5 K/uL 4.7 3.9(L) 4.1  Hemoglobin 12.0 - 15.0 g/dL 11.7(L) 10.9(L) 10.8(L)  Hematocrit 36.0 - 46.0 % 38.5 35.5(L) 34.9(L)  Platelets 150 - 400 K/uL 225 208 221   CMP Latest Ref Rng & Units 07/05/2021 09/14/2018 11/05/2017  Glucose 70 - 99 mg/dL 101(H) 120(H) 97  BUN 8 - 23 mg/dL 33(H) 19 19  Creatinine 0.44 - 1.00 mg/dL 0.66 0.65 0.57  Sodium 135 - 145 mmol/L 139 135 137  Potassium 3.5 - 5.1 mmol/L 4.3 3.8 3.8  Chloride 98 - 111 mmol/L 106 103 105  CO2 22 - 32 mmol/L 24 24 23   Calcium 8.9 - 10.3 mg/dL 8.9 8.3(L) 8.3(L)  Total Protein 6.5 - 8.1 g/dL 6.6 5.6(L) 5.5(L)  Total Bilirubin 0.3 - 1.2 mg/dL 0.4 0.5 0.4  Alkaline Phos 38 - 126 U/L 73 56 55  AST 15 - 41 U/L 27 16 25   ALT 0 - 44 U/L 28 16 19    Iron/TIBC/Ferritin/ %Sat    Component Value Date/Time   IRON 66 11/14/2021 1310   TIBC 381 11/14/2021 1310   FERRITIN 88 11/14/2021 1310   IRONPCTSAT 17 11/14/2021 1310      Assessment and plan No diagnosis found.  # History of iron deficiency in the context of previous partial  gastrectomy in 2009 followed by gastrojejunostomy on 03/23/2020. Labs reviewed and discussed with patient. Hemoglobin stable at 11.7. Ferritin 88. Iron saturation is normal. Retic hemoglobin is also normal. S/p venofer x 2 in November 2022. Hold IV iron.   # Vitamin B12 deficiency- s/p monthly b12 injections. B12 is 554 today. At goal. Plan for monthly b12 injections.   # Macrocytic anemia- etiology unclear. Patient has normal folate, on B12 injections with normal B12 levels. Normal thyroid function. Questionable underlying myelodysplastic syndrome.  Continue close monitor.  B12 monthly Rtc in 4 months- labs (cbc, ferritin, iron studies, b12), Dr Tasia Catchings, +/- venofer, b12.   I discussed the assessment and treatment plan with the patient.  The patient was provided an opportunity to ask questions and all were answered.  The patient agreed with the plan and demonstrated an understanding of the instructions.  The patient was advised to call back if the symptoms worsen or if the condition fails to improve as anticipated.  Beckey Rutter, DNP, AGNP-C [place] [facility] [phone] (clinic) 11/17/2021

## 2021-11-17 NOTE — Progress Notes (Signed)
Pt in for follow up, denies any concerns today. 

## 2021-12-06 ENCOUNTER — Other Ambulatory Visit: Payer: Self-pay

## 2021-12-06 ENCOUNTER — Ambulatory Visit (INDEPENDENT_AMBULATORY_CARE_PROVIDER_SITE_OTHER): Payer: Medicare Other | Admitting: Gastroenterology

## 2021-12-06 ENCOUNTER — Encounter: Payer: Self-pay | Admitting: Gastroenterology

## 2021-12-06 VITALS — BP 128/78 | HR 76 | Temp 97.9°F | Wt 122.2 lb

## 2021-12-06 DIAGNOSIS — R194 Change in bowel habit: Secondary | ICD-10-CM | POA: Diagnosis not present

## 2021-12-06 MED ORDER — NA SULFATE-K SULFATE-MG SULF 17.5-3.13-1.6 GM/177ML PO SOLN
1.0000 | Freq: Once | ORAL | 0 refills | Status: AC
Start: 2021-12-06 — End: 2021-12-06

## 2021-12-06 NOTE — Progress Notes (Signed)
? ? ?Primary Care Physician: [name], MD ? ?Primary Gastroenterologist:  Dr. Lucilla Lame ? ?Chief Complaint  ?Patient presents with  ? New Patient (Initial Visit)  ?  Transfer from Dr Jocelyn Case  ? Follow-up  ? Medication Refill  ? ? ?HPI: Jocelyn Case is a 76 y.o. female here for transfer of care after being seen by one of my partners who is no longer with Jocelyn Case.  The patient had been seen in the past for nausea with a history of a previous Billroth II converted to a gastric bypass Roux-en-Y back in 2021.  The patient had report that her symptoms had improved after having the surgical revision and taking a PPI.  The patient has been receiving iron transfusions by hematology. She reports that she has diarrhea that comes and goes and can last a few days. She reports that she has messed herself with stool. ? ? Previous history:  ?Is taking Protonix 40 mg once daily, with no breakthrough symptoms. ?  ?Previous history: ?Last EGD October 2019, with evidence of patent Billroth II gastrojejunostomy.  Mild stenosis and ulceration seen at the [facility] digital anastomosis.  Dilation to 16.5 mm performed with through-the-scope balloon dilator. ?  ?underwent EGD in May 2019, due to intermittent nausea and vomiting.  The initial procedure showed large amount of fluid in the stomach, and the procedure had to be rescheduled for next day.  Her future procedures should always does be done with 2 days of clear liquid diet , and n.p.o. past midnight.  Mild stenosis was noted at the Billroth II anastomosis site, and through-the-scope dilator was used to dilate to 13.84m.  Since the dilation, she reports much improved symptoms.   ?  ?Is also on PPI, due to acid reflux, and ulcerated mucosa seen at the anastomosis site, which she states has improved her symptoms as far as relieving her heartburn. ?  ?Impression:           - Normal esophagus. Biopsied. GE Junction at 36 cm ?                      - Retained gastric fluid. ?                       - Non-bleeding gastric ulcers at the anastomosis, with  ?                      no stigmata of bleeding. Biopsied. ?                      - A Billroth II anastomosis was found at 48 cm,  ?                      characterized by ulceration and mild stenosis. Dilated. ?                      - Jejunal polyp(s). Biopsied. These appeared to be  ?                      inflammatory polyps ?                      - Both limbs of the Bilroth II anatomy were intubated  ?  and appeared normal with wide openings and no narrowing. ?Recommendation:       - Take prescribed proton pump inhibitor or H2 blocker  ?                      (antacid) medications 30 - 60 minutes before meals. ?                      - Follow an antireflux regimen. ?                      - Await pathology results. ?  ?Previous history: ?As per notes, patient underwent endoscopy in August 2018 "revealing tight opening at the prior surgical area, mucosal ulceration and friable mucosa was detected, biopsy completed."  It appears that the initial EGD could not be completed due to food in the stomach, and had to be repeated after 2 days of fasting. ?  ?Last colonoscopy date: 2015 ?  ?Actual colonoscopy and EGD report not available.  Above records obtained from clinic notes sent over from [place]. ? ? ? ? ?Past Medical History:  ?Diagnosis Date  ? Anemia   ? Blood transfusion without reported diagnosis   ? Complication of anesthesia   ? CPAP (continuous positive airway pressure) dependence   ? GERD (gastroesophageal reflux disease)   ? PONV (postoperative nausea and vomiting)   ? Restless leg syndrome 2015  ? R leg   ? Sleep apnea   ? ? ?Current Outpatient Medications  ?Medication Sig Dispense Refill  ? acetaminophen (TYLENOL) 500 MG tablet Take 1 tablet by mouth in the morning and at bedtime.    ? Cholecalciferol (VITAMIN D3) 50 MCG (2000 UT) TABS Take 2,000 Units by mouth daily.    ? cyanocobalamin (,VITAMIN B-12,) 1000 MCG/ML  injection Inject 1,000 mcg into the muscle every 30 (thirty) days.    ? Docusate Sodium (DSS) 100 MG CAPS Take 1 tablet by mouth at bedtime as needed.    ? Multiple Vitamin (MULTI-VITAMIN) tablet Take 1 tablet by mouth in the morning and at bedtime.    ? pantoprazole (PROTONIX) 20 MG tablet Take 1 tablet (20 mg total) by mouth daily. 90 tablet 1  ? ?No current facility-administered medications for this visit.  ? ? ?Allergies as of 12/06/2021 - Review Complete 12/06/2021  ?Allergen Reaction Noted  ? Solifenacin Rash 11/08/2020  ? Morphine Nausea And Vomiting 08/12/2018  ? ? ?ROS: ? ?General: Negative for anorexia, weight loss, fever, chills, fatigue, weakness. ?ENT: Negative for hoarseness, difficulty swallowing , nasal congestion. ?CV: Negative for chest pain, angina, palpitations, dyspnea on exertion, peripheral edema.  ?Respiratory: Negative for dyspnea at rest, dyspnea on exertion, cough, sputum, wheezing.  ?GI: See history of present illness. ?GU:  Negative for dysuria, hematuria, urinary incontinence, urinary frequency, nocturnal urination.  ?Endo: Negative for unusual weight change.  ?  ?Physical Examination: ? ? BP 128/78   Pulse 76   Temp 97.9 ?F (36.6 ?C) (Oral)   Wt 122 lb 3.2 oz (55.4 kg)   BMI 22.35 kg/m?  ? ?General: Well-nourished, well-developed in no acute distress.  ?Eyes: No icterus. Conjunctivae pink. ?Lungs: Clear to auscultation bilaterally. Non-labored. ?Heart: Regular rate and rhythm, no murmurs rubs or gallops.  ?Abdomen: Bowel sounds are normal, nontender, nondistended, no hepatosplenomegaly or masses, no abdominal bruits or hernia , no rebound or guarding.   ?Extremities: No lower extremity edema. No clubbing or deformities. ?Neuro: Alert and  oriented x 3.  Grossly intact. ?Skin: Warm and dry, no jaundice.   ?Psych: Alert and cooperative, normal mood and affect. ? ?Labs:  ?  ?Imaging Studies: ?No results found. ? ?Assessment and Plan:  ? ?Jocelyn Case is a 76 y.o. y/o female who comes in  today with a history of nausea that has resolved.  The patient now reports that she has diarrhea. The patient states that her diarrhea will come intermittently and last for a few days.  The patient has been told to keep a food diary to look for a possible cause of her diarrhea.  The patient will also be set up for a colonoscopy due to her change in bowel habits.  The patient has been explained the plan and agrees with it. ? ?Food diary and colonoscopy. ? ? ?Lucilla Lame, MD. Marval Regal ? ? ? Note: This dictation was prepared with Dragon dictation along with smaller phrase technology. Any transcriptional errors that result from this process are unintentional.  ?

## 2021-12-12 ENCOUNTER — Telehealth: Payer: Self-pay | Admitting: Gastroenterology

## 2021-12-12 MED ORDER — PANTOPRAZOLE SODIUM 20 MG PO TBEC: 20.0000 mg | DELAYED_RELEASE_TABLET | Freq: Every day | ORAL | 0 refills | Status: DC

## 2021-12-12 NOTE — Telephone Encounter (Signed)
Pt left message has question about medication protonix 40 mg ?

## 2021-12-13 ENCOUNTER — Telehealth: Payer: Self-pay | Admitting: Gastroenterology

## 2021-12-13 NOTE — Telephone Encounter (Signed)
Patient requesting a call back from DR [place]. ?

## 2021-12-13 NOTE — Telephone Encounter (Signed)
Called pt, someone answered and then hung up... I will try again later ?

## 2021-12-15 ENCOUNTER — Other Ambulatory Visit: Payer: Self-pay

## 2021-12-15 ENCOUNTER — Inpatient Hospital Stay: Payer: Medicare Other | Attending: Oncology

## 2021-12-15 DIAGNOSIS — E538 Deficiency of other specified B group vitamins: Secondary | ICD-10-CM | POA: Diagnosis present

## 2021-12-15 MED ORDER — CYANOCOBALAMIN 1000 MCG/ML IJ SOLN
1000.0000 ug | Freq: Once | INTRAMUSCULAR | Status: AC
  Administered 2021-12-15: 1000 ug via INTRAMUSCULAR
  Filled 2021-12-15: qty 1

## 2021-12-15 NOTE — Telephone Encounter (Signed)
Left detailed msg on VM per HIPAA  

## 2021-12-19 ENCOUNTER — Telehealth: Payer: Self-pay | Admitting: Gastroenterology

## 2021-12-19 NOTE — Telephone Encounter (Signed)
Pt left message to ask question about her medication before her procedure. ?

## 2021-12-20 MED ORDER — NA SULFATE-K SULFATE-MG SULF 17.5-3.13-1.6 GM/177ML PO SOLN
1.0000 | Freq: Once | ORAL | 0 refills | Status: AC
Start: 2021-12-20 — End: 2021-12-20

## 2021-12-20 NOTE — Telephone Encounter (Signed)
Pt reports that she has been taking the '20mg'$  and she is now having GERD Sx she did not have on the 40 mg.... Pt would like to know if she can go back to the '40mg'$  QD.... Please advise  ?

## 2021-12-20 NOTE — Addendum Note (Signed)
Addended by: Lurlean Nanny on: 12/20/2021 04:55 PM ? ? Modules accepted: Orders ? ?

## 2021-12-20 NOTE — Addendum Note (Signed)
Addended by: Lurlean Nanny on: 12/20/2021 04:53 PM ? ? Modules accepted: Orders ? ?

## 2021-12-21 MED ORDER — PANTOPRAZOLE SODIUM 40 MG PO TBEC: 40.0000 mg | DELAYED_RELEASE_TABLET | Freq: Every day | ORAL | 2 refills | Status: DC

## 2021-12-21 NOTE — Addendum Note (Signed)
Addended by: Lurlean Nanny on: 12/21/2021 08:09 AM ? ? Modules accepted: Orders ? ?

## 2022-01-04 ENCOUNTER — Encounter: Payer: Self-pay | Admitting: Hematology and Oncology

## 2022-01-10 ENCOUNTER — Ambulatory Visit: Payer: Medicare Other | Admitting: Anesthesiology

## 2022-01-10 ENCOUNTER — Ambulatory Visit
Admission: RE | Admit: 2022-01-10 | Discharge: 2022-01-10 | Disposition: A | Discharge status: Home / Self Care | Payer: Medicare Other | Attending: Gastroenterology | Admitting: Gastroenterology

## 2022-01-10 ENCOUNTER — Encounter
Admission: RE | Disposition: A | Discharge status: Home / Self Care | Payer: Self-pay | Source: Home / Self Care | Attending: Gastroenterology

## 2022-01-10 ENCOUNTER — Encounter: Payer: Self-pay | Admitting: Gastroenterology

## 2022-01-10 DIAGNOSIS — G4730 Sleep apnea, unspecified: Secondary | ICD-10-CM | POA: Insufficient documentation

## 2022-01-10 DIAGNOSIS — K529 Noninfective gastroenteritis and colitis, unspecified: Secondary | ICD-10-CM | POA: Insufficient documentation

## 2022-01-10 DIAGNOSIS — Z87891 Personal history of nicotine dependence: Secondary | ICD-10-CM | POA: Insufficient documentation

## 2022-01-10 DIAGNOSIS — K641 Second degree hemorrhoids: Secondary | ICD-10-CM | POA: Insufficient documentation

## 2022-01-10 DIAGNOSIS — K219 Gastro-esophageal reflux disease without esophagitis: Secondary | ICD-10-CM | POA: Insufficient documentation

## 2022-01-10 DIAGNOSIS — R194 Change in bowel habit: Secondary | ICD-10-CM | POA: Diagnosis not present

## 2022-01-10 DIAGNOSIS — Z79899 Other long term (current) drug therapy: Secondary | ICD-10-CM | POA: Insufficient documentation

## 2022-01-10 HISTORY — PX: COLONOSCOPY WITH PROPOFOL: SHX5780

## 2022-01-10 SURGERY — COLONOSCOPY WITH PROPOFOL
Anesthesia: General

## 2022-01-10 MED ORDER — ONDANSETRON HCL 4 MG/2ML IJ SOLN
INTRAMUSCULAR | Status: DC | PRN
  Administered 2022-01-10: 4 mg via INTRAVENOUS

## 2022-01-10 MED ORDER — PHENYLEPHRINE HCL (PRESSORS) 10 MG/ML IV SOLN
INTRAVENOUS | Status: DC | PRN
  Administered 2022-01-10: 80 ug via INTRAVENOUS

## 2022-01-10 MED ORDER — PROPOFOL 500 MG/50ML IV EMUL
INTRAVENOUS | Status: DC | PRN
Start: 2022-01-10 — End: 2022-01-10
  Administered 2022-01-10: 140 ug/kg/min via INTRAVENOUS

## 2022-01-10 MED ORDER — PROPOFOL 10 MG/ML IV BOLUS
INTRAVENOUS | Status: DC | PRN
  Administered 2022-01-10: 60 mg via INTRAVENOUS

## 2022-01-10 MED ORDER — SODIUM CHLORIDE 0.9 % IV SOLN: INTRAVENOUS | Status: DC

## 2022-01-10 MED ORDER — LIDOCAINE HCL (CARDIAC) PF 100 MG/5ML IV SOSY
PREFILLED_SYRINGE | INTRAVENOUS | Status: DC | PRN
Start: 2022-01-10 — End: 2022-01-10
  Administered 2022-01-10: 40 mg via INTRAVENOUS

## 2022-01-10 NOTE — Anesthesia Preprocedure Evaluation (Addendum)
Anesthesia Evaluation  ?Patient identified by MRN, date of birth, ID band ?Patient awake ? ? ? ?Reviewed: ?Allergy & Precautions, H&P , NPO status , Patient's Chart, lab work & pertinent test results ? ?History of Anesthesia Complications ?(+) PONV and history of anesthetic complications ? ?Airway ?Mallampati: II ? ?TM Distance: <3 FB ?Neck ROM: limited ? ? ? Dental ? ?(+) Poor Dentition ?  ?Pulmonary ?sleep apnea and Continuous Positive Airway Pressure Ventilation , former smoker,  ?  ?Pulmonary exam normal ? ? ? ? ? ? ? Cardiovascular ?Exercise Tolerance: Good ?(-) angina(-) Past MI and (-) DOE negative cardio ROS ?Normal cardiovascular exam ? ? ?  ?Neuro/Psych ? Headaches, negative psych ROS  ? GI/Hepatic ?Neg liver ROS, GERD  Medicated and Controlled,  ?Endo/Other  ?negative endocrine ROS ? Renal/GU ?negative Renal ROS  ?negative genitourinary ?  ?Musculoskeletal ? ? Abdominal ?Normal abdominal exam  (+)   ?Peds ? Hematology ?negative hematology ROS ?(+)   ?Anesthesia Other Findings ?Past Medical History: ?No date: Anemia ?No date: Blood transfusion without reported diagnosis ?No date: GERD (gastroesophageal reflux disease) ?2015: Restless leg syndrome ?    Comment:  R leg  ? ?Past Surgical History: ?No date: ABDOMINAL HYSTERECTOMY ?No date: APPENDECTOMY ?No date: CHOLECYSTECTOMY ?2 years ago: COLONOSCOPY ?2013: STOMACH SURGERY ?05/2017: UPPER GASTROINTESTINAL ENDOSCOPY ?    Comment:  pt stated "bleeding ulcer and stopped up duodenum was  ?             noted" ? ?BMI   ? Body Mass Index:  24.69 kg/m?  ?  ? ? Reproductive/Obstetrics ?negative OB ROS ? ?  ? ? ? ? ? ? ? ? ? ? ? ? ? ?  ?  ? ? ? ? ? ? ? ?Anesthesia Physical ? ?Anesthesia Plan ? ?ASA: 2 ? ?Anesthesia Plan: General  ? ?Post-op Pain Management:   ? ?Induction: Intravenous ? ?PONV Risk Score and Plan: Propofol infusion and TIVA ? ?Airway Management Planned: Natural Airway and Nasal Cannula ? ?Additional Equipment:   ? ?Intra-op Plan:  ? ?Post-operative Plan:  ? ?Informed Consent: I have reviewed the patients History and Physical, chart, labs and discussed the procedure including the risks, benefits and alternatives for the proposed anesthesia with the patient or authorized representative who has indicated his/her understanding and acceptance.  ? ? ? ?Dental advisory given ? ?Plan Discussed with: Anesthesiologist, CRNA and Surgeon ? ?Anesthesia Plan Comments: (Patient consented for risks of anesthesia including but not limited to:  ?- adverse reactions to medications ?- risk of intubation if required ?- damage to teeth, lips or other oral mucosa ?- sore throat or hoarseness ?- Damage to heart, brain, lungs or loss of life ? ?Patient voiced understanding.)  ? ? ? ? ? ?Anesthesia Quick Evaluation ? ?

## 2022-01-10 NOTE — Anesthesia Postprocedure Evaluation (Signed)
Anesthesia Post Note ? ?Patient: Jocelyn Case ? ?Procedure(s) Performed: COLONOSCOPY WITH PROPOFOL ? ?Patient location during evaluation: Endoscopy ?Anesthesia Type: General ?Level of consciousness: awake and alert ?Pain management: pain level controlled ?Vital Signs Assessment: post-procedure vital signs reviewed and stable ?Respiratory status: spontaneous breathing, nonlabored ventilation and respiratory function stable ?Cardiovascular status: blood pressure returned to baseline and stable ?Postop Assessment: no apparent nausea or vomiting ?Anesthetic complications: no ? ? ?No notable events documented. ? ? ?Last Vitals:  ?Vitals:  ? 01/10/22 1158 01/10/22 1208  ?BP: 116/68 118/65  ?Pulse: 76 80  ?Resp: (!) 22 20  ?Temp:    ?SpO2: 96% 96%  ?  ?Last Pain:  ?Vitals:  ? 01/10/22 1208  ?TempSrc:   ?PainSc: 0-No pain  ? ? ?  ?  ?  ?  ?  ?  ? ?[place] Ouch ? ? ? ? ?

## 2022-01-10 NOTE — Op Note (Signed)
[facility] ?Gastroenterology ?Patient Name: Jocelyn Case ?Procedure Date: 01/10/2022 11:00 AM ?MRN: [id number] ?Account #: [id number] ?Date of Birth: 25-Nov-1945 ?Admit Type: Outpatient ?Age: 76 ?Room: [facility] ENDO ROOM 4 ?Gender: Female ?Note Status: Finalized ?Instrument Name: Peds Colonoscope [id number] ?Procedure:             Colonoscopy ?Indications:           Chronic diarrhea ?Providers:             Lucilla Lame MD, MD ?Medicines:             Propofol per Anesthesia ?Complications:         No immediate complications. ?Procedure:             Pre-Anesthesia Assessment: ?                       - Prior to the procedure, a History and Physical was  ?                       performed, and patient medications and allergies were  ?                       reviewed. The patient's tolerance of previous  ?                       anesthesia was also reviewed. The risks and benefits  ?                       of the procedure and the sedation options and risks  ?                       were discussed with the patient. All questions were  ?                       answered, and informed consent was obtained. Prior  ?                       Anticoagulants: The patient has taken no previous  ?                       anticoagulant or antiplatelet agents. ASA Grade  ?                       Assessment: II - A patient with mild systemic disease.  ?                       After reviewing the risks and benefits, the patient  ?                       was deemed in satisfactory condition to undergo the  ?                       procedure. ?                       After obtaining informed consent, the colonoscope was  ?                       passed under direct vision. Throughout the procedure,  ?  the patient's blood pressure, pulse, and oxygen  ?                       saturations were monitored continuously. The  ?                       Colonoscope was introduced through the anus and  ?                       advanced  to the the cecum, identified by appendiceal  ?                       orifice and ileocecal valve. The colonoscopy was  ?                       performed without difficulty. The patient tolerated  ?                       the procedure well. The quality of the bowel  ?                       preparation was excellent. ?Findings: ?     The perianal and digital rectal examinations were normal. ?     The colon (entire examined portion) appeared normal. Biopsies were taken  ?     with a cold forceps for histology. ?     Non-bleeding internal hemorrhoids were found during retroflexion. The  ?     hemorrhoids were Grade II (internal hemorrhoids that prolapse but reduce  ?     spontaneously). ?Impression:            - The entire examined colon is normal. Biopsied. ?                       - Non-bleeding internal hemorrhoids. ?Recommendation:        - Discharge patient to home. ?                       - Resume previous diet. ?                       - Continue present medications. ?                       - Await pathology results. ?Procedure Code(s):     --- Professional --- ?                       [phone], Colonoscopy, flexible; with biopsy, single or  ?                       multiple ?Diagnosis Code(s):     --- Professional --- ?                       K52.9, Noninfective gastroenteritis and colitis,  ?                       unspecified ?CPT copyright 2019 American Medical Association. All rights reserved. ?The codes documented in this report are preliminary and upon coder review may  ?be revised to meet current compliance requirements. ?Lucilla Lame MD, MD ?01/10/2022 11:39:36  AM ?This report has been signed electronically. ?Number of Addenda: 0 ?Note Initiated On: 01/10/2022 11:00 AM ?Scope Withdrawal Time: 0 hours 9 minutes 6 seconds  ?Total Procedure Duration: 0 hours 27 minutes 21 seconds  ?Estimated Blood Loss:  Estimated blood loss: none. ?     [facility] ?

## 2022-01-10 NOTE — H&P (Signed)
? ?Lucilla Lame, MD [facility] ?[address] ?[place], [place] [address] ?[phone] ?Fax : [phone] ? ?Primary Care Physician:  [name], MD ?Primary Gastroenterologist:  Dr. Allen Norris ? ?Pre-Procedure History & Physical: ?HPI:  Jocelyn Case is a 76 y.o. female is here for an colonoscopy. ?  ?Past Medical History:  ?Diagnosis Date  ? Anemia   ? Blood transfusion without reported diagnosis   ? Complication of anesthesia   ? CPAP (continuous positive airway pressure) dependence   ? GERD (gastroesophageal reflux disease)   ? PONV (postoperative nausea and vomiting)   ? Restless leg syndrome 2015  ? R leg   ? Sleep apnea   ? ? ?Past Surgical History:  ?Procedure Laterality Date  ? ABDOMINAL HYSTERECTOMY    ? APPENDECTOMY    ? CHOLECYSTECTOMY    ? COLONOSCOPY  2 years ago  ? 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  ? not digesting good, had to have work on duodenal per pt  ? ESOPHAGOGASTRODUODENOSCOPY (EGD) WITH PROPOFOL N/A 02/14/2018  ? Procedure: ESOPHAGOGASTRODUODENOSCOPY (EGD) WITH PROPOFOL;  Surgeon: Virgel Manifold, MD;  Location: [facility];  Service: Endoscopy;  Laterality: N/A;  ? ESOPHAGOGASTRODUODENOSCOPY (EGD) WITH PROPOFOL N/A 02/15/2018  ? Procedure: ESOPHAGOGASTRODUODENOSCOPY (EGD) WITH PROPOFOL;  Surgeon: Virgel Manifold, MD;  Location: [facility];  Service: Endoscopy;  Laterality: N/A;  ? ESOPHAGOGASTRODUODENOSCOPY (EGD) WITH PROPOFOL N/A 07/05/2018  ? Procedure: ESOPHAGOGASTRODUODENOSCOPY (EGD) WITH PROPOFOL;  Surgeon: Virgel Manifold, MD;  Location: [facility];  Service: Endoscopy;  Laterality: N/A;  ? STOMACH SURGERY  2013  ? UPPER GASTROINTESTINAL ENDOSCOPY  05/2017  ? pt stated "bleeding ulcer and stopped up duodenum was noted"  ? ? ?Prior to Admission medications   ?Medication Sig Start Date End Date Taking? Authorizing Provider  ?Cholecalciferol (VITAMIN D3) 50 MCG (2000 UT) TABS Take 2,000 Units by mouth daily.   Yes [provider]   ?cyanocobalamin (,VITAMIN B-12,) 1000 MCG/ML injection Inject 1,000 mcg into the muscle every 30 (thirty) days.   Yes [provider]  ?Multiple Vitamin (MULTI-VITAMIN) tablet Take 1 tablet by mouth in the morning and at bedtime.   Yes [provider]  ?pantoprazole (PROTONIX) 40 MG tablet Take 1 tablet (40 mg total) by mouth daily. 12/21/21  Yes Lucilla Lame, MD  ?acetaminophen (TYLENOL) 500 MG tablet Take 1 tablet by mouth in the morning and at bedtime.    [provider]  ?Docusate Sodium (DSS) 100 MG CAPS Take 1 tablet by mouth at bedtime as needed.    [provider]  ? ? ?Allergies as of 12/06/2021 - Review Complete 12/06/2021  ?Allergen Reaction Noted  ? Solifenacin Rash 11/08/2020  ? Morphine Nausea And Vomiting 08/12/2018  ? ? ?Family History  ?Problem Relation Age of Onset  ? Peptic Ulcer Disease Mother   ? Dementia Mother   ? COPD Father   ? Colon cancer Father   ? ? ?Social History  ? ?Socioeconomic History  ? Marital status: Widowed  ?  Spouse name: Not on file  ? Number of children: Not on file  ? Years of education: Not on file  ? Highest education level: Not on file  ?Occupational History  ? Not on file  ?Tobacco Use  ? Smoking status: Former  ?  Types: Cigarettes  ?  Quit date: 10/03/1974  ?  Years since quitting: 47.3  ? Smokeless tobacco: Never  ?Vaping Use  ? Vaping Use: Never used  ?Substance and Sexual Activity  ?  Alcohol use: Yes  ?  Comment: occasional  ? Drug use: No  ? Sexual activity: Never  ?Other Topics Concern  ? Not on file  ?Social History Narrative  ? Not on file  ? ?Social Determinants of Health  ? ?Financial Resource Strain: Not on file  ?Food Insecurity: Not on file  ?Transportation Needs: Not on file  ?Physical Activity: Not on file  ?Stress: Not on file  ?Social Connections: Not on file  ?Intimate Partner Violence: Not on file  ? ? ?Review of Systems: ?See HPI, otherwise negative ROS ? ?Physical Exam: ?BP (!) 146/78   Pulse 82   Temp 97.7 ?F  (36.5 ?C) (Temporal)   Resp 16   Ht '5\' 2"'$  (1.575 m)   Wt 52.2 kg   SpO2 100%   BMI 21.03 kg/m?  ?General:   Alert,  pleasant and cooperative in NAD ?Head:  Normocephalic and atraumatic. ?Neck:  Supple; no masses or thyromegaly. ?Lungs:  Clear throughout to auscultation.    ?Heart:  Regular rate and rhythm. ?Abdomen:  Soft, nontender and nondistended. Normal bowel sounds, without guarding, and without rebound.   ?Neurologic:  Alert and  oriented x4;  grossly normal neurologically. ? ?Impression/Plan: ?Jocelyn Case is here for an colonoscopy to be performed for change in bowel habits ? ?Risks, benefits, limitations, and alternatives regarding  colonoscopy have been reviewed with the patient.  Questions have been answered.  All parties agreeable. ? ? ?Lucilla Lame, MD  01/10/2022, 10:58 AM ?

## 2022-01-10 NOTE — Transfer of Care (Signed)
Immediate Anesthesia Transfer of Care Note ? ?Patient: Jocelyn Case ? ?Procedure(s) Performed: COLONOSCOPY WITH PROPOFOL ? ?Patient Location: PACU ? ?Anesthesia Type:General ? ?Level of Consciousness: awake, alert  and oriented ? ?Airway & Oxygen Therapy: Patient Spontanous Breathing ? ?Post-op Assessment: Report given to RN and Post -op Vital signs reviewed and stable ? ?Post vital signs: Reviewed and stable ? ?Last Vitals:  ?Vitals Value Taken Time  ?BP 112/64   ?Temp    ?Pulse 83   ?Resp 23   ?SpO2 112/64   ? ? ?Last Pain:  ?Vitals:  ? 01/10/22 1021  ?TempSrc: Temporal  ?PainSc: 2   ?   ? ?  ? ?Complications: No notable events documented. ?

## 2022-01-11 ENCOUNTER — Encounter: Payer: Self-pay | Admitting: Gastroenterology

## 2022-01-11 LAB — SURGICAL PATHOLOGY

## 2022-01-12 ENCOUNTER — Encounter: Payer: Self-pay | Admitting: Gastroenterology

## 2022-01-16 ENCOUNTER — Inpatient Hospital Stay: Payer: Medicare Other | Attending: Oncology

## 2022-01-16 DIAGNOSIS — D500 Iron deficiency anemia secondary to blood loss (chronic): Secondary | ICD-10-CM

## 2022-01-16 DIAGNOSIS — E538 Deficiency of other specified B group vitamins: Secondary | ICD-10-CM | POA: Diagnosis present

## 2022-01-16 MED ORDER — CYANOCOBALAMIN 1000 MCG/ML IJ SOLN
1000.0000 ug | Freq: Once | INTRAMUSCULAR | Status: AC
  Administered 2022-01-16: 1000 ug via INTRAMUSCULAR
  Filled 2022-01-16: qty 1

## 2022-01-27 ENCOUNTER — Telehealth: Payer: Self-pay | Admitting: Internal Medicine

## 2022-01-27 NOTE — Telephone Encounter (Signed)
Lm to call office to schedule new patient appointment/ok'd by Dr Nicki Reaper. Please schedule patient out, June or July is fine with Dr Nicki Reaper. ?

## 2022-02-14 ENCOUNTER — Inpatient Hospital Stay: Payer: Medicare Other | Attending: Oncology

## 2022-02-14 DIAGNOSIS — D500 Iron deficiency anemia secondary to blood loss (chronic): Secondary | ICD-10-CM

## 2022-02-14 DIAGNOSIS — E538 Deficiency of other specified B group vitamins: Secondary | ICD-10-CM | POA: Insufficient documentation

## 2022-02-14 MED ORDER — CYANOCOBALAMIN 1000 MCG/ML IJ SOLN
1000.0000 ug | Freq: Once | INTRAMUSCULAR | Status: AC
  Administered 2022-02-14: 1000 ug via INTRAMUSCULAR
  Filled 2022-02-14: qty 1

## 2022-02-16 ENCOUNTER — Other Ambulatory Visit: Payer: TRICARE For Life (TFL)

## 2022-02-16 ENCOUNTER — Ambulatory Visit: Payer: TRICARE For Life (TFL) | Admitting: Oncology

## 2022-02-16 ENCOUNTER — Ambulatory Visit: Payer: TRICARE For Life (TFL)

## 2022-02-22 ENCOUNTER — Ambulatory Visit
Admission: RE | Admit: 2022-02-22 | Discharge: 2022-02-22 | Disposition: A | Discharge status: Home / Self Care | Payer: Medicare Other | Source: Ambulatory Visit | Attending: Internal Medicine | Admitting: Internal Medicine

## 2022-02-22 ENCOUNTER — Encounter: Payer: Self-pay | Admitting: Hematology and Oncology

## 2022-02-22 DIAGNOSIS — Z1231 Encounter for screening mammogram for malignant neoplasm of breast: Secondary | ICD-10-CM

## 2022-02-28 ENCOUNTER — Inpatient Hospital Stay
Admission: RE | Admit: 2022-02-28 | Discharge: 2022-02-28 | Disposition: A | Discharge status: Home / Self Care | Payer: Self-pay | Source: Ambulatory Visit | Attending: *Deleted | Admitting: *Deleted

## 2022-02-28 ENCOUNTER — Inpatient Hospital Stay
Admission: RE | Admit: 2022-02-28 | Discharge: 2022-02-28 | Disposition: A | Discharge status: Home / Self Care | Payer: Self-pay | Source: Ambulatory Visit | Attending: Internal Medicine | Admitting: Internal Medicine

## 2022-02-28 ENCOUNTER — Other Ambulatory Visit: Payer: Self-pay | Admitting: *Deleted

## 2022-02-28 ENCOUNTER — Other Ambulatory Visit: Payer: Self-pay | Admitting: Internal Medicine

## 2022-02-28 DIAGNOSIS — Z1231 Encounter for screening mammogram for malignant neoplasm of breast: Secondary | ICD-10-CM

## 2022-03-03 ENCOUNTER — Other Ambulatory Visit: Payer: Self-pay | Admitting: Internal Medicine

## 2022-03-03 DIAGNOSIS — R928 Other abnormal and inconclusive findings on diagnostic imaging of breast: Secondary | ICD-10-CM

## 2022-03-03 DIAGNOSIS — R921 Mammographic calcification found on diagnostic imaging of breast: Secondary | ICD-10-CM

## 2022-03-10 ENCOUNTER — Ambulatory Visit
Admission: RE | Admit: 2022-03-10 | Discharge: 2022-03-10 | Disposition: A | Discharge status: Home / Self Care | Payer: Medicare Other | Source: Ambulatory Visit | Attending: Internal Medicine | Admitting: Internal Medicine

## 2022-03-10 DIAGNOSIS — R921 Mammographic calcification found on diagnostic imaging of breast: Secondary | ICD-10-CM | POA: Insufficient documentation

## 2022-03-10 DIAGNOSIS — R928 Other abnormal and inconclusive findings on diagnostic imaging of breast: Secondary | ICD-10-CM | POA: Insufficient documentation

## 2022-03-15 ENCOUNTER — Other Ambulatory Visit: Payer: Self-pay

## 2022-03-15 DIAGNOSIS — D500 Iron deficiency anemia secondary to blood loss (chronic): Secondary | ICD-10-CM

## 2022-03-16 ENCOUNTER — Other Ambulatory Visit: Payer: Self-pay

## 2022-03-16 ENCOUNTER — Inpatient Hospital Stay: Payer: Medicare Other | Attending: Oncology

## 2022-03-16 DIAGNOSIS — D500 Iron deficiency anemia secondary to blood loss (chronic): Secondary | ICD-10-CM | POA: Diagnosis present

## 2022-03-16 DIAGNOSIS — E538 Deficiency of other specified B group vitamins: Secondary | ICD-10-CM | POA: Insufficient documentation

## 2022-03-16 LAB — IRON AND TIBC
Iron: 50 ug/dL (ref 28–170)
Saturation Ratios: 14 % (ref 10.4–31.8)
TIBC: 367 ug/dL (ref 250–450)
UIBC: 317 ug/dL

## 2022-03-16 LAB — CBC WITH DIFFERENTIAL/PLATELET
Abs Immature Granulocytes: 0.02 10*3/uL (ref 0.00–0.07)
Basophils Absolute: 0 10*3/uL (ref 0.0–0.1)
Basophils Relative: 0 %
Eosinophils Absolute: 0.4 10*3/uL (ref 0.0–0.5)
Eosinophils Relative: 8 %
HCT: 35.3 % — ABNORMAL LOW (ref 36.0–46.0)
Hemoglobin: 10.8 g/dL — ABNORMAL LOW (ref 12.0–15.0)
Immature Granulocytes: 0 %
Lymphocytes Relative: 29 %
Lymphs Abs: 1.3 10*3/uL (ref 0.7–4.0)
MCH: 32.3 pg (ref 26.0–34.0)
MCHC: 30.6 g/dL (ref 30.0–36.0)
MCV: 105.7 fL — ABNORMAL HIGH (ref 80.0–100.0)
Monocytes Absolute: 0.4 10*3/uL (ref 0.1–1.0)
Monocytes Relative: 9 %
Neutro Abs: 2.4 10*3/uL (ref 1.7–7.7)
Neutrophils Relative %: 54 %
Platelets: 243 10*3/uL (ref 150–400)
RBC: 3.34 MIL/uL — ABNORMAL LOW (ref 3.87–5.11)
RDW: 13.2 % (ref 11.5–15.5)
WBC: 4.5 10*3/uL (ref 4.0–10.5)
nRBC: 0 % (ref 0.0–0.2)

## 2022-03-16 LAB — FERRITIN: Ferritin: 60 ng/mL (ref 11–307)

## 2022-03-16 LAB — VITAMIN B12: Vitamin B-12: 572 pg/mL (ref 180–914)

## 2022-03-17 ENCOUNTER — Other Ambulatory Visit: Payer: Self-pay

## 2022-03-17 ENCOUNTER — Other Ambulatory Visit: Payer: TRICARE For Life (TFL)

## 2022-03-20 ENCOUNTER — Encounter: Payer: Self-pay | Admitting: Oncology

## 2022-03-20 ENCOUNTER — Inpatient Hospital Stay: Payer: Medicare Other

## 2022-03-20 ENCOUNTER — Inpatient Hospital Stay (HOSPITAL_BASED_OUTPATIENT_CLINIC_OR_DEPARTMENT_OTHER): Payer: Medicare Other | Admitting: Oncology

## 2022-03-20 VITALS — BP 125/55 | HR 72 | Temp 97.8°F | Resp 18

## 2022-03-20 VITALS — BP 111/63 | HR 80 | Temp 98.0°F | Ht 62.0 in | Wt 118.0 lb

## 2022-03-20 DIAGNOSIS — D500 Iron deficiency anemia secondary to blood loss (chronic): Secondary | ICD-10-CM

## 2022-03-20 DIAGNOSIS — D7589 Other specified diseases of blood and blood-forming organs: Secondary | ICD-10-CM | POA: Diagnosis not present

## 2022-03-20 DIAGNOSIS — E538 Deficiency of other specified B group vitamins: Secondary | ICD-10-CM

## 2022-03-20 MED ORDER — IRON SUCROSE 20 MG/ML IV SOLN
200.0000 mg | Freq: Once | INTRAVENOUS | Status: AC
  Administered 2022-03-20: 200 mg via INTRAVENOUS
  Filled 2022-03-20: qty 10

## 2022-03-20 MED ORDER — SODIUM CHLORIDE 0.9 % IV SOLN
Freq: Once | INTRAVENOUS | Status: AC
  Filled 2022-03-20: qty 250

## 2022-03-20 MED ORDER — SODIUM CHLORIDE 0.9 % IV SOLN: 200.0000 mg | Freq: Once | INTRAVENOUS | Status: DC

## 2022-03-20 MED ORDER — CYANOCOBALAMIN 1000 MCG/ML IJ SOLN
1000.0000 ug | Freq: Once | INTRAMUSCULAR | Status: AC
  Administered 2022-03-20: 1000 ug via INTRAMUSCULAR
  Filled 2022-03-20: qty 1

## 2022-03-20 NOTE — Patient Instructions (Signed)

## 2022-03-21 ENCOUNTER — Encounter: Payer: Self-pay | Admitting: Hematology and Oncology

## 2022-03-21 NOTE — Progress Notes (Signed)
Hematology/Oncology Progress note [phone] [phone]      Clinic Day:  03/21/2022  Referring physician: [name], MD  Chief Complaint: Jocelyn Case is a 76 y.o. female presents for follow up of B12 deficiency and iron deficiency, s/p partial gastrectomy in 2009  [place]  Patient previously followed up by Dr.Corcoran, patient switched care to me on 03/31/21 Extensive medical record review was performed by me  2008 history including partial gastrectomy and reported Bilroth II reconstruction (unclear if vagotomy was performed). On EGD she also has an additional small bowel anastomosis. 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  Her symptoms were worsening since removal of the stent and had daily emesis and decreased oral intake. 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  INTERVAL HISTORY Jocelyn Case is a 76 y.o. female who has above history reviewed by me today presents for follow up visit for management of iron deficiency anemia, B12 deficiency. Patient reports feeling tired.  Otherwise no new complaints. Denies any active bleeding events.  Past Medical History:  Diagnosis Date   Anemia    Blood transfusion without reported diagnosis    Complication of anesthesia    CPAP (continuous positive airway pressure) dependence    GERD (gastroesophageal reflux disease)    PONV (postoperative nausea and vomiting)    Restless leg syndrome 2015   R leg    Sleep apnea     Past Surgical History:  Procedure Laterality Date   ABDOMINAL HYSTERECTOMY     APPENDECTOMY     AUGMENTATION MAMMAPLASTY Bilateral 3403   silicone removed 5248/ right side ruptured   BREAST EXCISIONAL BIOPSY Right    neg   CHOLECYSTECTOMY      COLONOSCOPY  2 years ago   COLONOSCOPY WITH PROPOFOL N/A 01/10/2022   Procedure: COLONOSCOPY WITH PROPOFOL;  Surgeon: Lucilla Lame, MD;  Location: [facility];  Service: Endoscopy;  Laterality: N/A;   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   not digesting good, had to have work on duodenal per pt   ESOPHAGOGASTRODUODENOSCOPY (EGD) WITH PROPOFOL N/A 02/14/2018   Procedure: ESOPHAGOGASTRODUODENOSCOPY (EGD) WITH PROPOFOL;  Surgeon: Virgel Manifold, MD;  Location: [facility];  Service: Endoscopy;  Laterality: N/A;   ESOPHAGOGASTRODUODENOSCOPY (EGD) WITH PROPOFOL N/A 02/15/2018   Procedure: ESOPHAGOGASTRODUODENOSCOPY (EGD) WITH PROPOFOL;  Surgeon: Virgel Manifold, MD;  Location: [facility];  Service: Endoscopy;  Laterality: N/A;   ESOPHAGOGASTRODUODENOSCOPY (EGD) WITH PROPOFOL N/A 07/05/2018   Procedure: ESOPHAGOGASTRODUODENOSCOPY (EGD) WITH PROPOFOL;  Surgeon: Virgel Manifold, MD;  Location: [facility];  Service: Endoscopy;  Laterality: N/A;   STOMACH SURGERY  2013   UPPER GASTROINTESTINAL ENDOSCOPY  05/2017   pt stated "bleeding ulcer and stopped up duodenum was noted"    Family History  Problem Relation Age of Onset   Peptic Ulcer Disease Mother    Dementia Mother    COPD Father    Colon cancer Father     Social History:  reports that she quit smoking about 47 years ago. Her smoking use included cigarettes. She has never used smokeless tobacco. She reports current alcohol use. She reports that she does not use drugs.   Allergies:  Allergies  Allergen Reactions   Solifenacin Rash   Morphine Nausea And Vomiting    Current Medications: Current Outpatient Medications  Medication Sig Dispense Refill   acetaminophen (TYLENOL)  500 MG tablet Take 1 tablet by mouth in the morning and at bedtime.     Cholecalciferol (VITAMIN D3) 50 MCG (2000 UT) TABS Take 2,000 Units by mouth daily.     cyanocobalamin (,VITAMIN B-12,) 1000 MCG/ML injection Inject 1,000 mcg into  the muscle every 30 (thirty) days.     Docusate Sodium (DSS) 100 MG CAPS Take 1 tablet by mouth at bedtime as needed.     Multiple Vitamin (MULTI-VITAMIN) tablet Take 1 tablet by mouth in the morning and at bedtime.     pantoprazole (PROTONIX) 40 MG tablet Take 1 tablet (40 mg total) by mouth daily. 90 tablet 2   No current facility-administered medications for this visit.     Performance status (ECOG): 0-1  Vitals Blood pressure 111/63, pulse 80, temperature 98 F (36.7 C), temperature source Tympanic, height 5' 2" (1.575 m), weight 118 lb (53.5 kg).   Physical Exam Constitutional:      General: She is not in acute distress.    Appearance: She is not diaphoretic.  HENT:     Head: Normocephalic and atraumatic.     Nose: Nose normal.     Mouth/Throat:     Pharynx: No oropharyngeal exudate.  Eyes:     General: No scleral icterus.    Pupils: Pupils are equal, round, and reactive to light.  Cardiovascular:     Rate and Rhythm: Normal rate and regular rhythm.     Heart sounds: No murmur heard. Pulmonary:     Effort: Pulmonary effort is normal. No respiratory distress.     Breath sounds: No rales.  Chest:     Chest wall: No tenderness.  Abdominal:     General: There is no distension.     Palpations: Abdomen is soft.     Tenderness: There is no abdominal tenderness.  Musculoskeletal:        General: Normal range of motion.     Cervical back: Normal range of motion and neck supple.  Skin:    General: Skin is warm and dry.     Findings: No erythema.  Neurological:     Mental Status: She is alert and oriented to person, place, and time.     Cranial Nerves: No cranial nerve deficit.     Motor: No abnormal muscle tone.     Coordination: Coordination normal.  Psychiatric:        Mood and Affect: Mood and affect normal.     No visits with results within 3 Day(s) from this visit.  Latest known visit with results is:  Orders Only on 03/16/2022  Component Date Value Ref Range  Status   Vitamin B-12 03/16/2022 572  180 - 914 pg/mL Final   Comment: (NOTE) This assay is not validated for testing neonatal or myeloproliferative syndrome specimens for Vitamin B12 levels. Performed at [facility], [place] [address], [place], [place] [address]    WBC 03/16/2022 4.5  4.0 - 10.5 K/uL Final   RBC 03/16/2022 3.34 (L)  3.87 - 5.11 MIL/uL Final   Hemoglobin 03/16/2022 10.8 (L)  12.0 - 15.0 g/dL Final   HCT 03/16/2022 35.3 (L)  36.0 - 46.0 % Final   MCV 03/16/2022 105.7 (H)  80.0 - 100.0 fL Final   MCH 03/16/2022 32.3  26.0 - 34.0 pg Final   MCHC 03/16/2022 30.6  30.0 - 36.0 g/dL Final   RDW 03/16/2022 13.2  11.5 - 15.5 % Final   Platelets 03/16/2022 243  150 - 400  K/uL Final   nRBC 03/16/2022 0.0  0.0 - 0.2 % Final   Neutrophils Relative % 03/16/2022 54  % Final   Neutro Abs 03/16/2022 2.4  1.7 - 7.7 K/uL Final   Lymphocytes Relative 03/16/2022 29  % Final   Lymphs Abs 03/16/2022 1.3  0.7 - 4.0 K/uL Final   Monocytes Relative 03/16/2022 9  % Final   Monocytes Absolute 03/16/2022 0.4  0.1 - 1.0 K/uL Final   Eosinophils Relative 03/16/2022 8  % Final   Eosinophils Absolute 03/16/2022 0.4  0.0 - 0.5 K/uL Final   Basophils Relative 03/16/2022 0  % Final   Basophils Absolute 03/16/2022 0.0  0.0 - 0.1 K/uL Final   Immature Granulocytes 03/16/2022 0  % Final   Abs Immature Granulocytes 03/16/2022 0.02  0.00 - 0.07 K/uL Final   Performed at [facility], [address], [place], [place] [address]   Ferritin 03/16/2022 60  11 - 307 ng/mL Final   Performed at [facility], [address] [place], [place] [address]   Iron 03/16/2022 50  28 - 170 ug/dL Final   TIBC 03/16/2022 367  250 - 450 ug/dL Final   Saturation Ratios 03/16/2022 14  10.4 - 31.8 % Final   UIBC 03/16/2022 317  ug/dL Final   Performed at [facility], [address], [place], [place] [address]    Assessment and plan 1. Iron deficiency anemia due to chronic blood loss   2.  B12 deficiency   3. Macrocytosis    #History of iron deficiency in the context of previous partial gastrectomy in 2009 followed by gastrojejunostomy on 03/23/2020.  Labs reviewed and discussed with patient Slight decrease of ferritin compared to 4 months ago.  Hemoglobin has also decreased to 10.8. Recommend IV Venofer weekly x4.  #Vitamin B12 deficiency, Patient has been on monthly vitamin B12 injections. Proceed with vitamin B12 injection today and continue monthly injections.  B12 has improved.  #Macrocytic anemia, etiology unclear Patient has normal folate, on B12 injections with normal B12 levels.  Normal thyroid function. Questionable underlying myelodysplastic syndrome.  We discussed about the repeat blood work in the next few months and if she has progressively decreased hemoglobin despite good iron stores and B12/folate level.  May need to consider bone marrow biopsy.  She agrees with the plan.  Follow-up plan Venofer and B12 inj today.  IV venofer weekly x 3 B12 monthly x 2 Lab in 3 months, iron labs, B12, folate, cmp prior to MD + B12 inj/ Venofer.   I discussed the assessment and treatment plan with the patient.  The patient was provided an opportunity to ask questions and all were answered.  The patient agreed with the plan and demonstrated an understanding of the instructions.  The patient was advised to call back if the symptoms worsen or if the condition fails to improve as anticipated.  Earlie Server, MD, PhD [facility] Hematology Oncology 03/20/2022

## 2022-03-22 ENCOUNTER — Other Ambulatory Visit: Payer: Self-pay | Admitting: Internal Medicine

## 2022-03-22 DIAGNOSIS — T8543XS Leakage of breast prosthesis and implant, sequela: Secondary | ICD-10-CM

## 2022-03-22 DIAGNOSIS — R928 Other abnormal and inconclusive findings on diagnostic imaging of breast: Secondary | ICD-10-CM

## 2022-03-22 DIAGNOSIS — Z1231 Encounter for screening mammogram for malignant neoplasm of breast: Secondary | ICD-10-CM

## 2022-03-27 ENCOUNTER — Inpatient Hospital Stay: Payer: Medicare Other

## 2022-03-27 VITALS — BP 104/59 | HR 72 | Temp 97.2°F | Resp 20

## 2022-03-27 DIAGNOSIS — D500 Iron deficiency anemia secondary to blood loss (chronic): Secondary | ICD-10-CM

## 2022-03-27 MED ORDER — SODIUM CHLORIDE 0.9 % IV SOLN: 200.0000 mg | Freq: Once | INTRAVENOUS | Status: DC

## 2022-03-27 MED ORDER — IRON SUCROSE 20 MG/ML IV SOLN
200.0000 mg | Freq: Once | INTRAVENOUS | Status: AC
  Administered 2022-03-27: 200 mg via INTRAVENOUS
  Filled 2022-03-27: qty 10

## 2022-03-27 MED ORDER — SODIUM CHLORIDE 0.9 % IV SOLN
Freq: Once | INTRAVENOUS | Status: AC
  Filled 2022-03-27: qty 250

## 2022-03-30 ENCOUNTER — Encounter: Payer: Self-pay | Admitting: Internal Medicine

## 2022-03-30 ENCOUNTER — Ambulatory Visit (INDEPENDENT_AMBULATORY_CARE_PROVIDER_SITE_OTHER): Payer: Medicare Other | Admitting: Internal Medicine

## 2022-03-30 VITALS — BP 120/68 | HR 72 | Temp 98.3°F | Resp 15 | Ht 62.0 in | Wt 123.0 lb

## 2022-03-30 DIAGNOSIS — G2581 Restless legs syndrome: Secondary | ICD-10-CM

## 2022-03-30 DIAGNOSIS — K219 Gastro-esophageal reflux disease without esophagitis: Secondary | ICD-10-CM

## 2022-03-30 DIAGNOSIS — D500 Iron deficiency anemia secondary to blood loss (chronic): Secondary | ICD-10-CM

## 2022-03-30 DIAGNOSIS — R0989 Other specified symptoms and signs involving the circulatory and respiratory systems: Secondary | ICD-10-CM

## 2022-03-30 DIAGNOSIS — M545 Low back pain, unspecified: Secondary | ICD-10-CM

## 2022-03-30 DIAGNOSIS — E538 Deficiency of other specified B group vitamins: Secondary | ICD-10-CM | POA: Diagnosis not present

## 2022-03-30 DIAGNOSIS — G43009 Migraine without aura, not intractable, without status migrainosus: Secondary | ICD-10-CM

## 2022-03-30 DIAGNOSIS — R5383 Other fatigue: Secondary | ICD-10-CM

## 2022-03-30 DIAGNOSIS — Z9989 Dependence on other enabling machines and devices: Secondary | ICD-10-CM

## 2022-03-30 DIAGNOSIS — Z13220 Encounter for screening for lipoid disorders: Secondary | ICD-10-CM

## 2022-03-30 DIAGNOSIS — G4733 Obstructive sleep apnea (adult) (pediatric): Secondary | ICD-10-CM

## 2022-03-30 NOTE — Progress Notes (Signed)
Patient ID: Casimer Lanius, female   DOB: Nov 28, 1945, 76 y.o.   MRN: [id number]   Subjective:    Patient ID: Casimer Lanius, female    DOB: 05-14-1946, 76 y.o.   MRN: [id number]   Patient here to establish care.   Chief Complaint  Patient presents with   Establish Care   .   HPI Has a history of B12 and IDA followed by hematology.  Is s/p partial gastrectomy 2009.   On EGD she also has an additional small bowel anastomosis. 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  Her symptoms were worsening since removal of the stent and had daily emesis and decreased oral intake. 03/23/2020, patient underwent revision/Roux en Y gastrojejunostomy and resection of Braun enteroenterostomy. Pathology revealed a benign epithelium-lining cyst and small bowel with ulcerated anastomosis and focal fibrous serosal adhesion. On protonix.  Now seeing Dr Allen Norris.  Was having persistent intermittent diarrhea.  Colonoscopy 01/10/22 - internal hemorrhoids otherwise normal.  She also has a history of migraine headaches and OSA - using cpap.  Tries to day active.  No chest pain or sob reported.  Does report over the last year, "knot" low back.  Has tried therapy, acupuncture, ice, heat.  Hs not helped.  States has had MRI.  Told had scoliosis.  Persistent pain.  Request referral to Dr Sharlet Salina.     Past Medical History:  Diagnosis Date   Anemia    Arthritis    Blood transfusion without reported diagnosis    Complication of anesthesia    CPAP (continuous positive airway pressure) dependence    GERD (gastroesophageal reflux disease)    Incontinence    Migraine    PONV (postoperative nausea and vomiting)    Restless leg syndrome 2015   R leg    Sleep apnea    UTI (urinary tract infection)    Past Surgical History:  Procedure Laterality Date   ABDOMINAL HYSTERECTOMY     APPENDECTOMY     AUGMENTATION MAMMAPLASTY Bilateral 0092   silicone removed 3300/ right side ruptured   BREAST EXCISIONAL BIOPSY  Right    neg   CHOLECYSTECTOMY     COLONOSCOPY  2 years ago   COLONOSCOPY WITH PROPOFOL N/A 01/10/2022   Procedure: COLONOSCOPY WITH PROPOFOL;  Surgeon: Lucilla Lame, MD;  Location: [facility];  Service: Endoscopy;  Laterality: N/A;   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   not digesting good, had to have work on duodenal per pt   ESOPHAGOGASTRODUODENOSCOPY (EGD) WITH PROPOFOL N/A 02/14/2018   Procedure: ESOPHAGOGASTRODUODENOSCOPY (EGD) WITH PROPOFOL;  Surgeon: Virgel Manifold, MD;  Location: [facility];  Service: Endoscopy;  Laterality: N/A;   ESOPHAGOGASTRODUODENOSCOPY (EGD) WITH PROPOFOL N/A 02/15/2018   Procedure: ESOPHAGOGASTRODUODENOSCOPY (EGD) WITH PROPOFOL;  Surgeon: Virgel Manifold, MD;  Location: [facility];  Service: Endoscopy;  Laterality: N/A;   ESOPHAGOGASTRODUODENOSCOPY (EGD) WITH PROPOFOL N/A 07/05/2018   Procedure: ESOPHAGOGASTRODUODENOSCOPY (EGD) WITH PROPOFOL;  Surgeon: Virgel Manifold, MD;  Location: [facility];  Service: Endoscopy;  Laterality: N/A;   STOMACH SURGERY  2013   UPPER GASTROINTESTINAL ENDOSCOPY  05/2017   pt stated "bleeding ulcer and stopped up duodenum was noted"   Family History  Problem Relation Age of Onset   Peptic Ulcer Disease Mother    Dementia Mother    COPD Father    Colon cancer Father    Social History   Socioeconomic History   Marital status: Widowed    Spouse name: Not on file  Number of children: Not on file   Years of education: Not on file   Highest education level: Not on file  Occupational History   Not on file  Tobacco Use   Smoking status: Former    Types: Cigarettes    Quit date: 10/03/1974    Years since quitting: 47.5   Smokeless tobacco: Never  Vaping Use   Vaping Use: Never used  Substance and Sexual Activity   Alcohol use: Yes    Comment: occasional   Drug use: No   Sexual activity: Never  Other Topics Concern   Not on file  Social History Narrative   Not on file   Social  Determinants of Health   Financial Resource Strain: Not on file  Food Insecurity: Not on file  Transportation Needs: Not on file  Physical Activity: Not on file  Stress: Not on file  Social Connections: Not on file     Review of Systems  Constitutional:  Negative for appetite change and unexpected weight change.  HENT:  Negative for congestion and sinus pressure.   Respiratory:  Negative for cough, chest tightness and shortness of breath.   Cardiovascular:  Negative for chest pain, palpitations and leg swelling.  Gastrointestinal:  Negative for abdominal pain, nausea and vomiting.  Genitourinary:  Negative for difficulty urinating and dysuria.  Musculoskeletal:  Positive for back pain. Negative for joint swelling and myalgias.  Skin:  Negative for color change and rash.  Neurological:  Negative for dizziness and light-headedness.       No significant headaches currently.   Psychiatric/Behavioral:  Negative for agitation and dysphoric mood.        Objective:     BP 120/68 (BP Location: Left Arm, Patient Position: Sitting, Cuff Size: Small)   Pulse 72   Temp 98.3 F (36.8 C) (Temporal)   Resp 15   Ht '5\' 2"'$  (1.575 m)   Wt 123 lb (55.8 kg)   SpO2 99%   BMI 22.50 kg/m  Wt Readings from Last 3 Encounters:  03/30/22 123 lb (55.8 kg)  03/20/22 118 lb (53.5 kg)  01/10/22 115 lb (52.2 kg)    Physical Exam Vitals reviewed.  Constitutional:      General: She is not in acute distress.    Appearance: Normal appearance.  HENT:     Head: Normocephalic and atraumatic.     Right Ear: External ear normal.     Left Ear: External ear normal.  Eyes:     General: No scleral icterus.       Right eye: No discharge.        Left eye: No discharge.     Conjunctiva/sclera: Conjunctivae normal.  Neck:     Thyroid: No thyromegaly.  Cardiovascular:     Rate and Rhythm: Normal rate and regular rhythm.     Comments: Right carotid bruit.  Pulmonary:     Effort: No respiratory distress.      Breath sounds: Normal breath sounds. No wheezing.  Abdominal:     General: Bowel sounds are normal.     Palpations: Abdomen is soft.     Tenderness: There is no abdominal tenderness.  Musculoskeletal:        General: No swelling or tenderness.     Cervical back: Neck supple. No tenderness.  Lymphadenopathy:     Cervical: No cervical adenopathy.  Skin:    Findings: No erythema or rash.  Neurological:     Mental Status: She is alert.  Psychiatric:  Mood and Affect: Mood normal.        Behavior: Behavior normal.      Outpatient Encounter Medications as of 03/30/2022  Medication Sig   acetaminophen (TYLENOL) 500 MG tablet Take 1 tablet by mouth in the morning and at bedtime.   Cholecalciferol (VITAMIN D3) 50 MCG (2000 UT) TABS Take 2,000 Units by mouth daily.   cyanocobalamin (,VITAMIN B-12,) 1000 MCG/ML injection Inject 1,000 mcg into the muscle every 30 (thirty) days.   Multiple Vitamin (MULTI-VITAMIN) tablet Take 1 tablet by mouth in the morning and at bedtime.   pantoprazole (PROTONIX) 40 MG tablet Take 1 tablet (40 mg total) by mouth daily.   [DISCONTINUED] Docusate Sodium (DSS) 100 MG CAPS Take 1 tablet by mouth at bedtime as needed.   No facility-administered encounter medications on file as of 03/30/2022.     Lab Results  Component Value Date   WBC 4.5 03/16/2022   HGB 10.8 (L) 03/16/2022   HCT 35.3 (L) 03/16/2022   PLT 243 03/16/2022   GLUCOSE 101 (H) 07/05/2021   ALT 28 07/05/2021   AST 27 07/05/2021   NA 139 07/05/2021   K 4.3 07/05/2021   CL 106 07/05/2021   CREATININE 0.66 07/05/2021   BUN 33 (H) 07/05/2021   CO2 24 07/05/2021   TSH 0.502 12/30/2020    MM DIAG BREAST TOMO UNI RIGHT  Result Date: 03/10/2022 CLINICAL DATA:  76 year old female presenting as a recall from screening for possible right breast calcifications. History of breast implants that have now been removed. History of right-sided breast implant rupture. EXAM: DIGITAL DIAGNOSTIC  UNILATERAL RIGHT MAMMOGRAM WITH TOMOSYNTHESIS AND CAD TECHNIQUE: Right digital diagnostic mammography and breast tomosynthesis was performed. The images were evaluated with computer-aided detection. COMPARISON:  Previous exam(s). ACR Breast Density Category c: The breast tissue is heterogeneously dense, which may obscure small masses. FINDINGS: Spot 2D magnification views and full true lateral views of the right breast were performed demonstrating persistence of high density material within the medial aspect of the right breast but no true calcifications. The high-density material is for to represent free silicone from prior implant rupture. IMPRESSION: Probably benign asymmetry in the medial right breast favored to represent free silicone from prior implant rupture. RECOMMENDATION: Diagnostic right breast mammogram in 6 months. I have discussed the findings and recommendations with the patient. If applicable, a reminder letter will be sent to the patient regarding the next appointment. BI-RADS CATEGORY  3: Probably benign. Electronically Signed   By: Audie Pinto M.D.   On: 03/10/2022 16:37      Assessment & Plan:   Problem List Items Addressed This Visit     B12 deficiency    Continue b12 injections.       Carotid bruit    Check carotid ultrasound.       Relevant Orders   VAS US CAROTID   GERD (gastroesophageal reflux disease)    Extensive GI issues/procedures, etc - as outlined.  Seeing Dr Allen Norris.  On protonix.  Appears to be stable currently.  Follow.       Iron deficiency anemia due to chronic blood loss    Receives iron infusions - hematology.  Has had GI evaluation as outlined.  Continue follow up with hematology.       Low back pain    Describes "knot" on her back as outlined.  Has seen surgery.  States had MRI.  Will try to obtain.  Has tried therapy, acupuncture, ice, heat - no help.  Request referral to Dr Sharlet Salina for further evaluation and treatment.       Relevant Orders    Ambulatory referral to Orthopedic Surgery   Migraine headache without aura    Appears to be controlled currently.  Follow.       OSA on CPAP    Continue cpap.  Follow.       Relevant Orders   Basic metabolic panel   Hepatic function panel   RLS (restless legs syndrome)    Treating IDA.  Follow.       Other Visit Diagnoses     Screening cholesterol level    -  Primary   Relevant Orders   Lipid panel   Other fatigue       Relevant Orders   TSH        Einar Pheasant, MD

## 2022-04-01 ENCOUNTER — Encounter: Payer: Self-pay | Admitting: Internal Medicine

## 2022-04-01 DIAGNOSIS — R0989 Other specified symptoms and signs involving the circulatory and respiratory systems: Secondary | ICD-10-CM | POA: Insufficient documentation

## 2022-04-01 DIAGNOSIS — M545 Low back pain, unspecified: Secondary | ICD-10-CM | POA: Insufficient documentation

## 2022-04-01 NOTE — Assessment & Plan Note (Signed)
Describes "knot" on her back as outlined.  Has seen surgery.  States had MRI.  Will try to obtain.  Has tried therapy, acupuncture, ice, heat - no help.  Request referral to Dr Sharlet Salina for further evaluation and treatment.

## 2022-04-01 NOTE — Assessment & Plan Note (Signed)
Continue cpap.  Follow.  

## 2022-04-01 NOTE — Assessment & Plan Note (Signed)
Receives iron infusions - hematology.  Has had GI evaluation as outlined.  Continue follow up with hematology.  

## 2022-04-01 NOTE — Assessment & Plan Note (Signed)
Continue b12 injections.  

## 2022-04-01 NOTE — Assessment & Plan Note (Signed)
Treating IDA.  Follow.

## 2022-04-01 NOTE — Assessment & Plan Note (Signed)
Appears to be controlled currently.  Follow.

## 2022-04-01 NOTE — Assessment & Plan Note (Signed)
Extensive GI issues/procedures, etc - as outlined.  Seeing Dr Wohl.  On protonix.  Appears to be stable currently.  Follow.  

## 2022-04-01 NOTE — Assessment & Plan Note (Signed)
Check carotid ultrasound.  

## 2022-04-03 ENCOUNTER — Inpatient Hospital Stay: Payer: Medicare Other | Attending: Oncology

## 2022-04-03 VITALS — BP 98/59 | HR 72 | Temp 98.0°F | Resp 18

## 2022-04-03 DIAGNOSIS — E538 Deficiency of other specified B group vitamins: Secondary | ICD-10-CM | POA: Diagnosis not present

## 2022-04-03 DIAGNOSIS — D500 Iron deficiency anemia secondary to blood loss (chronic): Secondary | ICD-10-CM | POA: Diagnosis not present

## 2022-04-03 MED ORDER — SODIUM CHLORIDE 0.9 % IV SOLN
200.0000 mg | Freq: Once | INTRAVENOUS | Status: AC
  Administered 2022-04-03: 200 mg via INTRAVENOUS
  Filled 2022-04-03: qty 10

## 2022-04-03 MED ORDER — SODIUM CHLORIDE 0.9 % IV SOLN
INTRAVENOUS | Status: DC | PRN
  Filled 2022-04-03: qty 250

## 2022-04-10 ENCOUNTER — Inpatient Hospital Stay: Payer: Medicare Other

## 2022-04-10 VITALS — BP 104/60 | HR 71 | Temp 98.4°F | Resp 18

## 2022-04-10 DIAGNOSIS — D500 Iron deficiency anemia secondary to blood loss (chronic): Secondary | ICD-10-CM | POA: Diagnosis not present

## 2022-04-10 MED ORDER — SODIUM CHLORIDE 0.9 % IV SOLN
200.0000 mg | Freq: Once | INTRAVENOUS | Status: AC
  Administered 2022-04-10: 200 mg via INTRAVENOUS
  Filled 2022-04-10: qty 10

## 2022-04-10 MED ORDER — SODIUM CHLORIDE 0.9 % IV SOLN
INTRAVENOUS | Status: DC | PRN
  Filled 2022-04-10: qty 250

## 2022-04-19 ENCOUNTER — Ambulatory Visit: Payer: Medicare Other

## 2022-04-26 ENCOUNTER — Inpatient Hospital Stay: Payer: Medicare Other

## 2022-04-26 DIAGNOSIS — D500 Iron deficiency anemia secondary to blood loss (chronic): Secondary | ICD-10-CM

## 2022-04-26 MED ORDER — CYANOCOBALAMIN 1000 MCG/ML IJ SOLN
1000.0000 ug | Freq: Once | INTRAMUSCULAR | Status: AC
  Administered 2022-04-26: 1000 ug via INTRAMUSCULAR
  Filled 2022-04-26: qty 1

## 2022-05-01 ENCOUNTER — Telehealth: Payer: Self-pay | Admitting: Internal Medicine

## 2022-05-01 NOTE — Telephone Encounter (Signed)
S/w pt - has report and disc from [facility] in [place] - will drop off at [facility] office so they can review prior to her appointment tomorrow.

## 2022-05-01 NOTE — Telephone Encounter (Signed)
Attempted to call Jocelyn Case back - I was hung up on twice

## 2022-05-01 NOTE — Telephone Encounter (Signed)
Jocelyn Case ([email or web address]) from [facility] call requesting images (MRI) of patients back before appointment tomorrow.  Their fax number is [phone].

## 2022-05-22 ENCOUNTER — Inpatient Hospital Stay: Payer: Medicare Other | Attending: Oncology

## 2022-05-22 DIAGNOSIS — E538 Deficiency of other specified B group vitamins: Secondary | ICD-10-CM | POA: Insufficient documentation

## 2022-05-22 DIAGNOSIS — D500 Iron deficiency anemia secondary to blood loss (chronic): Secondary | ICD-10-CM | POA: Insufficient documentation

## 2022-05-31 ENCOUNTER — Inpatient Hospital Stay: Payer: Medicare Other

## 2022-05-31 DIAGNOSIS — E538 Deficiency of other specified B group vitamins: Secondary | ICD-10-CM | POA: Diagnosis present

## 2022-05-31 DIAGNOSIS — D500 Iron deficiency anemia secondary to blood loss (chronic): Secondary | ICD-10-CM | POA: Diagnosis present

## 2022-05-31 MED ORDER — CYANOCOBALAMIN 1000 MCG/ML IJ SOLN
1000.0000 ug | Freq: Once | INTRAMUSCULAR | Status: AC
  Administered 2022-05-31: 1000 ug via INTRAMUSCULAR
  Filled 2022-05-31: qty 1

## 2022-06-06 ENCOUNTER — Ambulatory Visit (INDEPENDENT_AMBULATORY_CARE_PROVIDER_SITE_OTHER): Payer: Medicare Other

## 2022-06-06 VITALS — BP 114/72 | HR 69 | Temp 97.3°F | Ht 62.0 in | Wt 122.8 lb

## 2022-06-06 DIAGNOSIS — Z0000 Encounter for general adult medical examination without abnormal findings: Secondary | ICD-10-CM

## 2022-06-06 DIAGNOSIS — Z780 Asymptomatic menopausal state: Secondary | ICD-10-CM | POA: Diagnosis not present

## 2022-06-06 NOTE — Progress Notes (Signed)
Subjective:   Jocelyn Case is a 76 y.o. female who presents for an Initial Medicare Annual Wellness Visit.  Review of Systems    No ROS.  Medicare Wellness   Cardiac Risk Factors include: advanced age (>78mn, >>80women)     Objective:    Today's Vitals   06/06/22 1358  BP: 114/72  Pulse: 69  Temp: (!) 97.3 F (36.3 C)  SpO2: 98%  Weight: 122 lb 12.8 oz (55.7 kg)  Height: '5\' 2"'$  (1.575 m)   Body mass index is 22.46 kg/m.     06/06/2022    2:06 PM 03/20/2022    1:31 PM 01/10/2022   10:18 AM 11/17/2021    1:54 PM 03/31/2021   10:18 AM 07/05/2020    1:12 PM 01/12/2020    1:03 PM  Advanced Directives  Does Patient Have a Medical Advance Directive? Yes Yes Yes Yes Yes Yes No  Type of AParamedicof AVillardLiving will    HVancewill   Does patient want to make changes to medical advance directive? No - Patient declined    No - Patient declined    Copy of [place] Chart? No - copy requested        Would patient like information on creating a medical advance directive?     No - Patient declined  No - Patient declined    Current Medications (verified) Outpatient Encounter Medications as of 06/06/2022  Medication Sig   acetaminophen (TYLENOL) 500 MG tablet Take 1 tablet by mouth in the morning and at bedtime.   Cholecalciferol (VITAMIN D3) 50 MCG (2000 UT) TABS Take 2,000 Units by mouth daily.   cyanocobalamin (,VITAMIN B-12,) 1000 MCG/ML injection Inject 1,000 mcg into the muscle every 30 (thirty) days.   Multiple Vitamin (MULTI-VITAMIN) tablet Take 1 tablet by mouth in the morning and at bedtime.   pantoprazole (PROTONIX) 40 MG tablet Take 1 tablet (40 mg total) by mouth daily.   No facility-administered encounter medications on file as of 06/06/2022.    Allergies (verified) Solifenacin and Morphine   History: Past Medical History:  Diagnosis Date   Anemia    Arthritis    Blood transfusion without  reported diagnosis    Complication of anesthesia    CPAP (continuous positive airway pressure) dependence    GERD (gastroesophageal reflux disease)    Incontinence    Migraine    PONV (postoperative nausea and vomiting)    Restless leg syndrome 2015   R leg    Sleep apnea    UTI (urinary tract infection)    Past Surgical History:  Procedure Laterality Date   ABDOMINAL HYSTERECTOMY     APPENDECTOMY     AUGMENTATION MAMMAPLASTY Bilateral 16440  silicone removed 23474/right side ruptured   BREAST EXCISIONAL BIOPSY Right    neg   CHOLECYSTECTOMY     COLONOSCOPY  2 years ago   COLONOSCOPY WITH PROPOFOL N/A 01/10/2022   Procedure: COLONOSCOPY WITH PROPOFOL;  Surgeon: WLucilla Lame MD;  Location: [facility];  Service: Endoscopy;  Laterality: N/A;   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   not digesting good, had to have work on duodenal per pt   ESOPHAGOGASTRODUODENOSCOPY (EGD) WITH PROPOFOL N/A 02/14/2018   Procedure: ESOPHAGOGASTRODUODENOSCOPY (EGD) WITH PROPOFOL;  Surgeon: TVirgel Manifold MD;  Location: [facility];  Service: Endoscopy;  Laterality: N/A;   ESOPHAGOGASTRODUODENOSCOPY (EGD) WITH PROPOFOL N/A 02/15/2018   Procedure: ESOPHAGOGASTRODUODENOSCOPY (EGD) WITH PROPOFOL;  Surgeon: Virgel Manifold, MD;  Location: [facility] END[facility]COPY;  Service: Endoscopy;  Laterality: N/A;   ESOPHAGOGASTRODUODENOSCOPY (EGD) WITH PROPOFOL N/A 07/05/2018   Procedure: ESOPHAGOGASTRODUODENOSCOPY (EGD) WITH PROPOFOL;  Surgeon: Virgel Manifold, MD;  Location: [facility];  Service: Endoscopy;  Laterality: N/A;   STOMACH SURGERY  2013   UPPER GASTROINTESTINAL ENDOSCOPY  05/2017   pt stated "bleeding ulcer and stopped up duodenum was noted"   Family History  Problem Relation Age of Onset   Peptic Ulcer Disease Mother    Dementia Mother    COPD Father    Colon cancer Father    Social History   Socioeconomic History   Marital status: Widowed    Spouse name: Not on file   Number  of children: Not on file   Years of education: Not on file   Highest education level: Not on file  Occupational History   Not on file  Tobacco Use   Smoking status: Former    Types: Cigarettes    Quit date: 10/03/1974    Years since quitting: 47.7   Smokeless tobacco: Never  Vaping Use   Vaping Use: Never used  Substance and Sexual Activity   Alcohol use: Yes    Comment: occasional   Drug use: No   Sexual activity: Never  Other Topics Concern   Not on file  Social History Narrative   Not on file   Social Determinants of Health   Financial Resource Strain: Low Risk  (06/06/2022)   Overall Financial Resource Strain (CARDIA)    Difficulty of Paying Living Expenses: Not hard at all  Food Insecurity: No Food Insecurity (06/06/2022)   Hunger Vital Sign    Worried About Running Out of Food in the Last Year: Never true    [place] in the Last Year: Never true  Transportation Needs: No Transportation Needs (06/06/2022)   PRAPARE - Hydrologist (Medical): No    Lack of Transportation (Non-Medical): No  Physical Activity: Insufficiently Active (06/06/2022)   Exercise Vital Sign    Days of Exercise per Week: 3 days    Minutes of Exercise per Session: 30 min  Stress: No Stress Concern Present (06/06/2022)   [place]    Feeling of Stress : Not at all  Social Connections: Unknown (06/06/2022)   Social Connection and Isolation Panel [NHANES]    Frequency of Communication with Friends and Family: More than three times a week    Frequency of Social Gatherings with Friends and Family: More than three times a week    Attends Religious Services: Not on Advertising copywriter or Organizations: Not on file    Attends Archivist Meetings: Not on file    Marital Status: Not on file    Tobacco Counseling Counseling given: Not Answered   Clinical Intake:  Pre-visit preparation  completed: Yes        Diabetes: No  How often do you need to have someone help you when you read instructions, pamphlets, or other written materials from your doctor or pharmacy?: 1 - Never    Interpreter Needed?: No      Activities of Daily Living    06/06/2022    2:09 PM  In your present state of health, do you have any difficulty performing the following activities:  Hearing? 1  Vision? 0  Difficulty concentrating or making decisions? 0  Walking or  climbing stairs? 0  Dressing or bathing? 0  Doing errands, shopping? 0  Preparing Food and eating ? N  Comment Meals prepared by staff. Self feeds.  Using the Toilet? N  In the past six months, have you accidently leaked urine? N  Do you have problems with loss of bowel control? N  Managing your Medications? N  Managing your Finances? N  Housekeeping or managing your Housekeeping? Y  Comment Maid assist    Patient Care Team: Einar Pheasant, MD as PCP - General (Internal Medicine) Virgel Manifold, MD (Inactive) as Consulting Physician (Gastroenterology) Cephas Darby Lenetta Quaker, MD as Consulting Physician (Gastroenterology)  Indicate any recent Medical Services you may have received from other than [facility] providers in the past year (date may be approximate).     Assessment:   This is a routine wellness examination for [place].  Hearing/Vision screen Hearing Screening - Comments:: Hearing aid, bilateral  Vision Screening - Comments:: Followed by [facility] Wears corrective lenses Cataract extraction, bilateral They have seen their ophthalmologist in the last 12 months.    Dietary issues and exercise activities discussed: Current Exercise Habits: Home exercise routine, Type of exercise: walking, Time (Minutes): 30, Frequency (Times/Week): 3, Weekly Exercise (Minutes/Week): 90, Intensity: Mild Healthy diet Good water intake   Goals Addressed               This Visit's Progress     Patient Stated      Maintain weight (pt-stated)        Weight goal 122lb       Depression Screen    06/06/2022    2:19 PM 06/06/2022    2:16 PM 03/30/2022    1:32 PM  PHQ 2/9 Scores  PHQ - 2 Score 2 0 0  PHQ- 9 Score 2      Fall Risk    06/06/2022    2:17 PM 03/30/2022    1:32 PM 03/27/2019   10:00 AM 12/14/2017    2:00 PM  [place] in the past year? 0 0 0 No  Number falls in past yr: 0 0    Injury with Fall? 0 0    Risk for fall due to :  No Fall Risks    Follow up Falls evaluation completed Falls evaluation completed      [place]: Home free of loose throw rugs in walkways, pet beds, electrical cords, etc? Yes  Adequate lighting in your home to reduce risk of falls? Yes   ASSISTIVE DEVICES UTILIZED TO PREVENT FALLS: Life alert? No  Use of a cane, walker or w/c? No  Grab bars in the bathroom? Yes  Shower chair or bench in shower? Yes  Elevated toilet seat or a handicapped toilet? Yes   TIMED UP AND GO: Was the test performed? Yes .  Length of time to ambulate 10 feet: 10 sec.   Gait steady and fast without use of assistive device  Cognitive Function:        06/06/2022    2:39 PM  6CIT Screen  What Year? 0 points  What month? 0 points  What time? 0 points  Count back from 20 0 points  Months in reverse 0 points  Repeat phrase 0 points  Total Score 0 points    Immunizations Immunization History  Administered Date(s) Administered   Influenza Inj Mdck Quad Pf 07/31/2017, 06/25/2019   Influenza, High Dose Seasonal PF 07/31/2017, 07/11/2018, 06/25/2019, 06/22/2020  Influenza-Unspecified 07/31/2017   PFIZER Comirnaty(Gray Top)Covid-19 Tri-Sucrose Vaccine 10/25/2019, 11/15/2019   Covid-19 vaccine status: Completed vaccines x3.  Screening Tests Health Maintenance  Topic Date Due   DEXA SCAN  Never done   COVID-19 Vaccine (4 - Pfizer series) 06/22/2022 (Originally 04/20/2021)   INFLUENZA VACCINE  12/31/2022 (Originally 05/02/2022)    Pneumonia Vaccine 86+ Years old (1 - PCV) 03/31/2023 (Originally 11/07/2008)   TETANUS/TDAP  03/31/2023 (Originally [birth year]/1962)   Hepatitis C Screening  03/31/2023 (Originally 11/07/1963)   Zoster Vaccines- Shingrix  Completed   HPV VACCINES  Aged Out   COLONOSCOPY (Pts 45-28yr Insurance coverage will need to be confirmed)  Discontinued   Health Maintenance Health Maintenance Due  Topic Date Due   DEXA SCAN  Never done   Dexa Scan- ordered per consent.   Lung Cancer Screening: (Low Dose CT Chest recommended if Age 78-80years, 30 pack-year currently smoking OR have quit w/in 15years.) does not qualify.   Hepatitis C Screening: previously deferred.    Vision Screening: Recommended annual ophthalmology exams for early detection of glaucoma and other disorders of the eye.  Dental Screening: Recommended annual dental exams for proper oral hygiene  Community Resource Referral / Chronic Care Management: CRR required this visit?  No   CCM required this visit?  No      Plan:     I have personally reviewed and noted the following in the patient's chart:   Medical and social history Use of alcohol, tobacco or illicit drugs  Current medications and supplements including opioid prescriptions. Patient is not currently taking opioid prescriptions. Functional ability and status Nutritional status Physical activity Advanced directives List of other physicians Hospitalizations, surgeries, and ER visits in previous 12 months Vitals Screenings to include cognitive, depression, and falls Referrals and appointments  In addition, I have reviewed and discussed with patient certain preventive protocols, quality metrics, and best practice recommendations. A written personalized care plan for preventive services as well as general preventive health recommendations were provided to patient.     OVarney Biles LPN   [id number]

## 2022-06-06 NOTE — Patient Instructions (Addendum)
Jocelyn Case , Thank you for taking time to come for your Medicare Wellness Visit. I appreciate your ongoing commitment to your health goals. Please review the following plan we discussed and let me know if I can assist you in the future.   These are the goals we discussed:  Goals       Patient Stated     Maintain weight (pt-stated)      Weight goal 122lb        This is a list of the screening recommended for you and due dates:  Health Maintenance  Topic Date Due   DEXA scan (bone density measurement)  Never done   COVID-19 Vaccine (4 - Pfizer series) 06/22/2022*   Flu Shot  12/31/2022*   Pneumonia Vaccine (1 - PCV) 03/31/2023*   Tetanus Vaccine  03/31/2023*   Hepatitis C Screening: USPSTF Recommendation to screen - Ages 18-79 yo.  03/31/2023*   Zoster (Shingles) Vaccine  Completed   HPV Vaccine  Aged Out   Colon Cancer Screening  Discontinued  *Topic was postponed. The date shown is not the original due date.    Bone density- scheduling  [phone]    Preventive Care 65 Years and Older, Female Preventive care refers to lifestyle choices and visits with your health care provider that can promote health and wellness. What does preventive care include? A yearly physical exam. This is also called an annual well check. Dental exams once or twice a year. Routine eye exams. Ask your health care provider how often you should have your eyes checked. Personal lifestyle choices, including: Daily care of your teeth and gums. Regular physical activity. Eating a healthy diet. Avoiding tobacco and drug use. Limiting alcohol use. Practicing safe sex. Taking low-dose aspirin every day. Taking vitamin and mineral supplements as recommended by your health care provider. What happens during an annual well check? The services and screenings done by your health care provider during your annual well check will depend on your age, overall health, lifestyle risk factors, and family history of  disease. Counseling  Your health care provider may ask you questions about your: Alcohol use. Tobacco use. Drug use. Emotional well-being. Home and relationship well-being. Sexual activity. Eating habits. History of falls. Memory and ability to understand (cognition). Work and work Statistician. Reproductive health. Screening  You may have the following tests or measurements: Height, weight, and BMI. Blood pressure. Lipid and cholesterol levels. These may be checked every 5 years, or more frequently if you are over 44 years old. Skin check. Lung cancer screening. You may have this screening every year starting at age 65 if you have a 30-pack-year history of smoking and currently smoke or have quit within the past 15 years. Fecal occult blood test (FOBT) of the stool. You may have this test every year starting at age 46. Flexible sigmoidoscopy or colonoscopy. You may have a sigmoidoscopy every 5 years or a colonoscopy every 10 years starting at age 49. Hepatitis C blood test. Hepatitis B blood test. Sexually transmitted disease (STD) testing. Diabetes screening. This is done by checking your blood sugar (glucose) after you have not eaten for a while (fasting). You may have this done every 1-3 years. Bone density scan. This is done to screen for osteoporosis. You may have this done starting at age 42. Mammogram. This may be done every 1-2 years. Talk to your health care provider about how often you should have regular mammograms. Talk with your health care provider about your test results,  treatment options, and if necessary, the need for more tests. Vaccines  Your health care provider may recommend certain vaccines, such as: Influenza vaccine. This is recommended every year. Tetanus, diphtheria, and acellular pertussis (Tdap, Td) vaccine. You may need a Td booster every 10 years. Zoster vaccine. You may need this after age 59. Pneumococcal 13-valent conjugate (PCV13) vaccine. One  dose is recommended after age 59. Pneumococcal polysaccharide (PPSV23) vaccine. One dose is recommended after age 17. Talk to your health care provider about which screenings and vaccines you need and how often you need them. This information is not intended to replace advice given to you by your health care provider. Make sure you discuss any questions you have with your health care provider. Document Released: 10/15/2015 Document Revised: 06/07/2016 Document Reviewed: 07/20/2015 Elsevier Interactive Patient Education  2017 [place] Prevention in the Home Falls can cause injuries. They can happen to people of all ages. There are many things you can do to make your home safe and to help prevent falls. What can I do on the outside of my home? Regularly fix the edges of walkways and driveways and fix any cracks. Remove anything that might make you trip as you walk through a door, such as a raised step or threshold. Trim any bushes or trees on the path to your home. Use bright outdoor lighting. Clear any walking paths of anything that might make someone trip, such as rocks or tools. Regularly check to see if handrails are loose or broken. Make sure that both sides of any steps have handrails. Any raised decks and porches should have guardrails on the edges. Have any leaves, snow, or ice cleared regularly. Use sand or salt on walking paths during winter. Clean up any spills in your garage right away. This includes oil or grease spills. What can I do in the bathroom? Use night lights. Install grab bars by the toilet and in the tub and shower. Do not use towel bars as grab bars. Use non-skid mats or decals in the tub or shower. If you need to sit down in the shower, use a plastic, non-slip stool. Keep the floor dry. Clean up any water that spills on the floor as soon as it happens. Remove soap buildup in the tub or shower regularly. Attach bath mats securely with double-sided  non-slip rug tape. Do not have throw rugs and other things on the floor that can make you trip. What can I do in the bedroom? Use night lights. Make sure that you have a light by your bed that is easy to reach. Do not use any Case or blankets that are too big for your bed. They should not hang down onto the floor. Have a firm chair that has side arms. You can use this for support while you get dressed. Do not have throw rugs and other things on the floor that can make you trip. What can I do in the kitchen? Clean up any spills right away. Avoid walking on wet floors. Keep items that you use a lot in easy-to-reach places. If you need to reach something above you, use a strong step stool that has a grab bar. Keep electrical cords out of the way. Do not use floor polish or wax that makes floors slippery. If you must use wax, use non-skid floor wax. Do not have throw rugs and other things on the floor that can make you trip. What can I do with my stairs? Do  not leave any items on the stairs. Make sure that there are handrails on both sides of the stairs and use them. Fix handrails that are broken or loose. Make sure that handrails are as long as the stairways. Check any carpeting to make sure that it is firmly attached to the stairs. Fix any carpet that is loose or worn. Avoid having throw rugs at the top or bottom of the stairs. If you do have throw rugs, attach them to the floor with carpet tape. Make sure that you have a light switch at the top of the stairs and the bottom of the stairs. If you do not have them, ask someone to add them for you. What else can I do to help prevent falls? Wear shoes that: Do not have high heels. Have rubber bottoms. Are comfortable and fit you well. Are closed at the toe. Do not wear sandals. If you use a stepladder: Make sure that it is fully opened. Do not climb a closed stepladder. Make sure that both sides of the stepladder are locked into place. Ask  someone to hold it for you, if possible. Clearly mark and make sure that you can see: Any grab bars or handrails. First and last steps. Where the edge of each step is. Use tools that help you move around (mobility aids) if they are needed. These include: Canes. Walkers. Scooters. Crutches. Turn on the lights when you go into a dark area. Replace any light bulbs as soon as they burn out. Set up your furniture so you have a clear path. Avoid moving your furniture around. If any of your floors are uneven, fix them. If there are any pets around you, be aware of where they are. Review your medicines with your doctor. Some medicines can make you feel dizzy. This can increase your chance of falling. Ask your doctor what other things that you can do to help prevent falls. This information is not intended to replace advice given to you by your health care provider. Make sure you discuss any questions you have with your health care provider. Document Released: 07/15/2009 Document Revised: 02/24/2016 Document Reviewed: 10/23/2014 Elsevier Interactive Patient Education  2017 [employer].

## 2022-06-07 ENCOUNTER — Ambulatory Visit (INDEPENDENT_AMBULATORY_CARE_PROVIDER_SITE_OTHER): Payer: Medicare Other | Admitting: Internal Medicine

## 2022-06-07 DIAGNOSIS — R252 Cramp and spasm: Secondary | ICD-10-CM | POA: Diagnosis not present

## 2022-06-07 DIAGNOSIS — D500 Iron deficiency anemia secondary to blood loss (chronic): Secondary | ICD-10-CM

## 2022-06-07 LAB — CK: Total CK: 60 U/L (ref 7–177)

## 2022-06-07 LAB — BASIC METABOLIC PANEL
BUN: 34 mg/dL — ABNORMAL HIGH (ref 6–23)
CO2: 25 mEq/L (ref 19–32)
Calcium: 9.6 mg/dL (ref 8.4–10.5)
Chloride: 107 mEq/L (ref 96–112)
Creatinine, Ser: 0.91 mg/dL (ref 0.40–1.20)
GFR: 60.4 mL/min (ref >60.00)
Glucose, Bld: 84 mg/dL (ref 70–99)
Potassium: 5.2 mEq/L — ABNORMAL HIGH (ref 3.5–5.1)
Sodium: 141 mEq/L (ref 135–145)

## 2022-06-07 LAB — SEDIMENTATION RATE: Sed Rate: 13 mm/hr (ref 0–30)

## 2022-06-07 LAB — MAGNESIUM: Magnesium: 2.3 mg/dL (ref 1.5–2.5)

## 2022-06-07 NOTE — Progress Notes (Signed)
Patient ID: Jocelyn Case, female   DOB: [birth year], 76 y.o.   MRN: [id number]   Subjective:    Patient ID: Jocelyn Case, female    DOB: 1946-06-03, 76 y.o.   MRN: [id number]   Patient here for  Chief Complaint  Patient presents with   Leg Pain    C/O right leg pain since Friday night. Started with cramps. No fall or injury   .   HPI Work in appt - increased right foot cramps/persistent pain.  Has been seeing Dr Sharlet Salina for right low back pain/pain into her buttock/thigh. Is s/p trigger point injection - 05/02/22 and ESI 05/25/22.  Reports that 06/02/22 - lying in bed - her foot started drawing.  Increased muscle cramps.  Continues with intermittent cramps throughout the night.  Since, her ankle and leg have been sore.  Pain up into her thigh.  No injury or trauma.  Staying hydrated.  No fever.  No increased erythema.  Other than a little extra walking that day, no other new activity.     Past Medical History:  Diagnosis Date   Anemia    Arthritis    Blood transfusion without reported diagnosis    Complication of anesthesia    CPAP (continuous positive airway pressure) dependence    GERD (gastroesophageal reflux disease)    Incontinence    Migraine    PONV (postoperative nausea and vomiting)    Restless leg syndrome 2015   R leg    Sleep apnea    UTI (urinary tract infection)    Past Surgical History:  Procedure Laterality Date   ABDOMINAL HYSTERECTOMY     APPENDECTOMY     AUGMENTATION MAMMAPLASTY Bilateral 2671   silicone removed 2458/ right side ruptured   BREAST EXCISIONAL BIOPSY Right    neg   CHOLECYSTECTOMY     COLONOSCOPY  2 years ago   COLONOSCOPY WITH PROPOFOL N/A 01/10/2022   Procedure: COLONOSCOPY WITH PROPOFOL;  Surgeon: Lucilla Lame, MD;  Location: [facility];  Service: Endoscopy;  Laterality: N/A;   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   not digesting good, had to have work on duodenal per pt   ESOPHAGOGASTRODUODENOSCOPY (EGD) WITH PROPOFOL N/A 02/14/2018    Procedure: ESOPHAGOGASTRODUODENOSCOPY (EGD) WITH PROPOFOL;  Surgeon: Virgel Manifold, MD;  Location: [facility];  Service: Endoscopy;  Laterality: N/A;   ESOPHAGOGASTRODUODENOSCOPY (EGD) WITH PROPOFOL N/A 02/15/2018   Procedure: ESOPHAGOGASTRODUODENOSCOPY (EGD) WITH PROPOFOL;  Surgeon: Virgel Manifold, MD;  Location: [facility];  Service: Endoscopy;  Laterality: N/A;   ESOPHAGOGASTRODUODENOSCOPY (EGD) WITH PROPOFOL N/A 07/05/2018   Procedure: ESOPHAGOGASTRODUODENOSCOPY (EGD) WITH PROPOFOL;  Surgeon: Virgel Manifold, MD;  Location: [facility];  Service: Endoscopy;  Laterality: N/A;   STOMACH SURGERY  2013   UPPER GASTROINTESTINAL ENDOSCOPY  05/2017   pt stated "bleeding ulcer and stopped up duodenum was noted"   Family History  Problem Relation Age of Onset   Peptic Ulcer Disease Mother    Dementia Mother    COPD Father    Colon cancer Father    Social History   Socioeconomic History   Marital status: Widowed    Spouse name: Not on file   Number of children: Not on file   Years of education: Not on file   Highest education level: Not on file  Occupational History   Not on file  Tobacco Use   Smoking status: Former    Types: Cigarettes    Quit date: 10/03/1974    Years since quitting: 76.7  Smokeless tobacco: Never  Vaping Use   Vaping Use: Never used  Substance and Sexual Activity   Alcohol use: Yes    Comment: occasional   Drug use: No   Sexual activity: Never  Other Topics Concern   Not on file  Social History Narrative   Not on file   Social Determinants of Health   Financial Resource Strain: Low Risk  (06/06/2022)   Overall Financial Resource Strain (CARDIA)    Difficulty of Paying Living Expenses: Not hard at all  Food Insecurity: No Food Insecurity (06/06/2022)   Hunger Vital Sign    Worried About Running Out of Food in the Last Year: Never true    Ran Out of Food in the Last Year: Never true  Transportation Needs: No Transportation  Needs (06/06/2022)   PRAPARE - Hydrologist (Medical): No    Lack of Transportation (Non-Medical): No  Physical Activity: Insufficiently Active (06/06/2022)   Exercise Vital Sign    Days of Exercise per Week: 3 days    Minutes of Exercise per Session: 30 min  Stress: No Stress Concern Present (06/06/2022)   [place]    Feeling of Stress : Not at all  Social Connections: Unknown (06/06/2022)   Social Connection and Isolation Panel [NHANES]    Frequency of Communication with Friends and Family: More than three times a week    Frequency of Social Gatherings with Friends and Family: More than three times a week    Attends Religious Services: Not on Advertising copywriter or Organizations: Not on file    Attends Archivist Meetings: Not on file    Marital Status: Not on file     Review of Systems  Constitutional:  Negative for appetite change and unexpected weight change.  HENT:  Negative for congestion and sinus pressure.   Respiratory:  Negative for cough, chest tightness and shortness of breath.   Cardiovascular:  Negative for chest pain, palpitations and leg swelling.  Gastrointestinal:  Negative for abdominal pain, diarrhea, nausea and vomiting.  Genitourinary:  Negative for difficulty urinating and dysuria.  Musculoskeletal:  Negative for joint swelling and myalgias.       Muscle cramps as outlined.   Skin:  Negative for color change and rash.  Neurological:  Negative for dizziness, light-headedness and headaches.  Psychiatric/Behavioral:  Negative for agitation and dysphoric mood.        Objective:     BP 120/68   Pulse 78   Temp 98 F (36.7 C) (Oral)   Resp 17   Ht $R'5\' 2"'nO$  (1.575 m)   Wt 122 lb 2 oz (55.4 kg)   SpO2 98%   BMI 22.34 kg/m  Wt Readings from Last 3 Encounters:  06/07/22 122 lb 2 oz (55.4 kg)  06/06/22 122 lb 12.8 oz (55.7 kg)  03/30/22 123 lb  (55.8 kg)    Physical Exam Vitals reviewed.  Constitutional:      General: She is not in acute distress.    Appearance: Normal appearance.  HENT:     Head: Normocephalic and atraumatic.     Right Ear: External ear normal.     Left Ear: External ear normal.  Eyes:     General: No scleral icterus.       Right eye: No discharge.        Left eye: No discharge.     Conjunctiva/sclera: Conjunctivae normal.  Neck:     Thyroid: No thyromegaly.  Cardiovascular:     Rate and Rhythm: Normal rate and regular rhythm.  Pulmonary:     Effort: No respiratory distress.     Breath sounds: Normal breath sounds. No wheezing.  Abdominal:     General: Bowel sounds are normal.     Palpations: Abdomen is soft.     Tenderness: There is no abdominal tenderness.  Musculoskeletal:        General: No swelling or tenderness.     Cervical back: Neck supple. No tenderness.  Lymphadenopathy:     Cervical: No cervical adenopathy.  Skin:    Findings: No erythema or rash.  Neurological:     Mental Status: She is alert.  Psychiatric:        Mood and Affect: Mood normal.        Behavior: Behavior normal.      Outpatient Encounter Medications as of 06/07/2022  Medication Sig   acetaminophen (TYLENOL) 500 MG tablet Take 1 tablet by mouth in the morning and at bedtime.   Cholecalciferol (VITAMIN D3) 50 MCG (2000 UT) TABS Take 2,000 Units by mouth daily.   cyanocobalamin (,VITAMIN B-12,) 1000 MCG/ML injection Inject 1,000 mcg into the muscle every 30 (thirty) days.   Multiple Vitamin (MULTI-VITAMIN) tablet Take 1 tablet by mouth in the morning and at bedtime.   pantoprazole (PROTONIX) 40 MG tablet Take 1 tablet (40 mg total) by mouth daily.   No facility-administered encounter medications on file as of 06/07/2022.     Lab Results  Component Value Date   WBC 4.5 03/16/2022   HGB 10.8 (L) 03/16/2022   HCT 35.3 (L) 03/16/2022   PLT 243 03/16/2022   GLUCOSE 84 06/07/2022   ALT 28 07/05/2021   AST 27  07/05/2021   NA 141 06/07/2022   K 5.1 06/09/2022   CL 107 06/07/2022   CREATININE 0.91 06/07/2022   BUN 34 (H) 06/07/2022   CO2 25 06/07/2022   TSH 0.502 12/30/2020    MM DIAG BREAST TOMO UNI RIGHT  Result Date: 03/10/2022 CLINICAL DATA:  76 year old female presenting as a recall from screening for possible right breast calcifications. History of breast implants that have now been removed. History of right-sided breast implant rupture. EXAM: DIGITAL DIAGNOSTIC UNILATERAL RIGHT MAMMOGRAM WITH TOMOSYNTHESIS AND CAD TECHNIQUE: Right digital diagnostic mammography and breast tomosynthesis was performed. The images were evaluated with computer-aided detection. COMPARISON:  Previous exam(s). ACR Breast Density Category c: The breast tissue is heterogeneously dense, which may obscure small masses. FINDINGS: Spot 2D magnification views and full true lateral views of the right breast were performed demonstrating persistence of high density material within the medial aspect of the right breast but no true calcifications. The high-density material is for to represent free silicone from prior implant rupture. IMPRESSION: Probably benign asymmetry in the medial right breast favored to represent free silicone from prior implant rupture. RECOMMENDATION: Diagnostic right breast mammogram in 6 months. I have discussed the findings and recommendations with the patient. If applicable, a reminder letter will be sent to the patient regarding the next appointment. BI-RADS CATEGORY  3: Probably benign. Electronically Signed   By: Audie Pinto M.D.   On: 03/10/2022 16:37      Assessment & Plan:   Problem List Items Addressed This Visit     Iron deficiency anemia due to chronic blood loss    Receives iron infusions - hematology.  Has had GI evaluation as outlined.  Continue follow  up with hematology.       Muscle cramps    Has been seeing Dr Sharlet Salina for right low back pain/pain into her buttock/thigh. Is s/p  trigger point injection - 05/02/22 and ESI 05/25/22.  Reports that 06/02/22 - lying in bed - her foot started drawing.  Increased muscle cramps.  Continues with intermittent cramps throughout the night.  Since, her ankle and leg have been sore.  Pain up into her thigh.  No injury or trauma.  Staying hydrated. Discussed possible etiologies.  Discussed stretches.  Check electrolytes including calcium and magnesium.  Check ESR and CK.  Further w/up pending results.       Relevant Orders   Sedimentation rate (Completed)   Basic metabolic panel (Completed)   Magnesium (Completed)   CK (Creatine Kinase) (Completed)     Einar Pheasant, MD

## 2022-06-08 ENCOUNTER — Other Ambulatory Visit: Payer: Self-pay | Admitting: Internal Medicine

## 2022-06-08 DIAGNOSIS — E875 Hyperkalemia: Secondary | ICD-10-CM

## 2022-06-08 NOTE — Progress Notes (Signed)
Order placed for f/u lab.   

## 2022-06-09 ENCOUNTER — Other Ambulatory Visit
Admission: RE | Admit: 2022-06-09 | Discharge: 2022-06-09 | Disposition: A | Discharge status: Home / Self Care | Payer: Medicare Other | Attending: Internal Medicine | Admitting: Internal Medicine

## 2022-06-09 DIAGNOSIS — G4733 Obstructive sleep apnea (adult) (pediatric): Secondary | ICD-10-CM | POA: Diagnosis present

## 2022-06-09 DIAGNOSIS — R5383 Other fatigue: Secondary | ICD-10-CM

## 2022-06-09 DIAGNOSIS — E875 Hyperkalemia: Secondary | ICD-10-CM

## 2022-06-09 DIAGNOSIS — Z13220 Encounter for screening for lipoid disorders: Secondary | ICD-10-CM | POA: Diagnosis not present

## 2022-06-09 LAB — POTASSIUM: Potassium: 5.1 mmol/L (ref 3.5–5.1)

## 2022-06-09 NOTE — Addendum Note (Signed)
Addended by: Antonieta Iba C on: 06/09/2022 03:07 PM   Modules accepted: Orders

## 2022-06-09 NOTE — Addendum Note (Signed)
Addended by: Antonieta Iba C on: 06/09/2022 03:06 PM   Modules accepted: Orders

## 2022-06-09 NOTE — Addendum Note (Signed)
Addended by: Antonieta Iba C on: 06/09/2022 03:08 PM   Modules accepted: Orders

## 2022-06-11 ENCOUNTER — Other Ambulatory Visit: Payer: Self-pay | Admitting: Internal Medicine

## 2022-06-11 DIAGNOSIS — E875 Hyperkalemia: Secondary | ICD-10-CM

## 2022-06-11 NOTE — Progress Notes (Signed)
Order placed for potassium check.

## 2022-06-18 ENCOUNTER — Encounter: Payer: Self-pay | Admitting: Internal Medicine

## 2022-06-18 NOTE — Assessment & Plan Note (Signed)
Has been seeing Dr Sharlet Salina for right low back pain/pain into her buttock/thigh. Is s/p trigger point injection - 05/02/22 and ESI 05/25/22.  Reports that 06/02/22 - lying in bed - her foot started drawing.  Increased muscle cramps.  Continues with intermittent cramps throughout the night.  Since, her ankle and leg have been sore.  Pain up into her thigh.  No injury or trauma.  Staying hydrated. Discussed possible etiologies.  Discussed stretches.  Check electrolytes including calcium and magnesium.  Check ESR and CK.  Further w/up pending results.

## 2022-06-18 NOTE — Assessment & Plan Note (Signed)
Receives iron infusions - hematology.  Has had GI evaluation as outlined.  Continue follow up with hematology.  

## 2022-06-19 ENCOUNTER — Other Ambulatory Visit (INDEPENDENT_AMBULATORY_CARE_PROVIDER_SITE_OTHER): Payer: Medicare Other

## 2022-06-19 DIAGNOSIS — E875 Hyperkalemia: Secondary | ICD-10-CM | POA: Diagnosis not present

## 2022-06-19 LAB — POTASSIUM: Potassium: 5 mEq/L (ref 3.5–5.1)

## 2022-06-19 MED FILL — Iron Sucrose Inj 20 MG/ML (Fe Equiv): INTRAVENOUS | Qty: 10 | Status: AC

## 2022-06-20 ENCOUNTER — Inpatient Hospital Stay: Payer: Medicare Other

## 2022-06-20 ENCOUNTER — Encounter: Payer: Self-pay | Admitting: Oncology

## 2022-06-20 ENCOUNTER — Inpatient Hospital Stay (HOSPITAL_BASED_OUTPATIENT_CLINIC_OR_DEPARTMENT_OTHER): Payer: Medicare Other | Admitting: Oncology

## 2022-06-20 ENCOUNTER — Inpatient Hospital Stay: Payer: Medicare Other | Attending: Oncology

## 2022-06-20 VITALS — BP 109/47 | HR 75 | Temp 97.9°F | Resp 18 | Wt 123.8 lb

## 2022-06-20 DIAGNOSIS — E538 Deficiency of other specified B group vitamins: Secondary | ICD-10-CM | POA: Diagnosis present

## 2022-06-20 DIAGNOSIS — D7589 Other specified diseases of blood and blood-forming organs: Secondary | ICD-10-CM | POA: Diagnosis not present

## 2022-06-20 DIAGNOSIS — D500 Iron deficiency anemia secondary to blood loss (chronic): Secondary | ICD-10-CM

## 2022-06-20 LAB — CBC WITH DIFFERENTIAL/PLATELET
Abs Immature Granulocytes: 0.02 10*3/uL (ref 0.00–0.07)
Basophils Absolute: 0 10*3/uL (ref 0.0–0.1)
Basophils Relative: 0 %
Eosinophils Absolute: 0.2 10*3/uL (ref 0.0–0.5)
Eosinophils Relative: 4 %
HCT: 37.6 % (ref 36.0–46.0)
Hemoglobin: 11.6 g/dL — ABNORMAL LOW (ref 12.0–15.0)
Immature Granulocytes: 1 %
Lymphocytes Relative: 21 %
Lymphs Abs: 1 10*3/uL (ref 0.7–4.0)
MCH: 33.5 pg (ref 26.0–34.0)
MCHC: 30.9 g/dL (ref 30.0–36.0)
MCV: 108.7 fL — ABNORMAL HIGH (ref 80.0–100.0)
Monocytes Absolute: 0.4 10*3/uL (ref 0.1–1.0)
Monocytes Relative: 9 %
Neutro Abs: 2.9 10*3/uL (ref 1.7–7.7)
Neutrophils Relative %: 65 %
Platelets: 212 10*3/uL (ref 150–400)
RBC: 3.46 MIL/uL — ABNORMAL LOW (ref 3.87–5.11)
RDW: 13.2 % (ref 11.5–15.5)
WBC: 4.4 10*3/uL (ref 4.0–10.5)
nRBC: 0 % (ref 0.0–0.2)

## 2022-06-20 LAB — COMPREHENSIVE METABOLIC PANEL
ALT: 27 U/L (ref 0–44)
AST: 29 U/L (ref 15–41)
Albumin: 3.8 g/dL (ref 3.5–5.0)
Alkaline Phosphatase: 61 U/L (ref 38–126)
Anion gap: 6 (ref 5–15)
BUN: 43 mg/dL — ABNORMAL HIGH (ref 8–23)
CO2: 23 mmol/L (ref 22–32)
Calcium: 8.6 mg/dL — ABNORMAL LOW (ref 8.9–10.3)
Chloride: 112 mmol/L — ABNORMAL HIGH (ref 98–111)
Creatinine, Ser: 0.83 mg/dL (ref 0.44–1.00)
GFR, Estimated: >60 mL/min (ref >60)
Glucose, Bld: 87 mg/dL (ref 70–99)
Potassium: 4.3 mmol/L (ref 3.5–5.1)
Sodium: 141 mmol/L (ref 135–145)
Total Bilirubin: 0.5 mg/dL (ref 0.3–1.2)
Total Protein: 6.7 g/dL (ref 6.5–8.1)

## 2022-06-20 LAB — FOLATE: Folate: 31 ng/mL (ref >5.9)

## 2022-06-20 LAB — IRON AND TIBC
Iron: 59 ug/dL (ref 28–170)
Saturation Ratios: 17 % (ref 10.4–31.8)
TIBC: 350 ug/dL (ref 250–450)
UIBC: 291 ug/dL

## 2022-06-20 LAB — VITAMIN B12: Vitamin B-12: 450 pg/mL (ref 180–914)

## 2022-06-20 LAB — FERRITIN: Ferritin: 141 ng/mL (ref 11–307)

## 2022-06-20 MED ORDER — CYANOCOBALAMIN 1000 MCG/ML IJ SOLN
1000.0000 ug | Freq: Once | INTRAMUSCULAR | Status: AC
  Administered 2022-06-20: 1000 ug via INTRAMUSCULAR
  Filled 2022-06-20: qty 1

## 2022-06-20 NOTE — Progress Notes (Signed)
Pt here for follow up. No new concerns voiced.   

## 2022-06-20 NOTE — Progress Notes (Signed)
Hematology/Oncology Progress note [phone] [phone]      Clinic Day:  06/20/2022  Referring physician: [name], MD  Chief Complaint: Jocelyn Case is a 76 y.o. female presents for follow up of B12 deficiency and iron deficiency, s/p partial gastrectomy in 2009  [place]  Patient previously followed up by Dr.Corcoran, patient switched care to me on 03/31/21 Extensive medical record review was performed by me  2008 history including partial gastrectomy and reported Bilroth II reconstruction (unclear if vagotomy was performed). On EGD she also has an additional small bowel anastomosis. 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  Her symptoms were worsening since removal of the stent and had daily emesis and decreased oral intake. 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  INTERVAL HISTORY Jocelyn Case is a 76 y.o. female who has above history reviewed by me today presents for follow up visit for management of iron deficiency anemia, B12 deficiency. Patient reports fatigue is slightly better  Otherwise no new complaints. Denies any active bleeding events.  Past Medical History:  Diagnosis Date   Anemia    Arthritis    Blood transfusion without reported diagnosis    Complication of anesthesia    CPAP (continuous positive airway pressure) dependence    GERD (gastroesophageal reflux disease)    Incontinence    Migraine    PONV (postoperative nausea and vomiting)    Restless leg syndrome 2015   R leg    Sleep apnea    UTI (urinary tract infection)     Past Surgical History:  Procedure Laterality Date   ABDOMINAL HYSTERECTOMY     APPENDECTOMY     AUGMENTATION MAMMAPLASTY Bilateral 7824   silicone  removed 2353/ right side ruptured   BREAST EXCISIONAL BIOPSY Right    neg   CHOLECYSTECTOMY     COLONOSCOPY  2 years ago   COLONOSCOPY WITH PROPOFOL N/A 01/10/2022   Procedure: COLONOSCOPY WITH PROPOFOL;  Surgeon: Lucilla Lame, MD;  Location: [facility] ENDOSCOPY;  Service: Endoscopy;  Laterality: N/A;   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   not digesting good, had to have work on duodenal per pt   ESOPHAGOGASTRODUODENOSCOPY (EGD) WITH PROPOFOL N/A 02/14/2018   Procedure: ESOPHAGOGASTRODUODENOSCOPY (EGD) WITH PROPOFOL;  Surgeon: Virgel Manifold, MD;  Location: [facility] ENDOSCOPY;  Service: Endoscopy;  Laterality: N/A;   ESOPHAGOGASTRODUODENOSCOPY (EGD) WITH PROPOFOL N/A 02/15/2018   Procedure: ESOPHAGOGASTRODUODENOSCOPY (EGD) WITH PROPOFOL;  Surgeon: Virgel Manifold, MD;  Location: [facility];  Service: Endoscopy;  Laterality: N/A;   ESOPHAGOGASTRODUODENOSCOPY (EGD) WITH PROPOFOL N/A 07/05/2018   Procedure: ESOPHAGOGASTRODUODENOSCOPY (EGD) WITH PROPOFOL;  Surgeon: Virgel Manifold, MD;  Location: [facility];  Service: Endoscopy;  Laterality: N/A;   STOMACH SURGERY  2013   UPPER GASTROINTESTINAL ENDOSCOPY  05/2017   pt stated "bleeding ulcer and stopped up duodenum was noted"    Family History  Problem Relation Age of Onset   Peptic Ulcer Disease Mother    Dementia Mother    COPD Father    Colon cancer Father     Social History:  reports that she quit smoking about 47 years ago. Her smoking use included cigarettes. She has never used smokeless tobacco. She reports current alcohol use. She reports that she does not use drugs.   Allergies:  Allergies  Allergen Reactions   Solifenacin Rash  Morphine Nausea And Vomiting    Current Medications: Current Outpatient Medications  Medication Sig Dispense Refill   acetaminophen (TYLENOL) 500 MG tablet Take 1 tablet by mouth in the morning and at bedtime.     Cholecalciferol (VITAMIN D3) 50 MCG (2000 UT) TABS Take 2,000 Units  by mouth daily.     cyanocobalamin (,VITAMIN B-12,) 1000 MCG/ML injection Inject 1,000 mcg into the muscle every 30 (thirty) days.     Multiple Vitamin (MULTI-VITAMIN) tablet Take 1 tablet by mouth in the morning and at bedtime.     pantoprazole (PROTONIX) 40 MG tablet Take 1 tablet (40 mg total) by mouth daily. 90 tablet 2   No current facility-administered medications for this visit.     Performance status (ECOG): 0-1  Vitals Blood pressure (!) 109/47, pulse 75, temperature 97.9 F (36.6 C), resp. rate 18, weight 123 lb 12.8 oz (56.2 kg).   Physical Exam Constitutional:      General: She is not in acute distress.    Appearance: She is not diaphoretic.  HENT:     Head: Normocephalic and atraumatic.     Nose: Nose normal.     Mouth/Throat:     Pharynx: No oropharyngeal exudate.  Eyes:     General: No scleral icterus.    Pupils: Pupils are equal, round, and reactive to light.  Cardiovascular:     Rate and Rhythm: Normal rate and regular rhythm.     Heart sounds: No murmur heard. Pulmonary:     Effort: Pulmonary effort is normal. No respiratory distress.     Breath sounds: No rales.  Chest:     Chest wall: No tenderness.  Abdominal:     General: There is no distension.     Palpations: Abdomen is soft.     Tenderness: There is no abdominal tenderness.  Musculoskeletal:        General: Normal range of motion.     Cervical back: Normal range of motion and neck supple.  Skin:    General: Skin is warm and dry.     Findings: No erythema.  Neurological:     Mental Status: She is alert and oriented to person, place, and time.     Cranial Nerves: No cranial nerve deficit.     Motor: No abnormal muscle tone.     Coordination: Coordination normal.  Psychiatric:        Mood and Affect: Mood and affect normal.     Appointment on 06/20/2022  Component Date Value Ref Range Status   Sodium 06/20/2022 141  135 - 145 mmol/L Final   Potassium 06/20/2022 4.3  3.5 - 5.1 mmol/L  Final   Chloride 06/20/2022 112 (H)  98 - 111 mmol/L Final   CO2 06/20/2022 23  22 - 32 mmol/L Final   Glucose, Bld 06/20/2022 87  70 - 99 mg/dL Final   Glucose reference range applies only to samples taken after fasting for at least 8 hours.   BUN 06/20/2022 43 (H)  8 - 23 mg/dL Final   Creatinine, Ser 06/20/2022 0.83  0.44 - 1.00 mg/dL Final   Calcium 06/20/2022 8.6 (L)  8.9 - 10.3 mg/dL Final   Total Protein 06/20/2022 6.7  6.5 - 8.1 g/dL Final   Albumin 06/20/2022 3.8  3.5 - 5.0 g/dL Final   AST 06/20/2022 29  15 - 41 U/L Final   ALT 06/20/2022 27  0 - 44 U/L Final   Alkaline Phosphatase 06/20/2022 61  38 - 126 U/L  Final   Total Bilirubin 06/20/2022 0.5  0.3 - 1.2 mg/dL Final   GFR, Estimated 06/20/2022 >60  >60 mL/min Final   Comment: (NOTE) Calculated using the CKD-EPI Creatinine Equation (2021)    Anion gap 06/20/2022 6  5 - 15 Final   Performed at [facility], [address] [place], [place] [address]   Folate 06/20/2022 31.0  >5.9 ng/mL Final   Performed at [facility], [address] [place], [place] [address]   Iron 06/20/2022 59  28 - 170 ug/dL Final   TIBC 06/20/2022 350  250 - 450 ug/dL Final   Saturation Ratios 06/20/2022 17  10.4 - 31.8 % Final   UIBC 06/20/2022 291  ug/dL Final   Performed at [facility], [address] [place], [place] [address]   Ferritin 06/20/2022 141  11 - 307 ng/mL Final   Performed at [facility], [address] [place], [place] [address]   WBC 06/20/2022 4.4  4.0 - 10.5 K/uL Final   RBC 06/20/2022 3.46 (L)  3.87 - 5.11 MIL/uL Final   Hemoglobin 06/20/2022 11.6 (L)  12.0 - 15.0 g/dL Final   HCT 06/20/2022 37.6  36.0 - 46.0 % Final   MCV 06/20/2022 108.7 (H)  80.0 - 100.0 fL Final   MCH 06/20/2022 33.5  26.0 - 34.0 pg Final   MCHC 06/20/2022 30.9  30.0 - 36.0 g/dL Final   RDW 06/20/2022 13.2  11.5 - 15.5 % Final   Platelets 06/20/2022 212  150 - 400 K/uL Final   nRBC 06/20/2022 0.0  0.0 - 0.2 %  Final   Neutrophils Relative % 06/20/2022 65  % Final   Neutro Abs 06/20/2022 2.9  1.7 - 7.7 K/uL Final   Lymphocytes Relative 06/20/2022 21  % Final   Lymphs Abs 06/20/2022 1.0  0.7 - 4.0 K/uL Final   Monocytes Relative 06/20/2022 9  % Final   Monocytes Absolute 06/20/2022 0.4  0.1 - 1.0 K/uL Final   Eosinophils Relative 06/20/2022 4  % Final   Eosinophils Absolute 06/20/2022 0.2  0.0 - 0.5 K/uL Final   Basophils Relative 06/20/2022 0  % Final   Basophils Absolute 06/20/2022 0.0  0.0 - 0.1 K/uL Final   Immature Granulocytes 06/20/2022 1  % Final   Abs Immature Granulocytes 06/20/2022 0.02  0.00 - 0.07 K/uL Final   Performed at [facility], [address] [place],  [address]  Lab on 06/19/2022  Component Date Value Ref Range Status   Potassium 06/19/2022 5.0  3.5 - 5.1 mEq/L Final    Assessment and plan 1. Iron deficiency anemia due to chronic blood loss   2. B12 deficiency   3. Macrocytosis    #History of iron deficiency in the context of previous partial gastrectomy in 2009 followed by gastrojejunostomy on 03/23/2020.  Labs are reviewed. Hemoglobin has improved.  Adequate iron store. Hold IV venofer  #Vitamin B12 deficiency, Continue monthly vitamin B12 injections. B12 level is pending  # Chronic macrocytosis.  Continue monitor counts. Hemoglobin has improved.   Follow-up plan B12 monthly x 5 Lab in 6 months, iron labs, B12, folate, cmp prior to MD + B12 inj/ Venofer.   I discussed the assessment and treatment plan with the patient.  The patient was provided an opportunity to ask questions and all were answered.  The patient agreed with the plan and demonstrated an understanding of the instructions.  The patient was advised to call back if the symptoms  worsen or if the condition fails to improve as anticipated.  Earlie Server, MD, PhD [facility] Hematology Oncology 06/20/2022

## 2022-06-30 ENCOUNTER — Telehealth: Payer: Self-pay | Admitting: Internal Medicine

## 2022-06-30 ENCOUNTER — Other Ambulatory Visit: Payer: Medicare Other

## 2022-06-30 NOTE — Telephone Encounter (Signed)
Patient has a lab appt 07/07/2022, there are no orders in.

## 2022-07-02 ENCOUNTER — Encounter: Payer: Self-pay | Admitting: *Deleted

## 2022-07-02 NOTE — Telephone Encounter (Signed)
Please call Jocelyn Case.  She had had a f/u potassium check and has had labs since with Dr Tasia Catchings and potassium wnl.  Does not need this appt.  Please cancel and notify her she does not need to come.  Will forward copy to lab as well, so they are aware.

## 2022-07-02 NOTE — Telephone Encounter (Signed)
Sent mychart message-will call if not read by Tuesday

## 2022-07-07 ENCOUNTER — Other Ambulatory Visit: Payer: Medicare Other

## 2022-07-20 ENCOUNTER — Inpatient Hospital Stay: Payer: Medicare Other | Attending: Oncology

## 2022-07-20 DIAGNOSIS — D500 Iron deficiency anemia secondary to blood loss (chronic): Secondary | ICD-10-CM

## 2022-07-20 DIAGNOSIS — E538 Deficiency of other specified B group vitamins: Secondary | ICD-10-CM | POA: Insufficient documentation

## 2022-07-20 MED ORDER — CYANOCOBALAMIN 1000 MCG/ML IJ SOLN
1000.0000 ug | Freq: Once | INTRAMUSCULAR | Status: AC
  Administered 2022-07-20: 1000 ug via INTRAMUSCULAR
  Filled 2022-07-20: qty 1

## 2022-07-27 ENCOUNTER — Ambulatory Visit
Admission: RE | Admit: 2022-07-27 | Discharge: 2022-07-27 | Disposition: A | Discharge status: Home / Self Care | Payer: Medicare Other | Source: Ambulatory Visit | Attending: Internal Medicine | Admitting: Internal Medicine

## 2022-07-27 DIAGNOSIS — Z780 Asymptomatic menopausal state: Secondary | ICD-10-CM | POA: Diagnosis not present

## 2022-07-31 ENCOUNTER — Encounter: Payer: Self-pay | Admitting: Internal Medicine

## 2022-07-31 ENCOUNTER — Ambulatory Visit (INDEPENDENT_AMBULATORY_CARE_PROVIDER_SITE_OTHER): Payer: Medicare Other | Admitting: Internal Medicine

## 2022-07-31 VITALS — BP 124/60 | HR 83 | Temp 97.6°F | Resp 16 | Ht 62.0 in | Wt 123.4 lb

## 2022-07-31 DIAGNOSIS — M545 Low back pain, unspecified: Secondary | ICD-10-CM

## 2022-07-31 DIAGNOSIS — K219 Gastro-esophageal reflux disease without esophagitis: Secondary | ICD-10-CM

## 2022-07-31 DIAGNOSIS — E538 Deficiency of other specified B group vitamins: Secondary | ICD-10-CM

## 2022-07-31 DIAGNOSIS — G4733 Obstructive sleep apnea (adult) (pediatric): Secondary | ICD-10-CM

## 2022-07-31 DIAGNOSIS — M810 Age-related osteoporosis without current pathological fracture: Secondary | ICD-10-CM | POA: Diagnosis not present

## 2022-07-31 DIAGNOSIS — D500 Iron deficiency anemia secondary to blood loss (chronic): Secondary | ICD-10-CM

## 2022-07-31 NOTE — Patient Instructions (Signed)
Oscal '500mg'$  plus D - take one tablet twice a day or caltrate '600mg'$  plus d take one tablet twice a day.

## 2022-07-31 NOTE — Progress Notes (Signed)
Patient ID: Jocelyn Case, female   DOB: 1946/08/28, 76 y.o.   MRN: [id number]   Subjective:    Patient ID: Jocelyn Case, female    DOB: 06-20-1946, 76 y.o.   MRN: [id number]   Patient here for  Chief Complaint  Patient presents with   Follow-up    74mofollow up   .   HPI Here to follow up regarding previous pain and cramps.  Has seen Dr CSharlet Salina  S/p trigger point injection 05/02/22 and ESI 05/25/22 - right low back/buttock pain. Had f/u 06/21/22 -  doing better.  Recommended continuing tramadol. Here to discuss recent bone density and treatment.  Bone density revealed osteoporosis.  Discussed treatment - including oral bisphosphonates, reclast and prolia.  She is interested in reclast.  Seeing Dr YTasia Catchingsfor IDA.  Also continues monthly B12 injections.  Watching potassium in diet.  Tries to stay active.  No chest pain or sob reported.    Past Medical History:  Diagnosis Date   Anemia    Arthritis    Blood transfusion without reported diagnosis    Complication of anesthesia    CPAP (continuous positive airway pressure) dependence    GERD (gastroesophageal reflux disease)    Incontinence    Migraine    PONV (postoperative nausea and vomiting)    Restless leg syndrome 2015   R leg    Sleep apnea    UTI (urinary tract infection)    Past Surgical History:  Procedure Laterality Date   ABDOMINAL HYSTERECTOMY     APPENDECTOMY     AUGMENTATION MAMMAPLASTY Bilateral 15038  silicone removed 28828/right side ruptured   BREAST EXCISIONAL BIOPSY Right    neg   CHOLECYSTECTOMY     COLONOSCOPY  2 years ago   COLONOSCOPY WITH PROPOFOL N/A 01/10/2022   Procedure: COLONOSCOPY WITH PROPOFOL;  Surgeon: WLucilla Lame MD;  Location: [facility] END[facility]COPY;  Service: Endoscopy;  Laterality: N/A;   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   not digesting good, had to have work on duodenal per pt   ESOPHAGOGASTRODUODENOSCOPY (EGD) WITH PROPOFOL N/A 02/14/2018   Procedure: ESOPHAGOGASTRODUODENOSCOPY (EGD) WITH PROPOFOL;   Surgeon: TVirgel Manifold MD;  Location: [facility];  Service: Endoscopy;  Laterality: N/A;   ESOPHAGOGASTRODUODENOSCOPY (EGD) WITH PROPOFOL N/A 02/15/2018   Procedure: ESOPHAGOGASTRODUODENOSCOPY (EGD) WITH PROPOFOL;  Surgeon: TVirgel Manifold MD;  Location: [facility];  Service: Endoscopy;  Laterality: N/A;   ESOPHAGOGASTRODUODENOSCOPY (EGD) WITH PROPOFOL N/A 07/05/2018   Procedure: ESOPHAGOGASTRODUODENOSCOPY (EGD) WITH PROPOFOL;  Surgeon: TVirgel Manifold MD;  Location: [facility];  Service: Endoscopy;  Laterality: N/A;   STOMACH SURGERY  2013   UPPER GASTROINTESTINAL ENDOSCOPY  05/2017   pt stated "bleeding ulcer and stopped up duodenum was noted"   Family History  Problem Relation Age of Onset   Peptic Ulcer Disease Mother    Dementia Mother    COPD Father    Colon cancer Father    Social History   Socioeconomic History   Marital status: Widowed    Spouse name: Not on file   Number of children: Not on file   Years of education: Not on file   Highest education level: Not on file  Occupational History   Not on file  Tobacco Use   Smoking status: Former    Types: Cigarettes    Quit date: 10/03/1974    Years since quitting: 47.8   Smokeless tobacco: Never  Vaping Use   Vaping Use: Never used  Substance and Sexual  Activity   Alcohol use: Yes    Comment: occasional   Drug use: No   Sexual activity: Never  Other Topics Concern   Not on file  Social History Narrative   Not on file   Social Determinants of Health   Financial Resource Strain: Low Risk  (06/06/2022)   Overall Financial Resource Strain (CARDIA)    Difficulty of Paying Living Expenses: Not hard at all  Food Insecurity: No Food Insecurity (06/06/2022)   Hunger Vital Sign    Worried About Running Out of Food in the Last Year: Never true    Ran Out of Food in the Last Year: Never true  Transportation Needs: No Transportation Needs (06/06/2022)   PRAPARE - Radiographer, therapeutic (Medical): No    Lack of Transportation (Non-Medical): No  Physical Activity: Insufficiently Active (06/06/2022)   Exercise Vital Sign    Days of Exercise per Week: 3 days    Minutes of Exercise per Session: 30 min  Stress: No Stress Concern Present (06/06/2022)   [place]    Feeling of Stress : Not at all  Social Connections: Unknown (06/06/2022)   Social Connection and Isolation Panel [NHANES]    Frequency of Communication with Friends and Family: More than three times a week    Frequency of Social Gatherings with Friends and Family: More than three times a week    Attends Religious Services: Not on Advertising copywriter or Organizations: Not on file    Attends Archivist Meetings: Not on file    Marital Status: Not on file     Review of Systems  Constitutional:  Negative for appetite change and unexpected weight change.  HENT:  Negative for congestion and sinus pressure.   Respiratory:  Negative for cough, chest tightness and shortness of breath.   Cardiovascular:  Negative for chest pain, palpitations and leg swelling.  Gastrointestinal:  Negative for abdominal pain, diarrhea, nausea and vomiting.  Genitourinary:  Negative for difficulty urinating and dysuria.  Musculoskeletal:  Negative for joint swelling and myalgias.  Skin:  Negative for color change and rash.  Neurological:  Negative for dizziness, light-headedness and headaches.  Psychiatric/Behavioral:  Negative for agitation and dysphoric mood.        Objective:     BP 124/60 (BP Location: Left Arm, Patient Position: Sitting, Cuff Size: Small)   Pulse 83   Temp 97.6 F (36.4 C) (Temporal)   Resp 16   Ht '5\' 2"'$  (1.575 m)   Wt 123 lb 6.4 oz (56 kg)   SpO2 97%   BMI 22.57 kg/m  Wt Readings from Last 3 Encounters:  07/31/22 123 lb 6.4 oz (56 kg)  06/20/22 123 lb 12.8 oz (56.2 kg)  06/07/22 122 lb 2 oz (55.4 kg)     Physical Exam Vitals reviewed.  Constitutional:      General: She is not in acute distress.    Appearance: Normal appearance.  HENT:     Head: Normocephalic and atraumatic.     Right Ear: External ear normal.     Left Ear: External ear normal.  Eyes:     General: No scleral icterus.       Right eye: No discharge.        Left eye: No discharge.     Conjunctiva/sclera: Conjunctivae normal.  Neck:     Thyroid: No thyromegaly.  Cardiovascular:  Rate and Rhythm: Normal rate and regular rhythm.  Pulmonary:     Effort: No respiratory distress.     Breath sounds: Normal breath sounds. No wheezing.  Abdominal:     General: Bowel sounds are normal.     Palpations: Abdomen is soft.     Tenderness: There is no abdominal tenderness.  Musculoskeletal:        General: No swelling or tenderness.     Cervical back: Neck supple. No tenderness.  Lymphadenopathy:     Cervical: No cervical adenopathy.  Skin:    Findings: No erythema or rash.  Neurological:     Mental Status: She is alert.  Psychiatric:        Mood and Affect: Mood normal.        Behavior: Behavior normal.      Outpatient Encounter Medications as of 07/31/2022  Medication Sig   acetaminophen (TYLENOL) 500 MG tablet Take 1 tablet by mouth in the morning and at bedtime.   Cholecalciferol (VITAMIN D3) 50 MCG (2000 UT) TABS Take 2,000 Units by mouth daily.   cyanocobalamin (,VITAMIN B-12,) 1000 MCG/ML injection Inject 1,000 mcg into the muscle every 30 (thirty) days.   Multiple Vitamin (MULTI-VITAMIN) tablet Take 1 tablet by mouth in the morning and at bedtime.   pantoprazole (PROTONIX) 40 MG tablet Take 1 tablet (40 mg total) by mouth daily.   traMADol (ULTRAM) 50 MG tablet Take 50 mg by mouth 2 (two) times daily as needed.   No facility-administered encounter medications on file as of 07/31/2022.     Lab Results  Component Value Date   WBC 4.4 06/20/2022   HGB 11.6 (L) 06/20/2022   HCT 37.6 06/20/2022   PLT  212 06/20/2022   GLUCOSE 87 06/20/2022   ALT 27 06/20/2022   AST 29 06/20/2022   NA 141 06/20/2022   K 4.3 06/20/2022   CL 112 (H) 06/20/2022   CREATININE 0.83 06/20/2022   BUN 43 (H) 06/20/2022   CO2 23 06/20/2022   TSH 0.502 12/30/2020    DEXAScan  Result Date: 07/27/2022 EXAM: DUAL X-RAY ABSORPTIOMETRY (DXA) FOR BONE MINERAL DENSITY IMPRESSION: Your patient Jocelyn Case completed a BMD test on 07/27/2022 using the [place] (software version: 14.10) manufactured by [employer]. The following summarizes the results of our evaluation. Technologist:VLM PATIENT BIOGRAPHICAL: Name: Jocelyn, Case Patient ID: [id number] Birth Date: December 03, 1945 Height: 61.0 in. Gender: Female Exam Date: 07/27/2022 Weight: 122.0 lbs. Indications: Advanced Age, Caucasian, History of Fracture (Adult), Postmenopausal Fractures: Ankle Right Treatments: Protonix, Vitamin D DENSITOMETRY RESULTS: Site         Region     Measured Date Measured Age WHO Classification Young Adult T-score BMD         %Change vs. Previous Significant Change (*) DualFemur Neck Right 07/27/20[age].7 Osteoporosis -3.1 0.611 g/cm2 - - Left Forearm Radius 33% 07/27/2022 78.7 Osteoporosis -4.2 0.512 g/cm2 - - ASSESSMENT: The BMD measured at Forearm Radius 33% is 0.512 g/cm2 with a T-score of -4.2. This patient is considered osteoporotic according to [facility]) criteria. Lumbar spine was not utilized due to scoliosis. The scan quality is good. World Health Organization [facility]) criteria for post-menopausal, Caucasian Women: Normal:                   T-score at or above -1 SD Osteopenia/low bone mass: T-score between -1 and -2.5 SD Osteoporosis:             T-score  at or below -2.5 SD RECOMMENDATIONS: 1. All patients should optimize calcium and vitamin D intake. 2. Consider FDA-approved medical therapies in postmenopausal women and men aged 43 years and older, based on the following: a. A hip or vertebral(clinical or  morphometric) fracture b. T-score < -2.5 at the femoral neck or spine after appropriate evaluation to exclude secondary causes c. Low bone mass (T-score between -1.0 and -2.5 at the femoral neck or spine) and a 10-year probability of a hip fracture > 3% or a 10-year probability of a major osteoporosis-related fracture > 20% based on the [place]-adapted WHO algorithm 3. Clinician judgment and/or patient preferences may indicate treatment for people with 10-year fracture probabilities above or below these levels FOLLOW-UP: People with diagnosed cases of osteoporosis or at high risk for fracture should have regular bone mineral density tests. For patients eligible for Medicare, routine testing is allowed once every 2 years. The testing frequency can be increased to one year for patients who have rapidly progressing disease, those who are receiving or discontinuing medical therapy to restore bone mass, or have additional risk factors. I have reviewed this report, and agree with the above findings. [facility] Electronically Signed   By: Franki Cabot M.D.   On: 07/27/2022 09:48       Assessment & Plan:   Problem List Items Addressed This Visit     B12 deficiency    Continue b12 injections.       GERD (gastroesophageal reflux disease)    Extensive GI issues/procedures, etc - as outlined.  Seeing Dr Allen Norris.  On protonix.  Appears to be stable currently.  Follow.       Iron deficiency anemia due to chronic blood loss    Receives iron infusions - hematology.  Has had GI evaluation as outlined.  Continue follow up with hematology.       Low back pain      Has seen Dr Sharlet Salina.  S/p trigger point injection 05/02/22 and ESI 05/25/22 - right low back/buttock pain. Had f/u 06/21/22 -  doing better.  Recommended continuing tramadol.      Relevant Medications   traMADol (ULTRAM) 50 MG tablet   OSA on CPAP    Continue cpap.  Follow.       Osteoporosis - Primary    Recent bone density with  osteoporosis.  Discussed treatment options.  Hold on oral bisphosphonates given GI history.  Interested in [place].  Calcium, vitamin D and weight bearing exercise.  Refer for IV reclast.       Relevant Orders   Ambulatory referral to Endocrinology     Einar Pheasant, MD

## 2022-08-06 ENCOUNTER — Encounter: Payer: Self-pay | Admitting: Internal Medicine

## 2022-08-06 DIAGNOSIS — M810 Age-related osteoporosis without current pathological fracture: Secondary | ICD-10-CM | POA: Insufficient documentation

## 2022-08-06 NOTE — Assessment & Plan Note (Signed)
Continue cpap.  Follow.

## 2022-08-06 NOTE — Assessment & Plan Note (Signed)
Recent bone density with osteoporosis.  Discussed treatment options.  Hold on oral bisphosphonates given GI history.  Interested in [place].  Calcium, vitamin D and weight bearing exercise.  Refer for IV reclast.

## 2022-08-06 NOTE — Assessment & Plan Note (Signed)
Extensive GI issues/procedures, etc - as outlined.  Seeing Dr Allen Norris.  On protonix.  Appears to be stable currently.  Follow.

## 2022-08-06 NOTE — Assessment & Plan Note (Signed)
Continue b12 injections.  

## 2022-08-06 NOTE — Assessment & Plan Note (Signed)
Has seen Dr Sharlet Salina.  S/p trigger point injection 05/02/22 and ESI 05/25/22 - right low back/buttock pain. Had f/u 06/21/22 -  doing better.  Recommended continuing tramadol.

## 2022-08-06 NOTE — Assessment & Plan Note (Signed)
Receives iron infusions - hematology.  Has had GI evaluation as outlined.  Continue follow up with hematology.

## 2022-08-21 ENCOUNTER — Inpatient Hospital Stay: Payer: Medicare Other | Attending: Oncology

## 2022-08-21 DIAGNOSIS — D500 Iron deficiency anemia secondary to blood loss (chronic): Secondary | ICD-10-CM

## 2022-08-21 DIAGNOSIS — E538 Deficiency of other specified B group vitamins: Secondary | ICD-10-CM | POA: Insufficient documentation

## 2022-08-21 MED ORDER — CYANOCOBALAMIN 1000 MCG/ML IJ SOLN
1000.0000 ug | Freq: Once | INTRAMUSCULAR | Status: AC
  Administered 2022-08-21: 1000 ug via INTRAMUSCULAR
  Filled 2022-08-21: qty 1

## 2022-09-18 ENCOUNTER — Ambulatory Visit
Admission: RE | Admit: 2022-09-18 | Discharge: 2022-09-18 | Disposition: A | Discharge status: Home / Self Care | Payer: Medicare Other | Source: Ambulatory Visit | Attending: Internal Medicine | Admitting: Internal Medicine

## 2022-09-18 DIAGNOSIS — R928 Other abnormal and inconclusive findings on diagnostic imaging of breast: Secondary | ICD-10-CM | POA: Diagnosis present

## 2022-09-18 DIAGNOSIS — T8543XS Leakage of breast prosthesis and implant, sequela: Secondary | ICD-10-CM

## 2022-09-19 ENCOUNTER — Inpatient Hospital Stay: Payer: Medicare Other | Attending: Oncology

## 2022-09-19 VITALS — BP 117/61 | HR 76 | Resp 17

## 2022-09-19 DIAGNOSIS — E538 Deficiency of other specified B group vitamins: Secondary | ICD-10-CM | POA: Diagnosis not present

## 2022-09-19 DIAGNOSIS — D500 Iron deficiency anemia secondary to blood loss (chronic): Secondary | ICD-10-CM

## 2022-09-19 MED ORDER — CYANOCOBALAMIN 1000 MCG/ML IJ SOLN
1000.0000 ug | Freq: Once | INTRAMUSCULAR | Status: AC
  Administered 2022-09-19: 1000 ug via INTRAMUSCULAR
  Filled 2022-09-19: qty 1

## 2022-09-21 ENCOUNTER — Other Ambulatory Visit: Payer: Self-pay | Admitting: Gastroenterology

## 2022-10-20 ENCOUNTER — Inpatient Hospital Stay: Payer: Medicare Other | Attending: Oncology

## 2022-10-20 DIAGNOSIS — E538 Deficiency of other specified B group vitamins: Secondary | ICD-10-CM | POA: Insufficient documentation

## 2022-10-20 DIAGNOSIS — D500 Iron deficiency anemia secondary to blood loss (chronic): Secondary | ICD-10-CM

## 2022-10-20 MED ORDER — CYANOCOBALAMIN 1000 MCG/ML IJ SOLN
1000.0000 ug | Freq: Once | INTRAMUSCULAR | Status: AC
  Administered 2022-10-20: 1000 ug via INTRAMUSCULAR
  Filled 2022-10-20: qty 1

## 2022-10-31 ENCOUNTER — Encounter: Payer: Self-pay | Admitting: Internal Medicine

## 2022-10-31 ENCOUNTER — Ambulatory Visit (INDEPENDENT_AMBULATORY_CARE_PROVIDER_SITE_OTHER): Payer: Medicare Other | Admitting: Internal Medicine

## 2022-10-31 VITALS — BP 118/70 | HR 88 | Temp 97.9°F | Resp 16 | Ht 62.0 in | Wt 119.8 lb

## 2022-10-31 DIAGNOSIS — G4733 Obstructive sleep apnea (adult) (pediatric): Secondary | ICD-10-CM

## 2022-10-31 DIAGNOSIS — M810 Age-related osteoporosis without current pathological fracture: Secondary | ICD-10-CM

## 2022-10-31 DIAGNOSIS — D500 Iron deficiency anemia secondary to blood loss (chronic): Secondary | ICD-10-CM

## 2022-10-31 DIAGNOSIS — Z0000 Encounter for general adult medical examination without abnormal findings: Secondary | ICD-10-CM

## 2022-10-31 DIAGNOSIS — M79672 Pain in left foot: Secondary | ICD-10-CM | POA: Diagnosis not present

## 2022-10-31 DIAGNOSIS — K219 Gastro-esophageal reflux disease without esophagitis: Secondary | ICD-10-CM | POA: Diagnosis not present

## 2022-10-31 DIAGNOSIS — Z13220 Encounter for screening for lipoid disorders: Secondary | ICD-10-CM

## 2022-10-31 NOTE — Assessment & Plan Note (Signed)
Physical today 10/31/22.  Due bilateral diagnostic 03/2023.  Colonoscopy 01/10/22 - normal.

## 2022-10-31 NOTE — Progress Notes (Signed)
Subjective:    Patient ID: Jocelyn Case, female    DOB: 1946/03/31, 77 y.o.   MRN: [id number]  Patient here for  Chief Complaint  Patient presents with   Annual Exam    HPI With past history of back pain and reflux as well as osteoporosis.  Comes in today to follow up on these issues as well as for a complete physical exam.  Has seen Dr Sharlet Salina. S/p trigger point injection 05/02/22 and ESI 05/25/22 - right low back/buttock pain. Had f/u 06/21/22 - doing better. Recommended continuing tramadol. Just reevaluated 10/12/22-recommended tramadol tid and tylenol.  She has a bunion.  Request referral to podiatry.  Seeing Dr Tasia Catchings for [place].  On protonix.  Continues cpap. Had bone density - osteoporosis.  Discussed treatment.  Was agreeable to reclast.  Appt given in [place] for 06/2023.  Discussed seeing if could arrange for infusion in town.    Past Medical History:  Diagnosis Date   Anemia    Arthritis    Blood transfusion without reported diagnosis    Complication of anesthesia    CPAP (continuous positive airway pressure) dependence    GERD (gastroesophageal reflux disease)    Incontinence    Migraine    PONV (postoperative nausea and vomiting)    Restless leg syndrome 2015   R leg    Sleep apnea    UTI (urinary tract infection)    Past Surgical History:  Procedure Laterality Date   ABDOMINAL HYSTERECTOMY     APPENDECTOMY     AUGMENTATION MAMMAPLASTY Bilateral 6073   silicone removed 7106/ right side ruptured   BREAST EXCISIONAL BIOPSY Right    neg   CHOLECYSTECTOMY     COLONOSCOPY  2 years ago   COLONOSCOPY WITH PROPOFOL N/A 01/10/2022   Procedure: COLONOSCOPY WITH PROPOFOL;  Surgeon: Lucilla Lame, MD;  Location: [facility];  Service: Endoscopy;  Laterality: N/A;   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   not digesting good, had to have work on duodenal per pt   ESOPHAGOGASTRODUODENOSCOPY (EGD) WITH PROPOFOL N/A 02/14/2018   Procedure: ESOPHAGOGASTRODUODENOSCOPY (EGD) WITH PROPOFOL;   Surgeon: Virgel Manifold, MD;  Location: [facility];  Service: Endoscopy;  Laterality: N/A;   ESOPHAGOGASTRODUODENOSCOPY (EGD) WITH PROPOFOL N/A 02/15/2018   Procedure: ESOPHAGOGASTRODUODENOSCOPY (EGD) WITH PROPOFOL;  Surgeon: Virgel Manifold, MD;  Location: [facility];  Service: Endoscopy;  Laterality: N/A;   ESOPHAGOGASTRODUODENOSCOPY (EGD) WITH PROPOFOL N/A 07/05/2018   Procedure: ESOPHAGOGASTRODUODENOSCOPY (EGD) WITH PROPOFOL;  Surgeon: Virgel Manifold, MD;  Location: [facility];  Service: Endoscopy;  Laterality: N/A;   STOMACH SURGERY  2013   UPPER GASTROINTESTINAL ENDOSCOPY  05/2017   pt stated "bleeding ulcer and stopped up duodenum was noted"   Family History  Problem Relation Age of Onset   Peptic Ulcer Disease Mother    Dementia Mother    COPD Father    Colon cancer Father    Social History   Socioeconomic History   Marital status: Widowed    Spouse name: Not on file   Number of children: Not on file   Years of education: Not on file   Highest education level: Not on file  Occupational History   Not on file  Tobacco Use   Smoking status: Former    Types: Cigarettes    Quit date: 10/03/1974    Years since quitting: 48.1   Smokeless tobacco: Never  Vaping Use   Vaping Use: Never used  Substance and Sexual Activity   Alcohol use:  Yes    Comment: occasional   Drug use: No   Sexual activity: Never  Other Topics Concern   Not on file  Social History Narrative   Not on file   Social Determinants of Health   Financial Resource Strain: Low Risk  (06/06/2022)   Overall Financial Resource Strain (CARDIA)    Difficulty of Paying Living Expenses: Not hard at all  Food Insecurity: No Food Insecurity (06/06/2022)   Hunger Vital Sign    Worried About Running Out of Food in the Last Year: Never true    Ran Out of Food in the Last Year: Never true  Transportation Needs: No Transportation Needs (06/06/2022)   PRAPARE - Radiographer, therapeutic (Medical): No    Lack of Transportation (Non-Medical): No  Physical Activity: Insufficiently Active (06/06/2022)   Exercise Vital Sign    Days of Exercise per Week: 3 days    Minutes of Exercise per Session: 30 min  Stress: No Stress Concern Present (06/06/2022)   [place]    Feeling of Stress : Not at all  Social Connections: Unknown (06/06/2022)   Social Connection and Isolation Panel [NHANES]    Frequency of Communication with Friends and Family: More than three times a week    Frequency of Social Gatherings with Friends and Family: More than three times a week    Attends Religious Services: Not on Advertising copywriter or Organizations: Not on file    Attends Archivist Meetings: Not on file    Marital Status: Not on file     Review of Systems  Constitutional:  Negative for appetite change and unexpected weight change.  HENT:  Negative for congestion, sinus pressure and sore throat.   Eyes:  Negative for pain and visual disturbance.  Respiratory:  Negative for cough, chest tightness and shortness of breath.   Cardiovascular:  Negative for chest pain, palpitations and leg swelling.  Gastrointestinal:  Negative for abdominal pain, diarrhea, nausea and vomiting.  Genitourinary:  Negative for difficulty urinating and dysuria.  Musculoskeletal:  Negative for joint swelling and myalgias.  Skin:  Negative for color change and rash.  Neurological:  Negative for dizziness and headaches.  Hematological:  Negative for adenopathy. Does not bruise/bleed easily.  Psychiatric/Behavioral:  Negative for agitation and dysphoric mood.        Objective:     BP 118/70   Pulse 88   Temp 97.9 F (36.6 C)   Resp 16   Ht '5\' 2"'$  (1.575 m)   Wt 119 lb 12.8 oz (54.3 kg)   SpO2 96%   BMI 21.91 kg/m  Wt Readings from Last 3 Encounters:  10/31/22 119 lb 12.8 oz (54.3 kg)  07/31/22 123 lb 6.4 oz (56  kg)  06/20/22 123 lb 12.8 oz (56.2 kg)    Physical Exam Vitals reviewed.  Constitutional:      General: She is not in acute distress.    Appearance: Normal appearance. She is well-developed.  HENT:     Head: Normocephalic and atraumatic.     Right Ear: External ear normal.     Left Ear: External ear normal.  Eyes:     General: No scleral icterus.       Right eye: No discharge.        Left eye: No discharge.     Conjunctiva/sclera: Conjunctivae normal.  Neck:     Thyroid: No  thyromegaly.  Cardiovascular:     Rate and Rhythm: Normal rate and regular rhythm.  Pulmonary:     Effort: No tachypnea, accessory muscle usage or respiratory distress.     Breath sounds: Normal breath sounds. No decreased breath sounds or wheezing.  Chest:  Breasts:    Right: No inverted nipple, mass, nipple discharge or tenderness (no axillary adenopathy).     Left: No inverted nipple, mass, nipple discharge or tenderness (no axilarry adenopathy).  Abdominal:     General: Bowel sounds are normal.     Palpations: Abdomen is soft.     Tenderness: There is no abdominal tenderness.  Musculoskeletal:        General: No swelling or tenderness.     Cervical back: Neck supple.  Lymphadenopathy:     Cervical: No cervical adenopathy.  Skin:    Findings: No erythema or rash.  Neurological:     Mental Status: She is alert and oriented to person, place, and time.  Psychiatric:        Mood and Affect: Mood normal.        Behavior: Behavior normal.      Outpatient Encounter Medications as of 10/31/2022  Medication Sig   acetaminophen (TYLENOL) 500 MG tablet Take 1 tablet by mouth in the morning and at bedtime.   Cholecalciferol (VITAMIN D3) 50 MCG (2000 UT) TABS Take 2,000 Units by mouth daily.   cyanocobalamin (,VITAMIN B-12,) 1000 MCG/ML injection Inject 1,000 mcg into the muscle every 30 (thirty) days.   Multiple Vitamin (MULTI-VITAMIN) tablet Take 1 tablet by mouth in the morning and at bedtime.    pantoprazole (PROTONIX) 40 MG tablet TAKE 1 TABLET DAILY   traMADol (ULTRAM) 50 MG tablet Take 50 mg by mouth 3 (three) times daily as needed.   No facility-administered encounter medications on file as of 10/31/2022.     Lab Results  Component Value Date   WBC 4.4 06/20/2022   HGB 11.6 (L) 06/20/2022   HCT 37.6 06/20/2022   PLT 212 06/20/2022   GLUCOSE 87 06/20/2022   ALT 27 06/20/2022   AST 29 06/20/2022   NA 141 06/20/2022   K 4.3 06/20/2022   CL 112 (H) 06/20/2022   CREATININE 0.83 06/20/2022   BUN 43 (H) 06/20/2022   CO2 23 06/20/2022   TSH 0.502 12/30/2020    MM DIAG BREAST TOMO UNI RIGHT  Result Date: 09/18/2022 CLINICAL DATA:  Short-term follow-up for likely benign right breast asymmetry thought to represent free silicone from a prior implant rupture. EXAM: DIGITAL DIAGNOSTIC UNILATERAL RIGHT MAMMOGRAM WITH TOMOSYNTHESIS TECHNIQUE: Right digital diagnostic mammography and breast tomosynthesis was performed. COMPARISON:  Previous exam(s). ACR Breast Density Category c: The breast tissue is heterogeneously dense, which may obscure small masses. FINDINGS: The asymmetry in the medial aspect of the right breast appears mammographically stable. No new suspicious calcifications, masses or areas of distortion are seen in the right breast. IMPRESSION: Stable likely benign right breast asymmetry. RECOMMENDATION: Bilateral diagnostic mammogram in June of 2024. I have discussed the findings and recommendations with the patient. If applicable, a reminder letter will be sent to the patient regarding the next appointment. BI-RADS CATEGORY  3: Probably benign. Electronically Signed   By: Ammie Ferrier M.D.   On: 09/18/2022 09:58      Assessment & Plan:  Healthcare maintenance Assessment & Plan: Physical today 10/31/22.  Due bilateral diagnostic 03/2023.  Colonoscopy 01/10/22 - normal.    Left foot pain -     Ambulatory  referral to Podiatry  Gastroesophageal reflux disease, unspecified  whether esophagitis present Assessment & Plan: Extensive GI issues/procedures, etc - as outlined previously. Has seen Dr Allen Norris.  On protonix.  Appears to be stable currently.  Follow.    Iron deficiency anemia due to chronic blood loss Assessment & Plan: Receives iron infusions - hematology.  Has had GI evaluation as outlined.  Continue follow up with hematology.    OSA on CPAP Assessment & Plan: Continue cpap.  Follow.    Osteoporosis, unspecified osteoporosis type, unspecified pathological fracture presence Assessment & Plan: Recent bone density with osteoporosis.  Discussed treatment options.  Hold on oral bisphosphonates given GI history.  Interested in [place].  Calcium, vitamin D and weight bearing exercise.  Was referred for IV reclast. Appt given in [place] 06/2023.  See if can arrange for local reclast infusion. Need to check vitamin d level.   Orders: -     Hepatic function panel; Future -     Basic metabolic panel; Future -     Urinalysis, Routine w reflex microscopic; Future -     VITAMIN D 25 Hydroxy (Vit-D Deficiency, Fractures); Future  Screening cholesterol level -     Lipid panel; Future     Einar Pheasant, MD

## 2022-11-05 ENCOUNTER — Telehealth: Payer: Self-pay | Admitting: Internal Medicine

## 2022-11-05 ENCOUNTER — Encounter: Payer: Self-pay | Admitting: Internal Medicine

## 2022-11-05 NOTE — Assessment & Plan Note (Addendum)
Recent bone density with osteoporosis.  Discussed treatment options.  Hold on oral bisphosphonates given GI history.  Interested in [place].  Calcium, vitamin D and weight bearing exercise.  Was referred for IV reclast. Appt given in [place] 06/2023.  See if can arrange for local reclast infusion. Need to check vitamin d level.

## 2022-11-05 NOTE — Assessment & Plan Note (Signed)
Continue cpap.  Follow.

## 2022-11-05 NOTE — Assessment & Plan Note (Signed)
Extensive GI issues/procedures, etc - as outlined previously. Has seen Dr Allen Norris.  On protonix.  Appears to be stable currently.  Follow.

## 2022-11-05 NOTE — Telephone Encounter (Signed)
Jocelyn Case is the patient that needs reclast.  She will need the ordered blood work prior to receiving reclast.  I was not sure when she would be able to get the infusion.  She can go ahead and come in for fasting labs and then will have the information to order the reclast. Let me know what I need to do.

## 2022-11-05 NOTE — Assessment & Plan Note (Signed)
Receives iron infusions - hematology.  Has had GI evaluation as outlined.  Continue follow up with hematology.

## 2022-11-10 NOTE — Telephone Encounter (Signed)
Ok.  Let me know if I need to do anything.   

## 2022-11-10 NOTE — Telephone Encounter (Signed)
Received denial from [place] / [employer], Reclast is not on patient formulary trying to get approved through Medicare part B. I  am going to ask Catie for help on this.

## 2022-11-15 NOTE — Telephone Encounter (Signed)
Scheduled for 11/23/22 for labs,

## 2022-11-15 NOTE — Telephone Encounter (Signed)
Will need labs prior to ordering/giving reclast.  The labs are in the system.  Currently scheduled in March for fasting lab.  Can schedule earlier appt if pt desires.  Once have labs, then can order reclast.

## 2022-11-15 NOTE — Telephone Encounter (Signed)
[  9:03 AM] Tresa Res no prior auth required. Reclast is covered at 100% for this patient.    So now Dr. Nicki Reaper I just need the order placed and I can call short stay to schedule.

## 2022-11-17 ENCOUNTER — Ambulatory Visit (INDEPENDENT_AMBULATORY_CARE_PROVIDER_SITE_OTHER): Payer: Medicare Other

## 2022-11-17 ENCOUNTER — Encounter: Payer: Self-pay | Admitting: Podiatry

## 2022-11-17 ENCOUNTER — Ambulatory Visit (INDEPENDENT_AMBULATORY_CARE_PROVIDER_SITE_OTHER): Payer: Medicare Other | Admitting: Podiatry

## 2022-11-17 DIAGNOSIS — M778 Other enthesopathies, not elsewhere classified: Secondary | ICD-10-CM | POA: Diagnosis not present

## 2022-11-17 DIAGNOSIS — M2012 Hallux valgus (acquired), left foot: Secondary | ICD-10-CM | POA: Diagnosis not present

## 2022-11-17 NOTE — Progress Notes (Signed)
Subjective:  Patient ID: Jocelyn Case, female    DOB: [birth year],  MRN: [id number]  Chief Complaint  Patient presents with   Foot Pain    "I have pain on the left foot, the bunion.  My right foot hurts on the right foot on the bottom on that bone (1st met).  My toenails may have a fungus."    77 y.o. female presents with the above complaint.  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  Patient states is painful to touch painful to walk on is progressive gotten worse worse with ambulation hurts with pressure she has not seen anyone else prior to seeing me.  She has tried some shoe gear modification which does not help.  She wanted to discuss options to help with get corrected.  She denies any other acute complaints   Review of Systems: Negative except as noted in the HPI. Denies N/V/F/Ch.  Past Medical History:  Diagnosis Date   Anemia    Arthritis    Blood transfusion without reported diagnosis    Complication of anesthesia    CPAP (continuous positive airway pressure) dependence    GERD (gastroesophageal reflux disease)    Incontinence    Migraine    PONV (postoperative nausea and vomiting)    Restless leg syndrome 2015   R leg    Sleep apnea    UTI (urinary tract infection)     Current Outpatient Medications:    acetaminophen (TYLENOL) 500 MG tablet, Take 1 tablet by mouth in the morning and at bedtime., Disp: , Rfl:    calcium carbonate (TUMS - DOSED IN MG ELEMENTAL CALCIUM) 500 MG chewable tablet, Chew 1 tablet by mouth daily., Disp: , Rfl:    Cholecalciferol (VITAMIN D3) 50 MCG (2000 UT) TABS, Take 2,000 Units by mouth daily., Disp: , Rfl:    cyanocobalamin (,VITAMIN B-12,) 1000 MCG/ML injection, Inject 1,000 mcg into the muscle every 30 (thirty) days., Disp: , Rfl:    Multiple Vitamin (MULTI-VITAMIN) tablet, Take 1 tablet by mouth in the morning and at bedtime., Disp: , Rfl:    pantoprazole (PROTONIX) 40 MG  tablet, TAKE 1 TABLET DAILY, Disp: 90 tablet, Rfl: 0   traMADol (ULTRAM) 50 MG tablet, Take 50 mg by mouth 3 (three) times daily as needed., Disp: , Rfl:   Social History   Tobacco Use  Smoking Status Former   Types: Cigarettes   Quit date: 10/03/1974   Years since quitting: 48.1  Smokeless Tobacco Never    Allergies  Allergen Reactions   Solifenacin Rash   Morphine Nausea And Vomiting   Objective:  There were no vitals filed for this visit. There is no height or weight on file to calculate BMI. Constitutional Well developed. Well nourished.  Vascular Dorsalis pedis pulses palpable bilaterally. Posterior tibial pulses palpable bilaterally. Capillary refill normal to all digits.  No cyanosis or clubbing noted. Pedal hair growth normal.  Neurologic Normal speech. Oriented to person, place, and time. Epicritic sensation to light touch grossly present bilaterally.  Dermatologic Nails well groomed and normal in appearance. No open wounds. No skin lesions.  Orthopedic: Pain on palpation to the left first metatarsophalangeal joint limited range of motion noted to the joint with crepitus clinically appreciated deep intra-articular first MPJ pain noted underlying severe bunion deformity noted.  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  Joint contracture noted second metatarsophalangeal joint   Radiographs: None Assessment:   1.  Capsulitis of foot, right   2. Hallux abducto valgus, left    Plan:  Patient was evaluated and treated and all questions answered.  Left severe bunion deformity with underlying arthritis with second digit crossover hammertoe deformity -All questions and concerns were discussed with the patient in extensive detail -Given the amount of pain that she is experiencing she will benefit from MPJ fusion with second digit arthroplasty with possible capsulotomy of second metatarsophalangeal joint I discussed my surgical plans with the  patient in extensive detail she states understanding will think about the procedure -I discussed shoe gear modification and other conservative care.  She would like to hold off on injection as well  No follow-ups on file.   Left severe bunion deformity with underlying arthritis and second toe hammertoe contracture.  Discussed fusion with hammertoe arthroplasty she will think about it and get back to me

## 2022-11-20 ENCOUNTER — Inpatient Hospital Stay: Payer: Medicare Other | Attending: Oncology

## 2022-11-20 DIAGNOSIS — E538 Deficiency of other specified B group vitamins: Secondary | ICD-10-CM | POA: Insufficient documentation

## 2022-11-20 DIAGNOSIS — D500 Iron deficiency anemia secondary to blood loss (chronic): Secondary | ICD-10-CM

## 2022-11-20 MED ORDER — CYANOCOBALAMIN 1000 MCG/ML IJ SOLN
1000.0000 ug | Freq: Once | INTRAMUSCULAR | Status: AC
  Administered 2022-11-20: 1000 ug via INTRAMUSCULAR
  Filled 2022-11-20: qty 1

## 2022-11-23 ENCOUNTER — Other Ambulatory Visit (INDEPENDENT_AMBULATORY_CARE_PROVIDER_SITE_OTHER): Payer: Medicare Other

## 2022-11-23 DIAGNOSIS — M810 Age-related osteoporosis without current pathological fracture: Secondary | ICD-10-CM | POA: Diagnosis not present

## 2022-11-23 DIAGNOSIS — Z13220 Encounter for screening for lipoid disorders: Secondary | ICD-10-CM

## 2022-11-23 LAB — URINALYSIS, ROUTINE W REFLEX MICROSCOPIC
Bilirubin Urine: NEGATIVE
Hgb urine dipstick: NEGATIVE
Ketones, ur: NEGATIVE
Leukocytes,Ua: NEGATIVE
Nitrite: NEGATIVE
RBC / HPF: NONE SEEN (ref 0–?)
Specific Gravity, Urine: 1.015 (ref 1.000–1.030)
Total Protein, Urine: NEGATIVE
Urine Glucose: NEGATIVE
Urobilinogen, UA: 0.2 (ref 0.0–1.0)
pH: 6 (ref 5.0–8.0)

## 2022-11-23 LAB — BASIC METABOLIC PANEL
BUN: 30 mg/dL — ABNORMAL HIGH (ref 6–23)
CO2: 29 mEq/L (ref 19–32)
Calcium: 9.4 mg/dL (ref 8.4–10.5)
Chloride: 105 mEq/L (ref 96–112)
Creatinine, Ser: 0.85 mg/dL (ref 0.40–1.20)
GFR: 65.33 mL/min (ref >60.00)
Glucose, Bld: 94 mg/dL (ref 70–99)
Potassium: 4.6 mEq/L (ref 3.5–5.1)
Sodium: 142 mEq/L (ref 135–145)

## 2022-11-23 LAB — LIPID PANEL
Cholesterol: 162 mg/dL (ref 0–200)
HDL: 63.3 mg/dL (ref >39.00)
LDL Cholesterol: 83 mg/dL (ref 0–99)
NonHDL: 98.41
Total CHOL/HDL Ratio: 3
Triglycerides: 77 mg/dL (ref 0.0–149.0)
VLDL: 15.4 mg/dL (ref 0.0–40.0)

## 2022-11-23 LAB — VITAMIN D 25 HYDROXY (VIT D DEFICIENCY, FRACTURES): VITD: 55.96 ng/mL (ref 30.00–100.00)

## 2022-11-23 LAB — HEPATIC FUNCTION PANEL
ALT: 27 U/L (ref 0–35)
AST: 25 U/L (ref 0–37)
Albumin: 4.1 g/dL (ref 3.5–5.2)
Alkaline Phosphatase: 69 U/L (ref 39–117)
Bilirubin, Direct: 0.1 mg/dL (ref 0.0–0.3)
Total Bilirubin: 0.3 mg/dL (ref 0.2–1.2)
Total Protein: 6.2 g/dL (ref 6.0–8.3)

## 2022-11-24 NOTE — Telephone Encounter (Signed)
See result note.  

## 2022-11-26 ENCOUNTER — Other Ambulatory Visit: Payer: Self-pay | Admitting: Internal Medicine

## 2022-11-26 NOTE — Progress Notes (Signed)
Opened in error

## 2022-11-27 ENCOUNTER — Encounter: Payer: Self-pay | Admitting: Internal Medicine

## 2022-11-27 NOTE — Addendum Note (Signed)
Addended by: Nanci Pina on: 11/27/2022 09:34 AM   Modules accepted: Orders

## 2022-12-05 ENCOUNTER — Ambulatory Visit (INDEPENDENT_AMBULATORY_CARE_PROVIDER_SITE_OTHER): Payer: Medicare Other | Admitting: Internal Medicine

## 2022-12-05 ENCOUNTER — Encounter: Payer: Self-pay | Admitting: Internal Medicine

## 2022-12-05 VITALS — BP 118/76 | HR 75 | Temp 97.8°F | Resp 16 | Ht 62.0 in | Wt 117.0 lb

## 2022-12-05 DIAGNOSIS — K219 Gastro-esophageal reflux disease without esophagitis: Secondary | ICD-10-CM | POA: Diagnosis not present

## 2022-12-05 DIAGNOSIS — M79672 Pain in left foot: Secondary | ICD-10-CM

## 2022-12-05 DIAGNOSIS — D500 Iron deficiency anemia secondary to blood loss (chronic): Secondary | ICD-10-CM | POA: Diagnosis not present

## 2022-12-05 NOTE — Progress Notes (Signed)
Subjective:    Patient ID: Jocelyn Case, female    DOB: 20-Feb-1946, 77 y.o.   MRN: [id number]  Patient here for  Chief Complaint  Patient presents with   Referral    Left foot     HPI Work in appt to discuss her left foot pain. Has been having pain left foot - bunion with second digit hammertoe contracture. Increased pain with walking. Saw Dr Posey Pronto.  Recommended surgery - fusion with hammertoe arthroplasty.  Discussed with her today.  She request second opinion.  Persistent pain.  Padding helps.  States PT - aggravated her back.  No chest pain or sob reported.  No cough or congestion reported.     Past Medical History:  Diagnosis Date   Anemia    Arthritis    Blood transfusion without reported diagnosis    Complication of anesthesia    CPAP (continuous positive airway pressure) dependence    GERD (gastroesophageal reflux disease)    Incontinence    Migraine    PONV (postoperative nausea and vomiting)    Restless leg syndrome 2015   R leg    Sleep apnea    UTI (urinary tract infection)    Past Surgical History:  Procedure Laterality Date   ABDOMINAL HYSTERECTOMY     APPENDECTOMY     AUGMENTATION MAMMAPLASTY Bilateral 0000000   silicone removed [id number] right side ruptured   BREAST EXCISIONAL BIOPSY Right    neg   CHOLECYSTECTOMY     COLONOSCOPY  2 years ago   COLONOSCOPY WITH PROPOFOL N/A 01/10/2022   Procedure: COLONOSCOPY WITH PROPOFOL;  Surgeon: Lucilla Lame, MD;  Location: [facility];  Service: Endoscopy;  Laterality: N/A;   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   not digesting good, had to have work on duodenal per pt   ESOPHAGOGASTRODUODENOSCOPY (EGD) WITH PROPOFOL N/A 02/14/2018   Procedure: ESOPHAGOGASTRODUODENOSCOPY (EGD) WITH PROPOFOL;  Surgeon: Virgel Manifold, MD;  Location: [facility];  Service: Endoscopy;  Laterality: N/A;   ESOPHAGOGASTRODUODENOSCOPY (EGD) WITH PROPOFOL N/A 02/15/2018   Procedure: ESOPHAGOGASTRODUODENOSCOPY (EGD) WITH PROPOFOL;   Surgeon: Virgel Manifold, MD;  Location: [facility];  Service: Endoscopy;  Laterality: N/A;   ESOPHAGOGASTRODUODENOSCOPY (EGD) WITH PROPOFOL N/A 07/05/2018   Procedure: ESOPHAGOGASTRODUODENOSCOPY (EGD) WITH PROPOFOL;  Surgeon: Virgel Manifold, MD;  Location: [facility];  Service: Endoscopy;  Laterality: N/A;   STOMACH SURGERY  2013   UPPER GASTROINTESTINAL ENDOSCOPY  05/2017   pt stated "bleeding ulcer and stopped up duodenum was noted"   Family History  Problem Relation Age of Onset   Peptic Ulcer Disease Mother    Dementia Mother    COPD Father    Colon cancer Father    Social History   Socioeconomic History   Marital status: Widowed    Spouse name: Not on file   Number of children: Not on file   Years of education: Not on file   Highest education level: Not on file  Occupational History   Not on file  Tobacco Use   Smoking status: Former    Types: Cigarettes    Quit date: 10/03/1974    Years since quitting: 48.2   Smokeless tobacco: Never  Vaping Use   Vaping Use: Never used  Substance and Sexual Activity   Alcohol use: Yes    Alcohol/week: 1.0 standard drink of alcohol    Types: 1 Glasses of wine per week    Comment: every night   Drug use: No   Sexual activity: Never  Other Topics Concern   Not on file  Social History Narrative   Not on file   Social Determinants of Health   Financial Resource Strain: Low Risk  (06/06/2022)   Overall Financial Resource Strain (CARDIA)    Difficulty of Paying Living Expenses: Not hard at all  Food Insecurity: No Food Insecurity (06/06/2022)   Hunger Vital Sign    Worried About Running Out of Food in the Last Year: Never true    Ran Out of Food in the Last Year: Never true  Transportation Needs: No Transportation Needs (06/06/2022)   PRAPARE - Hydrologist (Medical): No    Lack of Transportation (Non-Medical): No  Physical Activity: Insufficiently Active (06/06/2022)   Exercise Vital  Sign    Days of Exercise per Week: 3 days    Minutes of Exercise per Session: 30 min  Stress: No Stress Concern Present (06/06/2022)   [place]    Feeling of Stress : Not at all  Social Connections: Unknown (06/06/2022)   Social Connection and Isolation Panel [NHANES]    Frequency of Communication with Friends and Family: More than three times a week    Frequency of Social Gatherings with Friends and Family: More than three times a week    Attends Religious Services: Not on Advertising copywriter or Organizations: Not on file    Attends Archivist Meetings: Not on file    Marital Status: Not on file     Review of Systems  Constitutional:  Negative for appetite change and unexpected weight change.  HENT:  Negative for congestion and sinus pressure.   Respiratory:  Negative for cough, chest tightness and shortness of breath.   Cardiovascular:  Negative for chest pain and palpitations.  Gastrointestinal:  Negative for abdominal pain, diarrhea, nausea and vomiting.  Genitourinary:  Negative for difficulty urinating and dysuria.  Musculoskeletal:  Negative for myalgias.       Foot pain as outlined.   Skin:  Negative for color change and rash.  Neurological:  Negative for dizziness and headaches.  Psychiatric/Behavioral:  Negative for agitation and dysphoric mood.        Objective:     BP 118/76   Pulse 75   Temp 97.8 F (36.6 C)   Resp 16   Ht '5\' 2"'$  (1.575 m)   Wt 117 lb (53.1 kg)   SpO2 97%   BMI 21.40 kg/m  Wt Readings from Last 3 Encounters:  12/05/22 117 lb (53.1 kg)  10/31/22 119 lb 12.8 oz (54.3 kg)  07/31/22 123 lb 6.4 oz (56 kg)    Physical Exam Vitals reviewed.  Constitutional:      General: She is not in acute distress.    Appearance: Normal appearance.  HENT:     Head: Normocephalic and atraumatic.     Right Ear: External ear normal.     Left Ear: External ear normal.   Eyes:     General: No scleral icterus.       Right eye: No discharge.        Left eye: No discharge.     Conjunctiva/sclera: Conjunctivae normal.  Neck:     Thyroid: No thyromegaly.  Cardiovascular:     Rate and Rhythm: Normal rate and regular rhythm.  Pulmonary:     Effort: No respiratory distress.     Breath sounds: Normal breath sounds. No wheezing.  Abdominal:  General: Bowel sounds are normal.     Palpations: Abdomen is soft.     Tenderness: There is no abdominal tenderness.  Musculoskeletal:        General: No swelling or tenderness.     Cervical back: Neck supple. No tenderness.  Lymphadenopathy:     Cervical: No cervical adenopathy.  Skin:    Findings: No erythema or rash.  Neurological:     Mental Status: She is alert.  Psychiatric:        Mood and Affect: Mood normal.        Behavior: Behavior normal.      Outpatient Encounter Medications as of 12/05/2022  Medication Sig   acetaminophen (TYLENOL) 500 MG tablet Take 1 tablet by mouth in the morning and at bedtime.   calcium carbonate (TUMS - DOSED IN MG ELEMENTAL CALCIUM) 500 MG chewable tablet Chew 1 tablet by mouth daily.   Cholecalciferol (VITAMIN D3) 50 MCG (2000 UT) TABS Take 2,000 Units by mouth daily.   cyanocobalamin (,VITAMIN B-12,) 1000 MCG/ML injection Inject 1,000 mcg into the muscle every 30 (thirty) days.   Multiple Vitamin (MULTI-VITAMIN) tablet Take 1 tablet by mouth in the morning and at bedtime.   pantoprazole (PROTONIX) 40 MG tablet TAKE 1 TABLET DAILY   traMADol (ULTRAM) 50 MG tablet Take 50 mg by mouth 3 (three) times daily as needed.   No facility-administered encounter medications on file as of 12/05/2022.     Lab Results  Component Value Date   WBC 4.4 06/20/2022   HGB 11.6 (L) 06/20/2022   HCT 37.6 06/20/2022   PLT 212 06/20/2022   GLUCOSE 94 11/23/2022   CHOL 162 11/23/2022   TRIG 77.0 11/23/2022   HDL 63.30 11/23/2022   LDLCALC 83 11/23/2022   ALT 27 11/23/2022   AST 25  11/23/2022   NA 142 11/23/2022   K 4.6 11/23/2022   CL 105 11/23/2022   CREATININE 0.85 11/23/2022   BUN 30 (H) 11/23/2022   CO2 29 11/23/2022   TSH 0.502 12/30/2020    MM DIAG BREAST TOMO UNI RIGHT  Result Date: 09/18/2022 CLINICAL DATA:  Short-term follow-up for likely benign right breast asymmetry thought to represent free silicone from a prior implant rupture. EXAM: DIGITAL DIAGNOSTIC UNILATERAL RIGHT MAMMOGRAM WITH TOMOSYNTHESIS TECHNIQUE: Right digital diagnostic mammography and breast tomosynthesis was performed. COMPARISON:  Previous exam(s). ACR Breast Density Category c: The breast tissue is heterogeneously dense, which may obscure small masses. FINDINGS: The asymmetry in the medial aspect of the right breast appears mammographically stable. No new suspicious calcifications, masses or areas of distortion are seen in the right breast. IMPRESSION: Stable likely benign right breast asymmetry. RECOMMENDATION: Bilateral diagnostic mammogram in June of 2024. I have discussed the findings and recommendations with the patient. If applicable, a reminder letter will be sent to the patient regarding the next appointment. BI-RADS CATEGORY  3: Probably benign. Electronically Signed   By: Ammie Ferrier M.D.   On: 09/18/2022 09:58      Assessment & Plan:  Left foot pain Assessment & Plan: .Has been having pain left foot - bunion with second digit hammertoe contracture. Increased pain with walking. Saw Dr Posey Pronto.  Recommended surgery - fusion with hammertoe arthroplasty.  Discussed with her today.  Limits activity.  Request to see foot specialist in [place].   Orders: -     Ambulatory referral to Orthopedic Surgery  Iron deficiency anemia due to chronic blood loss Assessment & Plan: Receives iron infusions - hematology.  Has had GI evaluation as outlined.  Continue follow up with hematology.    Gastroesophageal reflux disease, unspecified whether esophagitis present Assessment &  Plan: Extensive GI issues/procedures, etc - as outlined previously. Has seen Dr Allen Norris.  On protonix.  Appears to be stable currently.  Follow.       Einar Pheasant, MD

## 2022-12-08 ENCOUNTER — Encounter: Payer: Self-pay | Admitting: Internal Medicine

## 2022-12-08 ENCOUNTER — Ambulatory Visit (INDEPENDENT_AMBULATORY_CARE_PROVIDER_SITE_OTHER): Payer: Medicare Other | Admitting: Podiatry

## 2022-12-08 DIAGNOSIS — M19071 Primary osteoarthritis, right ankle and foot: Secondary | ICD-10-CM | POA: Diagnosis not present

## 2022-12-08 DIAGNOSIS — M7751 Other enthesopathy of right foot: Secondary | ICD-10-CM | POA: Diagnosis not present

## 2022-12-08 MED ORDER — METHYLPREDNISOLONE 4 MG PO TBPK: ORAL_TABLET | ORAL | 0 refills | Status: DC

## 2022-12-08 NOTE — Progress Notes (Signed)
Subjective:  Patient ID: Jocelyn Case, female    DOB: 1946/08/30,  MRN: [id number]  Chief Complaint  Patient presents with   Foot Pain    Pt stated that she is still having trouble with her right foot     77 y.o. female presents with the above complaint.  Patient presents with a new complaint of right ankle capsulitis.  Patient states is painful to touch is progressive gotten worse came out of nowhere hurts with ambulation worse with pressure patient has a history of subtalar joint fusion done in the past.  She states that this just came on over pain scale 7 out of 10 hurts with ambulation worse with pressure nothing is helped she is tried Voltaren gel taking tramadol none of which has helped   Review of Systems: Negative except as noted in the HPI. Denies N/V/F/Ch.  Past Medical History:  Diagnosis Date   Anemia    Arthritis    Blood transfusion without reported diagnosis    Complication of anesthesia    CPAP (continuous positive airway pressure) dependence    GERD (gastroesophageal reflux disease)    Incontinence    Migraine    PONV (postoperative nausea and vomiting)    Restless leg syndrome 2015   R leg    Sleep apnea    UTI (urinary tract infection)     Current Outpatient Medications:    methylPREDNISolone (MEDROL DOSEPAK) 4 MG TBPK tablet, Take as directed, Disp: 21 each, Rfl: 0   acetaminophen (TYLENOL) 500 MG tablet, Take 1 tablet by mouth in the morning and at bedtime., Disp: , Rfl:    calcium carbonate (TUMS - DOSED IN MG ELEMENTAL CALCIUM) 500 MG chewable tablet, Chew 1 tablet by mouth daily., Disp: , Rfl:    Cholecalciferol (VITAMIN D3) 50 MCG (2000 UT) TABS, Take 2,000 Units by mouth daily., Disp: , Rfl:    cyanocobalamin (,VITAMIN B-12,) 1000 MCG/ML injection, Inject 1,000 mcg into the muscle every 30 (thirty) days., Disp: , Rfl:    Multiple Vitamin (MULTI-VITAMIN) tablet, Take 1 tablet by mouth in the morning and at bedtime., Disp: , Rfl:    pantoprazole (PROTONIX)  40 MG tablet, TAKE 1 TABLET DAILY, Disp: 90 tablet, Rfl: 0   traMADol (ULTRAM) 50 MG tablet, Take 50 mg by mouth 3 (three) times daily as needed., Disp: , Rfl:   Social History   Tobacco Use  Smoking Status Former   Types: Cigarettes   Quit date: 10/03/1974   Years since quitting: 48.2  Smokeless Tobacco Never    Allergies  Allergen Reactions   Solifenacin Rash   Morphine Nausea And Vomiting   Objective:  There were no vitals filed for this visit. There is no height or weight on file to calculate BMI. Constitutional Well developed. Well nourished.  Vascular Dorsalis pedis pulses palpable bilaterally. Posterior tibial pulses palpable bilaterally. Capillary refill normal to all digits.  No cyanosis or clubbing noted. Pedal hair growth normal.  Neurologic Normal speech. Oriented to person, place, and time. Epicritic sensation to light touch grossly present bilaterally.  Dermatologic Nails well groomed and normal in appearance. No open wounds. No skin lesions.  Orthopedic: Pain on palpation to the right ankle pain with range of motion of the ankle joint deep intra-articular ankle joint pain noted pain on the lateral and medial gutter of the ankle.  No range of motion noted at the subtalar joint.  No pain at the subtalar joint.  Some crepitus noted in the ankle joint  Radiographs: None Assessment:   1. Capsulitis of right ankle   2. Arthritis of ankle, right    Plan:  Patient was evaluated and treated and all questions answered.  Right ankle capsulitis with underlying osteoarthritis -All questions and concerns were discussed with the patient in extensive detail -Given that she has some arthritic changes that is present it might of just flared up patient will benefit from steroid injection help decrease inflammatory component associate with pain.  Patient agrees with plan like to proceed with steroid injection -A steroid injection was performed at right ankle joint using 1%  plain Lidocaine and 10 mg of Kenalog. This was well tolerated. -I discussed shoe gear modification.  Patient states understanding -Medrol Dosepak was sent to the pharmacy as well.   No follow-ups on file.

## 2022-12-10 ENCOUNTER — Encounter: Payer: Self-pay | Admitting: Internal Medicine

## 2022-12-10 NOTE — Assessment & Plan Note (Signed)
Extensive GI issues/procedures, etc - as outlined previously. Has seen Dr Wohl.  On protonix.  Appears to be stable currently.  Follow.  

## 2022-12-10 NOTE — Assessment & Plan Note (Signed)
.  Has been having pain left foot - bunion with second digit hammertoe contracture. Increased pain with walking. Saw Dr Posey Pronto.  Recommended surgery - fusion with hammertoe arthroplasty.  Discussed with her today.  Limits activity.  Request to see foot specialist in [place].

## 2022-12-10 NOTE — Assessment & Plan Note (Signed)
Receives iron infusions - hematology.  Has had GI evaluation as outlined.  Continue follow up with hematology.  

## 2022-12-19 ENCOUNTER — Inpatient Hospital Stay: Payer: Medicare Other | Attending: Oncology

## 2022-12-19 DIAGNOSIS — D500 Iron deficiency anemia secondary to blood loss (chronic): Secondary | ICD-10-CM | POA: Insufficient documentation

## 2022-12-19 DIAGNOSIS — E538 Deficiency of other specified B group vitamins: Secondary | ICD-10-CM | POA: Insufficient documentation

## 2022-12-19 DIAGNOSIS — D7589 Other specified diseases of blood and blood-forming organs: Secondary | ICD-10-CM

## 2022-12-19 LAB — VITAMIN B12: Vitamin B-12: 470 pg/mL (ref 180–914)

## 2022-12-19 LAB — CBC WITH DIFFERENTIAL/PLATELET
Abs Immature Granulocytes: 0.02 10*3/uL (ref 0.00–0.07)
Basophils Absolute: 0 10*3/uL (ref 0.0–0.1)
Basophils Relative: 0 %
Eosinophils Absolute: 0.3 10*3/uL (ref 0.0–0.5)
Eosinophils Relative: 4 %
HCT: 36.8 % (ref 36.0–46.0)
Hemoglobin: 11.6 g/dL — ABNORMAL LOW (ref 12.0–15.0)
Immature Granulocytes: 0 %
Lymphocytes Relative: 21 %
Lymphs Abs: 1.2 10*3/uL (ref 0.7–4.0)
MCH: 32.6 pg (ref 26.0–34.0)
MCHC: 31.5 g/dL (ref 30.0–36.0)
MCV: 103.4 fL — ABNORMAL HIGH (ref 80.0–100.0)
Monocytes Absolute: 0.3 10*3/uL (ref 0.1–1.0)
Monocytes Relative: 5 %
Neutro Abs: 4.1 10*3/uL (ref 1.7–7.7)
Neutrophils Relative %: 70 %
Platelets: 217 10*3/uL (ref 150–400)
RBC: 3.56 MIL/uL — ABNORMAL LOW (ref 3.87–5.11)
RDW: 13 % (ref 11.5–15.5)
WBC: 5.9 10*3/uL (ref 4.0–10.5)
nRBC: 0 % (ref 0.0–0.2)

## 2022-12-19 LAB — COMPREHENSIVE METABOLIC PANEL
ALT: 32 U/L (ref 0–44)
AST: 33 U/L (ref 15–41)
Albumin: 3.7 g/dL (ref 3.5–5.0)
Alkaline Phosphatase: 62 U/L (ref 38–126)
Anion gap: 7 (ref 5–15)
BUN: 44 mg/dL — ABNORMAL HIGH (ref 8–23)
CO2: 24 mmol/L (ref 22–32)
Calcium: 8.7 mg/dL — ABNORMAL LOW (ref 8.9–10.3)
Chloride: 107 mmol/L (ref 98–111)
Creatinine, Ser: 0.88 mg/dL (ref 0.44–1.00)
GFR, Estimated: >60 mL/min (ref >60)
Glucose, Bld: 132 mg/dL — ABNORMAL HIGH (ref 70–99)
Potassium: 4.2 mmol/L (ref 3.5–5.1)
Sodium: 138 mmol/L (ref 135–145)
Total Bilirubin: 0.4 mg/dL (ref 0.3–1.2)
Total Protein: 6.5 g/dL (ref 6.5–8.1)

## 2022-12-19 LAB — IRON AND TIBC
Iron: 44 ug/dL (ref 28–170)
Saturation Ratios: 12 % (ref 10.4–31.8)
TIBC: 356 ug/dL (ref 250–450)
UIBC: 312 ug/dL

## 2022-12-19 LAB — FERRITIN: Ferritin: 120 ng/mL (ref 11–307)

## 2022-12-19 LAB — FOLATE: Folate: 38 ng/mL (ref >5.9)

## 2022-12-20 ENCOUNTER — Ambulatory Visit: Payer: Medicare Other | Admitting: Nurse Practitioner

## 2022-12-20 MED FILL — Iron Sucrose Inj 20 MG/ML (Fe Equiv): INTRAVENOUS | Qty: 10 | Status: AC

## 2022-12-21 ENCOUNTER — Inpatient Hospital Stay: Payer: Medicare Other

## 2022-12-21 ENCOUNTER — Inpatient Hospital Stay (HOSPITAL_BASED_OUTPATIENT_CLINIC_OR_DEPARTMENT_OTHER): Payer: Medicare Other | Admitting: Oncology

## 2022-12-21 ENCOUNTER — Encounter: Payer: Self-pay | Admitting: Oncology

## 2022-12-21 VITALS — BP 110/61 | HR 77 | Temp 98.5°F | Resp 16 | Ht 62.0 in | Wt 119.3 lb

## 2022-12-21 DIAGNOSIS — D7589 Other specified diseases of blood and blood-forming organs: Secondary | ICD-10-CM

## 2022-12-21 DIAGNOSIS — D500 Iron deficiency anemia secondary to blood loss (chronic): Secondary | ICD-10-CM

## 2022-12-21 DIAGNOSIS — E538 Deficiency of other specified B group vitamins: Secondary | ICD-10-CM | POA: Diagnosis not present

## 2022-12-21 MED ORDER — CYANOCOBALAMIN 1000 MCG/ML IJ SOLN
1000.0000 ug | Freq: Once | INTRAMUSCULAR | Status: AC
  Administered 2022-12-21: 1000 ug via INTRAMUSCULAR
  Filled 2022-12-21: qty 1

## 2022-12-21 MED ORDER — SODIUM CHLORIDE 0.9 % IV SOLN
200.0000 mg | Freq: Once | INTRAVENOUS | Status: AC
  Administered 2022-12-21: 200 mg via INTRAVENOUS
  Filled 2022-12-21: qty 200

## 2022-12-21 MED ORDER — SODIUM CHLORIDE 0.9 % IV SOLN
Freq: Once | INTRAVENOUS | Status: AC
  Filled 2022-12-21: qty 250

## 2022-12-21 NOTE — Progress Notes (Signed)
Hematology/Oncology Progress note [phone] [phone]     Chief Complaint: Jocelyn Case is a 77 y.o. female presents for follow up of B12 deficiency and iron deficiency, s/p partial gastrectomy in 2009  ASSESSMENT & PLAN:   Iron deficiency anemia due to chronic blood loss Labs reviewed and discussed with patient. Hemoglobin and iron levels are stable. I recommend patient to proceed with 1 dose of Venofer for maintenance given her history of gastrectomy.  B12 deficiency B12 level is stable at 470. Continue monthly vitamin B12 injection  Macrocytosis MCV is stable.  Continue to monitor counts.   Orders Placed This Encounter  Procedures   CBC with Differential ([facility] Only)    Standing Status:   Future    Standing Expiration Date:   12/21/2023   Iron and TIBC    Standing Status:   Future    Standing Expiration Date:   12/21/2023   Ferritin    Standing Status:   Future    Standing Expiration Date:   12/21/2023   Vitamin B12    Standing Status:   Future    Standing Expiration Date:   12/21/2023   Follow-up plan B12 monthly x 5 Lab in 6 months, iron labs, B12, folate, cmp prior to MD + B12 inj/ Venofer.   All questions were answered. The patient knows to call the clinic with any problems, questions or concerns.  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 On EGD she also has an additional small bowel anastomosis. 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  Her symptoms were worsening since removal of the stent and had daily emesis and decreased oral intake. 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  INTERVAL HISTORY Jocelyn Case is a 77 y.o. female who has above history reviewed by me today presents for follow up visit for management of iron deficiency anemia, B12 deficiency. Patient reports fatigue is slightly better  Otherwise no new complaints.  Patient has been on monthly vitamin B12 injection.  Tolerates well. Denies hematochezia, hematuria, hematemesis, epistaxis, black tarry stool or easy bruising.    Past Medical History:  Diagnosis Date   Anemia    Arthritis    Blood transfusion without reported diagnosis    Complication of anesthesia    CPAP (continuous positive airway pressure) dependence    GERD (gastroesophageal reflux disease)    Incontinence    Migraine    PONV (postoperative nausea and vomiting)    Restless leg syndrome 2015   R leg    Sleep apnea    UTI (urinary tract infection)     Past Surgical History:  Procedure Laterality Date   ABDOMINAL HYSTERECTOMY     APPENDECTOMY     AUGMENTATION MAMMAPLASTY Bilateral 0000000   silicone removed [id number] right side ruptured   BREAST EXCISIONAL BIOPSY Right    neg   CHOLECYSTECTOMY     COLONOSCOPY  2 years ago   COLONOSCOPY WITH PROPOFOL N/A 01/10/2022   Procedure: COLONOSCOPY WITH PROPOFOL;  Surgeon: Lucilla Lame, MD;  Location: [facility];  Service: Endoscopy;  Laterality: N/A;  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   not digesting good, had to have work on duodenal per pt   ESOPHAGOGASTRODUODENOSCOPY (EGD) WITH PROPOFOL N/A 02/14/2018   Procedure: ESOPHAGOGASTRODUODENOSCOPY (EGD) WITH PROPOFOL;  Surgeon: Virgel Manifold, MD;  Location: [facility];  Service: Endoscopy;  Laterality: N/A;   ESOPHAGOGASTRODUODENOSCOPY (EGD) WITH PROPOFOL N/A 02/15/2018   Procedure: ESOPHAGOGASTRODUODENOSCOPY (EGD) WITH PROPOFOL;   Surgeon: Virgel Manifold, MD;  Location: [facility];  Service: Endoscopy;  Laterality: N/A;   ESOPHAGOGASTRODUODENOSCOPY (EGD) WITH PROPOFOL N/A 07/05/2018   Procedure: ESOPHAGOGASTRODUODENOSCOPY (EGD) WITH PROPOFOL;  Surgeon: Virgel Manifold, MD;  Location: [facility];  Service: Endoscopy;  Laterality: N/A;   STOMACH SURGERY  2013   UPPER GASTROINTESTINAL ENDOSCOPY  05/2017   pt stated "bleeding ulcer and stopped up duodenum was noted"    Family History  Problem Relation Age of Onset   Peptic Ulcer Disease Mother    Dementia Mother    COPD Father    Colon cancer Father     Social History:  reports that she quit smoking about 48 years ago. Her smoking use included cigarettes. She has never used smokeless tobacco. She reports current alcohol use of about 1.0 standard drink of alcohol per week. She reports that she does not use drugs.   Allergies:  Allergies  Allergen Reactions   Solifenacin Rash   Morphine Nausea And Vomiting    Current Medications: Current Outpatient Medications  Medication Sig Dispense Refill   acetaminophen (TYLENOL) 500 MG tablet Take 1 tablet by mouth in the morning and at bedtime.     calcium carbonate (TUMS - DOSED IN MG ELEMENTAL CALCIUM) 500 MG chewable tablet Chew 1 tablet by mouth daily.     Cholecalciferol (VITAMIN D3) 50 MCG (2000 UT) TABS Take 2,000 Units by mouth daily.     cyanocobalamin (,VITAMIN B-12,) 1000 MCG/ML injection Inject 1,000 mcg into the muscle every 30 (thirty) days.     methylPREDNISolone (MEDROL DOSEPAK) 4 MG TBPK tablet Take as directed 21 each 0   Multiple Vitamin (MULTI-VITAMIN) tablet Take 1 tablet by mouth in the morning and at bedtime.     pantoprazole (PROTONIX) 40 MG tablet TAKE 1 TABLET DAILY 90 tablet 0   traMADol (ULTRAM) 50 MG tablet Take 50 mg by mouth 3 (three) times daily as needed.     No current facility-administered medications for this visit.     Performance status (ECOG):  0-1  Vitals Blood pressure 110/61, pulse 77, temperature 98.5 F (36.9 C), temperature source Tympanic, resp. rate 16, height 5\' 2"  (1.575 m), weight 119 lb 4.8 oz (54.1 kg), SpO2 100 %.   Physical Exam Constitutional:      General: She is not in acute distress.    Appearance: She is not diaphoretic.  HENT:     Head: Normocephalic and atraumatic.     Nose: Nose normal.     Mouth/Throat:     Pharynx: No oropharyngeal exudate.  Eyes:     General: No scleral icterus.    Pupils: Pupils are equal, round, and reactive to light.  Cardiovascular:     Rate and Rhythm: Normal rate and regular rhythm.     Heart sounds: No murmur heard. Pulmonary:     Effort: Pulmonary effort is normal. No respiratory distress.     Breath sounds: No rales.  Chest:     Chest wall: No tenderness.  Abdominal:     General: There is no distension.     Palpations: Abdomen is soft.  Tenderness: There is no abdominal tenderness.  Musculoskeletal:        General: Normal range of motion.     Cervical back: Normal range of motion and neck supple.  Skin:    General: Skin is warm and dry.     Findings: No erythema.  Neurological:     Mental Status: She is alert and oriented to person, place, and time.     Cranial Nerves: No cranial nerve deficit.     Motor: No abnormal muscle tone.     Coordination: Coordination normal.  Psychiatric:        Mood and Affect: Mood and affect normal.     Labs    Latest Ref Rng & Units 12/19/2022    1:58 PM 06/20/2022    1:22 PM 03/16/2022    2:36 PM  CBC  WBC 4.0 - 10.5 K/uL 5.9  4.4  4.5   Hemoglobin 12.0 - 15.0 g/dL 11.6  11.6  10.8   Hematocrit 36.0 - 46.0 % 36.8  37.6  35.3   Platelets 150 - 400 K/uL 217  212  243       Latest Ref Rng & Units 12/19/2022    1:58 PM 11/23/2022    9:24 AM 06/20/2022    1:22 PM  CMP  Glucose 70 - 99 mg/dL 132  94  87   BUN 8 - 23 mg/dL 44  30  43   Creatinine 0.44 - 1.00 mg/dL 0.88  0.85  0.83   Sodium 135 - 145 mmol/L 138  142   141   Potassium 3.5 - 5.1 mmol/L 4.2  4.6  4.3   Chloride 98 - 111 mmol/L 107  105  112   CO2 22 - 32 mmol/L 24  29  23    Calcium 8.9 - 10.3 mg/dL 8.7  9.4  8.6   Total Protein 6.5 - 8.1 g/dL 6.5  6.2  6.7   Total Bilirubin 0.3 - 1.2 mg/dL 0.4  0.3  0.5   Alkaline Phos 38 - 126 U/L 62  69  61   AST 15 - 41 U/L 33  25  29   ALT 0 - 44 U/L 32  27  27

## 2022-12-21 NOTE — Assessment & Plan Note (Signed)
Labs reviewed and discussed with patient. Hemoglobin and iron levels are stable. I recommend patient to proceed with 1 dose of Venofer for maintenance given her history of gastrectomy.

## 2022-12-21 NOTE — Assessment & Plan Note (Signed)
MCV is stable.  Continue to monitor counts.

## 2022-12-21 NOTE — Assessment & Plan Note (Signed)
B12 level is stable at 470. Continue monthly vitamin B12 injection

## 2023-01-10 ENCOUNTER — Telehealth: Payer: Self-pay | Admitting: Gastroenterology

## 2023-01-10 MED ORDER — PANTOPRAZOLE SODIUM 40 MG PO TBEC: 40.0000 mg | DELAYED_RELEASE_TABLET | Freq: Every day | ORAL | 1 refills | Status: DC

## 2023-01-10 NOTE — Telephone Encounter (Signed)
Patient calling requesting a refill on her prescription of pantoprazole 40mg .

## 2023-01-10 NOTE — Telephone Encounter (Signed)
Rx sent through e-scribe  

## 2023-01-10 NOTE — Addendum Note (Signed)
Addended by: Roena Malady on: 01/10/2023 09:14 AM   Modules accepted: Orders

## 2023-01-10 NOTE — Telephone Encounter (Signed)
[facility] in [place] for Pantoprazole 40mg  refill**

## 2023-01-22 ENCOUNTER — Ambulatory Visit: Payer: Medicare Other

## 2023-01-22 ENCOUNTER — Inpatient Hospital Stay: Payer: Medicare Other | Attending: Oncology

## 2023-01-22 DIAGNOSIS — E538 Deficiency of other specified B group vitamins: Secondary | ICD-10-CM | POA: Insufficient documentation

## 2023-01-22 DIAGNOSIS — D500 Iron deficiency anemia secondary to blood loss (chronic): Secondary | ICD-10-CM

## 2023-01-22 MED ORDER — CYANOCOBALAMIN 1000 MCG/ML IJ SOLN
1000.0000 ug | Freq: Once | INTRAMUSCULAR | Status: AC
  Administered 2023-01-22: 1000 ug via INTRAMUSCULAR
  Filled 2023-01-22: qty 1

## 2023-01-29 ENCOUNTER — Ambulatory Visit: Payer: Medicare Other

## 2023-02-05 ENCOUNTER — Ambulatory Visit: Payer: Medicare Other

## 2023-02-12 ENCOUNTER — Ambulatory Visit: Payer: Medicare Other

## 2023-02-19 ENCOUNTER — Ambulatory Visit: Payer: Medicare Other

## 2023-02-21 ENCOUNTER — Inpatient Hospital Stay: Payer: Medicare Other | Attending: Oncology

## 2023-02-21 DIAGNOSIS — D500 Iron deficiency anemia secondary to blood loss (chronic): Secondary | ICD-10-CM

## 2023-02-21 DIAGNOSIS — E538 Deficiency of other specified B group vitamins: Secondary | ICD-10-CM | POA: Diagnosis present

## 2023-02-21 MED ORDER — CYANOCOBALAMIN 1000 MCG/ML IJ SOLN
1000.0000 ug | Freq: Once | INTRAMUSCULAR | Status: AC
  Administered 2023-02-21: 1000 ug via INTRAMUSCULAR
  Filled 2023-02-21: qty 1

## 2023-03-01 ENCOUNTER — Ambulatory Visit (INDEPENDENT_AMBULATORY_CARE_PROVIDER_SITE_OTHER): Payer: Medicare Other | Admitting: Internal Medicine

## 2023-03-01 ENCOUNTER — Encounter: Payer: Self-pay | Admitting: Internal Medicine

## 2023-03-01 VITALS — BP 122/70 | HR 60 | Temp 97.9°F | Resp 16 | Ht 62.0 in | Wt 118.0 lb

## 2023-03-01 DIAGNOSIS — Z13220 Encounter for screening for lipoid disorders: Secondary | ICD-10-CM | POA: Diagnosis not present

## 2023-03-01 DIAGNOSIS — D500 Iron deficiency anemia secondary to blood loss (chronic): Secondary | ICD-10-CM

## 2023-03-01 DIAGNOSIS — K219 Gastro-esophageal reflux disease without esophagitis: Secondary | ICD-10-CM

## 2023-03-01 DIAGNOSIS — G2581 Restless legs syndrome: Secondary | ICD-10-CM

## 2023-03-01 DIAGNOSIS — M79672 Pain in left foot: Secondary | ICD-10-CM

## 2023-03-01 DIAGNOSIS — G4733 Obstructive sleep apnea (adult) (pediatric): Secondary | ICD-10-CM | POA: Diagnosis not present

## 2023-03-01 DIAGNOSIS — M810 Age-related osteoporosis without current pathological fracture: Secondary | ICD-10-CM | POA: Diagnosis not present

## 2023-03-01 NOTE — Progress Notes (Signed)
Subjective:    Patient ID: Jocelyn Case, female    DOB: 02-Mar-1946, 77 y.o.   MRN: [id number]  Patient here for  Chief Complaint  Patient presents with   Medical Management of Chronic Issues    HPI Here to follow up regarding IDA and GERD.  Was having pain - right ankle.  Saw Dr Allena Katz.  S/p steroid injection.  No relief.  Evaluated -  [facility] - back pain.  Recommended aquatic PT.  Also found to have fibular fracture.  Conservative therapy.  Ankle is better.  Going to therapy for her back. Back is doing better.  Trying to stay active.  No chest pain or sob reported.  No abdominal pain or bowel change reported.  Seeing Dr Cathie Hoops - last 12/21/22 - received venofer. Discussed reclast.  Agreeable.     Past Medical History:  Diagnosis Date   Anemia    Arthritis    Blood transfusion without reported diagnosis    Complication of anesthesia    CPAP (continuous positive airway pressure) dependence    GERD (gastroesophageal reflux disease)    Incontinence    Migraine    PONV (postoperative nausea and vomiting)    Restless leg syndrome 2015   R leg    Sleep apnea    UTI (urinary tract infection)    Past Surgical History:  Procedure Laterality Date   ABDOMINAL HYSTERECTOMY     APPENDECTOMY     AUGMENTATION MAMMAPLASTY Bilateral 1974   silicone removed 2014/ right side ruptured   BREAST EXCISIONAL BIOPSY Right    neg   CHOLECYSTECTOMY     COLONOSCOPY  2 years ago   COLONOSCOPY WITH PROPOFOL N/A 01/10/2022   Procedure: COLONOSCOPY WITH PROPOFOL;  Surgeon: Midge Minium, MD;  Location: [facility];  Service: Endoscopy;  Laterality: N/A;   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   not digesting good, had to have work on duodenal per pt   ESOPHAGOGASTRODUODENOSCOPY (EGD) WITH PROPOFOL N/A 02/14/2018   Procedure: ESOPHAGOGASTRODUODENOSCOPY (EGD) WITH PROPOFOL;  Surgeon: Pasty Spillers, MD;  Location: [facility];  Service: Endoscopy;  Laterality: N/A;   ESOPHAGOGASTRODUODENOSCOPY (EGD)  WITH PROPOFOL N/A 02/15/2018   Procedure: ESOPHAGOGASTRODUODENOSCOPY (EGD) WITH PROPOFOL;  Surgeon: Pasty Spillers, MD;  Location: [facility];  Service: Endoscopy;  Laterality: N/A;   ESOPHAGOGASTRODUODENOSCOPY (EGD) WITH PROPOFOL N/A 07/05/2018   Procedure: ESOPHAGOGASTRODUODENOSCOPY (EGD) WITH PROPOFOL;  Surgeon: Pasty Spillers, MD;  Location: [facility];  Service: Endoscopy;  Laterality: N/A;   STOMACH SURGERY  2013   UPPER GASTROINTESTINAL ENDOSCOPY  05/2017   pt stated "bleeding ulcer and stopped up duodenum was noted"   Family History  Problem Relation Age of Onset   Peptic Ulcer Disease Mother    Dementia Mother    COPD Father    Colon cancer Father    Social History   Socioeconomic History   Marital status: Widowed    Spouse name: Not on file   Number of children: Not on file   Years of education: Not on file   Highest education level: Not on file  Occupational History   Not on file  Tobacco Use   Smoking status: Former    Types: Cigarettes    Quit date: 10/03/1974    Years since quitting: 48.4   Smokeless tobacco: Never  Vaping Use   Vaping Use: Never used  Substance and Sexual Activity   Alcohol use: Yes    Alcohol/week: 1.0 standard drink of alcohol    Types: 1  Glasses of wine per week    Comment: every night   Drug use: No   Sexual activity: Never  Other Topics Concern   Not on file  Social History Narrative   Not on file   Social Determinants of Health   Financial Resource Strain: Low Risk  (06/06/2022)   Overall Financial Resource Strain (CARDIA)    Difficulty of Paying Living Expenses: Not hard at all  Food Insecurity: No Food Insecurity (06/06/2022)   Hunger Vital Sign    Worried About Running Out of Food in the Last Year: Never true    Ran Out of Food in the Last Year: Never true  Transportation Needs: No Transportation Needs (06/06/2022)   PRAPARE - Administrator, Civil Service (Medical): No    Lack of Transportation  (Non-Medical): No  Physical Activity: Insufficiently Active (06/06/2022)   Exercise Vital Sign    Days of Exercise per Week: 3 days    Minutes of Exercise per Session: 30 min  Stress: No Stress Concern Present (06/06/2022)   [employer] of Occupational Health - Occupational Stress Questionnaire    Feeling of Stress : Not at all  Social Connections: Unknown (06/06/2022)   Social Connection and Isolation Panel [NHANES]    Frequency of Communication with Friends and Family: More than three times a week    Frequency of Social Gatherings with Friends and Family: More than three times a week    Attends Religious Services: Not on Marketing executive or Organizations: Not on file    Attends Banker Meetings: Not on file    Marital Status: Not on file     Review of Systems  Constitutional:  Negative for appetite change and unexpected weight change.  HENT:  Negative for congestion and sinus pressure.   Respiratory:  Negative for cough, chest tightness and shortness of breath.   Cardiovascular:  Negative for chest pain, palpitations and leg swelling.  Gastrointestinal:  Negative for abdominal pain, diarrhea, nausea and vomiting.  Genitourinary:  Negative for difficulty urinating and dysuria.  Musculoskeletal:  Negative for myalgias.       Ankle pain is better.   Skin:  Negative for color change and rash.  Neurological:  Negative for dizziness and headaches.  Psychiatric/Behavioral:  Negative for agitation and dysphoric mood.        Objective:     BP 122/70   Pulse 60   Temp 97.9 F (36.6 C)   Resp 16   Ht 5\' 2"  (1.575 m)   Wt 118 lb (53.5 kg)   SpO2 97%   BMI 21.58 kg/m  Wt Readings from Last 3 Encounters:  03/01/23 118 lb (53.5 kg)  12/21/22 119 lb 4.8 oz (54.1 kg)  12/05/22 117 lb (53.1 kg)    Physical Exam Vitals reviewed.  Constitutional:      General: She is not in acute distress.    Appearance: Normal appearance.  HENT:     Head:  Normocephalic and atraumatic.     Right Ear: External ear normal.     Left Ear: External ear normal.  Eyes:     General: No scleral icterus.       Right eye: No discharge.        Left eye: No discharge.     Conjunctiva/sclera: Conjunctivae normal.  Neck:     Thyroid: No thyromegaly.  Cardiovascular:     Rate and Rhythm: Normal rate and regular rhythm.  Pulmonary:  Effort: No respiratory distress.     Breath sounds: Normal breath sounds. No wheezing.  Abdominal:     General: Bowel sounds are normal.     Palpations: Abdomen is soft.     Tenderness: There is no abdominal tenderness.  Musculoskeletal:        General: No swelling or tenderness.     Cervical back: Neck supple. No tenderness.  Lymphadenopathy:     Cervical: No cervical adenopathy.  Skin:    Findings: No erythema or rash.  Neurological:     Mental Status: She is alert.  Psychiatric:        Mood and Affect: Mood normal.        Behavior: Behavior normal.      Outpatient Encounter Medications as of 03/01/2023  Medication Sig   acetaminophen (TYLENOL) 500 MG tablet Take 1 tablet by mouth in the morning and at bedtime.   calcium carbonate (TUMS - DOSED IN MG ELEMENTAL CALCIUM) 500 MG chewable tablet Chew 1 tablet by mouth daily.   Cholecalciferol (VITAMIN D3) 50 MCG (2000 UT) TABS Take 2,000 Units by mouth daily.   cyanocobalamin (,VITAMIN B-12,) 1000 MCG/ML injection Inject 1,000 mcg into the muscle every 30 (thirty) days.   Multiple Vitamin (MULTI-VITAMIN) tablet Take 1 tablet by mouth in the morning and at bedtime.   pantoprazole (PROTONIX) 40 MG tablet Take 1 tablet (40 mg total) by mouth daily.   traMADol (ULTRAM) 50 MG tablet Take 50 mg by mouth 3 (three) times daily as needed.   [DISCONTINUED] methylPREDNISolone (MEDROL DOSEPAK) 4 MG TBPK tablet Take as directed   No facility-administered encounter medications on file as of 03/01/2023.     Lab Results  Component Value Date   WBC 5.9 12/19/2022   HGB  11.6 (L) 12/19/2022   HCT 36.8 12/19/2022   PLT 217 12/19/2022   GLUCOSE 132 (H) 12/19/2022   CHOL 162 11/23/2022   TRIG 77.0 11/23/2022   HDL 63.30 11/23/2022   LDLCALC 83 11/23/2022   ALT 32 12/19/2022   AST 33 12/19/2022   NA 138 12/19/2022   K 4.2 12/19/2022   CL 107 12/19/2022   CREATININE 0.88 12/19/2022   BUN 44 (H) 12/19/2022   CO2 24 12/19/2022   TSH 0.502 12/30/2020    MM DIAG BREAST TOMO UNI RIGHT  Result Date: 09/18/2022 CLINICAL DATA:  Short-term follow-up for likely benign right breast asymmetry thought to represent free silicone from a prior implant rupture. EXAM: DIGITAL DIAGNOSTIC UNILATERAL RIGHT MAMMOGRAM WITH TOMOSYNTHESIS TECHNIQUE: Right digital diagnostic mammography and breast tomosynthesis was performed. COMPARISON:  Previous exam(s). ACR Breast Density Category c: The breast tissue is heterogeneously dense, which may obscure small masses. FINDINGS: The asymmetry in the medial aspect of the right breast appears mammographically stable. No new suspicious calcifications, masses or areas of distortion are seen in the right breast. IMPRESSION: Stable likely benign right breast asymmetry. RECOMMENDATION: Bilateral diagnostic mammogram in June of 2024. I have discussed the findings and recommendations with the patient. If applicable, a reminder letter will be sent to the patient regarding the next appointment. BI-RADS CATEGORY  3: Probably benign. Electronically Signed   By: Frederico Hamman M.D.   On: 09/18/2022 09:58      Assessment & Plan:  Screening cholesterol level -     Lipid panel; Future  Osteoporosis, unspecified osteoporosis type, unspecified pathological fracture presence Assessment & Plan: Recent bone density with osteoporosis.  Have discussed treatment options.  Hold on oral bisphosphonates given GI history.  Interested in reclast.  Calcium, vitamin D and weight bearing exercise. Refer for IV reclast.   Orders: -     Basic metabolic panel;  Future -     Ambulatory referral to Endocrinology  OSA on CPAP Assessment & Plan: Continue cpap.  Follow.   Orders: -     Hepatic function panel; Future  Gastroesophageal reflux disease, unspecified whether esophagitis present Assessment & Plan: Extensive GI issues/procedures, etc - as outlined previously. Has seen Dr Servando Snare.  On protonix.  Appears to be stable currently.  Follow.    Iron deficiency anemia due to chronic blood loss Assessment & Plan: Receives iron infusions - hematology.  Has had GI evaluation as outlined.  Continue follow up with hematology.    Left foot pain Assessment & Plan: Recent foot and ankle pain.  Found to have fibular fracture.  Has been seeing ortho. Doing better.  Follow.    RLS (restless legs syndrome) Assessment & Plan: Treating IDA.  Follow.       Dale [place], MD

## 2023-03-04 ENCOUNTER — Encounter: Payer: Self-pay | Admitting: Internal Medicine

## 2023-03-04 NOTE — Assessment & Plan Note (Signed)
Continue cpap.  Follow.   

## 2023-03-04 NOTE — Assessment & Plan Note (Signed)
Receives iron infusions - hematology.  Has had GI evaluation as outlined.  Continue follow up with hematology.  

## 2023-03-04 NOTE — Assessment & Plan Note (Signed)
Recent bone density with osteoporosis.  Have discussed treatment options.  Hold on oral bisphosphonates given GI history.  Interested in reclast.  Calcium, vitamin D and weight bearing exercise. Refer for IV reclast.

## 2023-03-04 NOTE — Assessment & Plan Note (Signed)
Extensive GI issues/procedures, etc - as outlined previously. Has seen Dr Wohl.  On protonix.  Appears to be stable currently.  Follow.  

## 2023-03-04 NOTE — Assessment & Plan Note (Signed)
Recent foot and ankle pain.  Found to have fibular fracture.  Has been seeing ortho. Doing better.  Follow.

## 2023-03-04 NOTE — Assessment & Plan Note (Signed)
Treating IDA.  Follow.  

## 2023-03-06 ENCOUNTER — Emergency Department: Payer: Medicare Other

## 2023-03-06 ENCOUNTER — Emergency Department
Admission: EM | Admit: 2023-03-06 | Discharge: 2023-03-06 | Disposition: A | Discharge status: Home / Self Care | Payer: Medicare Other | Attending: Student in an Organized Health Care Education/Training Program | Admitting: Student in an Organized Health Care Education/Training Program

## 2023-03-06 ENCOUNTER — Encounter: Payer: Self-pay | Admitting: Internal Medicine

## 2023-03-06 DIAGNOSIS — S0083XA Contusion of other part of head, initial encounter: Secondary | ICD-10-CM

## 2023-03-06 DIAGNOSIS — S0990XA Unspecified injury of head, initial encounter: Secondary | ICD-10-CM | POA: Diagnosis present

## 2023-03-06 DIAGNOSIS — W01198A Fall on same level from slipping, tripping and stumbling with subsequent striking against other object, initial encounter: Secondary | ICD-10-CM | POA: Diagnosis not present

## 2023-03-06 DIAGNOSIS — W19XXXA Unspecified fall, initial encounter: Secondary | ICD-10-CM

## 2023-03-06 NOTE — ED Provider Notes (Signed)
[facility] Provider Note    Event Date/Time   First MD Initiated Contact with Patient 03/06/23 1349     (approximate)   History   Fall   HPI  Jocelyn Case is a 77 y.o. female who presents to the ER for evaluation of head injury and right hand pain that occurred after she had mechanical fall on Friday.  No LOC.  States that she tripped on a crack in the sidewalk.  Is not having numbness or tingling.  No other associated injury.  Went to walk-in clinic and was directed to the ER for further evaluation.    Physical Exam   Triage Vital Signs: ED Triage Vitals  Enc Vitals Group     BP 03/06/23 1338 (!) 117/59     Pulse Rate 03/06/23 1338 83     Resp 03/06/23 1338 16     Temp 03/06/23 1338 98 F (36.7 C)     Temp Source 03/06/23 1338 Oral     SpO2 03/06/23 1338 98 %     Weight 03/06/23 1336 114 lb (51.7 kg)     Height --      Head Circumference --      Peak Flow --      Pain Score 03/06/23 1335 1     Pain Loc --      Pain Edu? --      Excl. in GC? --     Most recent vital signs: Vitals:   03/06/23 1338  BP: (!) 117/59  Pulse: 83  Resp: 16  Temp: 98 F (36.7 C)  SpO2: 98%     Constitutional: Alert  Eyes: Conjunctivae are normal.  No proptosis, extraocular motions are intact Head: A contusion to the right cheek.  Nose: No congestion/rhinnorhea. Mouth/Throat: Mucous membranes are moist.   Neck: Painless ROM.  Cardiovascular:   Good peripheral circulation. Respiratory: Normal respiratory effort.  No retractions.  Gastrointestinal: Soft and nontender.  Musculoskeletal:  no deformity Neurologic:  MAE spontaneously. No gross focal neurologic deficits are appreciated.  Skin:  Skin is warm, dry and intact. No rash noted. Psychiatric: Mood and affect are normal. Speech and behavior are normal.    ED Results / Procedures / Treatments   Labs (all labs ordered are listed, but only abnormal results are displayed) Labs Reviewed - No data to  display   EKG     RADIOLOGY Please see ED Course for my review and interpretation.  I personally reviewed all radiographic images ordered to evaluate for the above acute complaints and reviewed radiology reports and findings.  These findings were personally discussed with the patient.  Please see medical record for radiology report.    PROCEDURES:  Critical Care performed:   Procedures   MEDICATIONS ORDERED IN ED: Medications - No data to display   IMPRESSION / MDM / ASSESSMENT AND PLAN / ED COURSE  I reviewed the triage vital signs and the nursing notes.                              Differential diagnosis includes, but is not limited to, sdh,iph, fracture, contusion, dislocation  Patient presented to the ER for evaluation of injuries as described above.  Radiographs as well as CT imaging will be ordered.   Clinical Course as of 03/06/23 1442  Tue Mar 06, 2023  1406 CT head on my review and interpretation without evidence of SDH or IPH. [PR]  Clinical Course User Index [PR] Willy Eddy, MD   Imaging without acute findings.  Patient does appear stable and appropriate for outpatient follow-up.  FINAL CLINICAL IMPRESSION(S) / ED DIAGNOSES   Final diagnoses:  Fall, initial encounter  Contusion of face, initial encounter     Rx / DC Orders   ED Discharge Orders     None        Note:  This document was prepared using Dragon voice recognition software and may include unintentional dictation errors.    Willy Eddy, MD 03/06/23 [id number]

## 2023-03-06 NOTE — ED Notes (Signed)
C collar applied in triage. Pt c/o neck discomfort

## 2023-03-06 NOTE — ED Triage Notes (Signed)
See first nurse note. 

## 2023-03-06 NOTE — ED Triage Notes (Signed)
First Nurse Note;  Pt via POV from [facility]. Pt had a fall on Friday, pt did hit the side of her face on the sidewalk. Denies LOC. Denies blood thinner. Pt has bruising on the R side of her face. Pt took tramadol at noon. Pt is A&Ox4 and NAD. VSS with KC.

## 2023-03-07 ENCOUNTER — Other Ambulatory Visit: Payer: Self-pay | Admitting: Internal Medicine

## 2023-03-07 DIAGNOSIS — N6489 Other specified disorders of breast: Secondary | ICD-10-CM

## 2023-03-09 ENCOUNTER — Telehealth: Payer: Self-pay

## 2023-03-09 NOTE — Transitions of Care (Post Inpatient/ED Visit) (Unsigned)
I spoke with pt; pt said on 03/02/23 pt fell on neighbor's sidewalk due to stepping up on curb and tripped; pt hit head and rt cheek; no LOC. Pt is feeling better since seen in ED on 03/06/23. pt said since doing better pt does not want to schedule appt with PCP at this time and if appt needed pt has office contact info.pt also said if Dr Lorin Picket wants to eval pt let pt know and she will schedule appt. Sending note to Dr Lorin Picket.      03/09/2023  Name: Jocelyn Case MRN: [id number] DOB: 08-May-1946  Today's TOC FU Call Status: Today's TOC FU Call Status:: Successful TOC FU Call Competed TOC FU Call Complete Date: 03/09/23  Transition Care Management Follow-up Telephone Call Date of Discharge: 03/06/23 Discharge Facility: [facility]) Type of Discharge: Emergency Department Reason for ED Visit: Other: (pt fell on 03/02/23 on neighbors sidewalk due to curb and pt tripped.pt did hit her head and rt cheek;no LOC.) How have you been since you were released from the hospital?: Better Any questions or concerns?: No  Items Reviewed: Did you receive and understand the discharge instructions provided?: Yes Medications obtained,verified, and reconciled?: Yes (Medications Reviewed) Any new allergies since your discharge?: No Dietary orders reviewed?: NA Do you have support at home?: Yes People in Home: alone Name of Support/Comfort Primary Source: has neighbors who are willing to help pt.  Medications Reviewed Today: Medications Reviewed Today     Reviewed by Dale [place], MD (Physician) on 03/04/23 at 2227  Med List Status: <None>   Medication Order Taking? Sig Documenting Provider Last Dose Status Informant  acetaminophen (TYLENOL) 500 MG tablet [id number] No Take 1 tablet by mouth in the morning and at bedtime. [provider] Taking Active   calcium carbonate (TUMS - DOSED IN MG ELEMENTAL CALCIUM) 500 MG chewable tablet [id number] No Chew 1 tablet by mouth  daily. [provider] Taking Active   Cholecalciferol (VITAMIN D3) 50 MCG (2000 UT) TABS [id number] No Take 2,000 Units by mouth daily. [provider] Taking Active   cyanocobalamin (,VITAMIN B-12,) 1000 MCG/ML injection [id number] No Inject 1,000 mcg into the muscle every 30 (thirty) days. [provider] Taking Active   Multiple Vitamin (MULTI-VITAMIN) tablet [id number] No Take 1 tablet by mouth in the morning and at bedtime. [provider] Taking Active   pantoprazole (PROTONIX) 40 MG tablet [id number]  Take 1 tablet (40 mg total) by mouth daily. Midge Minium, MD  Active   traMADol Janean Sark) 50 MG tablet [id number] No Take 50 mg by mouth 3 (three) times daily as needed. [provider] Taking Active             Home Care and Equipment/Supplies: Were Home Health Services Ordered?: NA (pt already going to aquatic therapy due to back pain) Any new equipment or medical supplies ordered?: NA  Functional Questionnaire: Do you need assistance with bathing/showering or dressing?: No Do you need assistance with meal preparation?: No Do you need assistance with eating?: No Do you have difficulty maintaining continence: No Do you need assistance with getting out of bed/getting out of a chair/moving?: No Do you have difficulty managing or taking your medications?: No  Follow up appointments reviewed: PCP Follow-up appointment confirmed?: No (pt said since doing better pt does not want to schedule appt with PCP at this time and if appt needed pt has office contact info.pt also said if Dr Lorin Picket wants to eval pt let pt  know and she will schedule appt.) MD Provider Line Number:[phone] Given: Yes Specialist Hospital Follow-up appointment confirmed?: NA Do you need transportation to your follow-up appointment?: No Do you understand care options if your condition(s) worsen?: Yes-patient verbalized understanding    SIGNATURE Lewanda Rife, LPN

## 2023-03-09 NOTE — Telephone Encounter (Signed)
Please confirm no headache, dizziness, etc.  If any change or problems, she needs to be evaluated.

## 2023-03-12 NOTE — Telephone Encounter (Signed)
LMTCB

## 2023-03-20 NOTE — Telephone Encounter (Signed)
Confirmed with patient no acute symptoms. No headache no dizziness. Did not feel f/u necessary at this time. Patient will let us know if she needs anything.

## 2023-03-26 ENCOUNTER — Inpatient Hospital Stay: Payer: Medicare Other | Attending: Oncology

## 2023-03-26 DIAGNOSIS — E538 Deficiency of other specified B group vitamins: Secondary | ICD-10-CM | POA: Diagnosis present

## 2023-03-26 DIAGNOSIS — D500 Iron deficiency anemia secondary to blood loss (chronic): Secondary | ICD-10-CM

## 2023-03-26 MED ORDER — CYANOCOBALAMIN 1000 MCG/ML IJ SOLN
1000.0000 ug | Freq: Once | INTRAMUSCULAR | Status: AC
  Administered 2023-03-26: 1000 ug via INTRAMUSCULAR
  Filled 2023-03-26: qty 1

## 2023-03-27 ENCOUNTER — Ambulatory Visit
Admission: RE | Admit: 2023-03-27 | Discharge: 2023-03-27 | Disposition: A | Discharge status: Home / Self Care | Payer: Medicare Other | Source: Ambulatory Visit | Attending: Internal Medicine | Admitting: Internal Medicine

## 2023-03-27 DIAGNOSIS — N6489 Other specified disorders of breast: Secondary | ICD-10-CM | POA: Diagnosis present

## 2023-04-11 ENCOUNTER — Other Ambulatory Visit: Payer: Self-pay | Admitting: Gastroenterology

## 2023-04-23 ENCOUNTER — Inpatient Hospital Stay: Payer: Medicare Other | Attending: Oncology

## 2023-04-23 DIAGNOSIS — E538 Deficiency of other specified B group vitamins: Secondary | ICD-10-CM | POA: Diagnosis present

## 2023-04-23 DIAGNOSIS — D500 Iron deficiency anemia secondary to blood loss (chronic): Secondary | ICD-10-CM

## 2023-04-23 MED ORDER — CYANOCOBALAMIN 1000 MCG/ML IJ SOLN
1000.0000 ug | Freq: Once | INTRAMUSCULAR | Status: AC
  Administered 2023-04-23: 1000 ug via INTRAMUSCULAR
  Filled 2023-04-23: qty 1

## 2023-05-02 ENCOUNTER — Encounter: Payer: Self-pay | Admitting: Internal Medicine

## 2023-05-02 ENCOUNTER — Encounter: Payer: Self-pay | Admitting: Hematology and Oncology

## 2023-05-15 ENCOUNTER — Encounter: Payer: Self-pay | Admitting: Gastroenterology

## 2023-05-15 ENCOUNTER — Ambulatory Visit: Payer: Medicare Other | Admitting: Gastroenterology

## 2023-05-15 VITALS — BP 125/66 | HR 82 | Temp 97.9°F | Wt 117.0 lb

## 2023-05-15 DIAGNOSIS — K219 Gastro-esophageal reflux disease without esophagitis: Secondary | ICD-10-CM

## 2023-05-15 MED ORDER — PANTOPRAZOLE SODIUM 40 MG PO TBEC: 40.0000 mg | DELAYED_RELEASE_TABLET | Freq: Every day | ORAL | 3 refills | Status: DC

## 2023-05-15 NOTE — Progress Notes (Signed)
Primary Care Physician: Dale Bennett, MD  Primary Gastroenterologist:  Dr. Midge Minium  Chief Complaint  Patient presents with   Gastroesophageal Reflux   Follow-up   Medication Refill    HPI: Jocelyn Case is a 77 y.o. female here for follow-up of GERD.  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 The patient reports that she is doing very well on the medication and only has very intermittent acid breakthrough.  She has given by staff a change of pharmacy prior to sending off any further refills.  She denies any dysphagia black stools bloody stools nausea vomiting or unexplained weight loss.  She does have chronic diarrhea associated with her past reflux surgery.   Past Medical History:  Diagnosis Date   Anemia    Arthritis    Blood transfusion without reported diagnosis    Complication of anesthesia    CPAP (continuous positive airway pressure) dependence    GERD (gastroesophageal reflux disease)    Incontinence    Migraine    PONV (postoperative nausea and vomiting)    Restless leg syndrome 2015   R leg    Sleep apnea    UTI (urinary tract infection)     Current Outpatient Medications  Medication Sig Dispense Refill   acetaminophen (TYLENOL) 500 MG tablet Take 1 tablet by mouth in the morning and at bedtime.     calcium carbonate (TUMS - DOSED IN MG ELEMENTAL CALCIUM) 500 MG chewable tablet Chew 1 tablet by mouth daily.     Cholecalciferol (VITAMIN D3) 50 MCG (2000 UT) TABS Take 2,000 Units by mouth daily.     cyanocobalamin (,VITAMIN B-12,) 1000 MCG/ML injection Inject 1,000 mcg into the muscle every 30 (thirty) days.     Multiple Vitamin (MULTI-VITAMIN) tablet Take 1 tablet by mouth in the morning and at bedtime.     pantoprazole (PROTONIX) 40 MG tablet Take 1 tablet (40 mg total) by mouth daily. 90 tablet 1    traMADol (ULTRAM) 50 MG tablet Take 50 mg by mouth 3 (three) times daily as needed.     No current facility-administered medications for this visit.    Allergies as of 05/15/2023 - Review Complete 05/15/2023  Allergen Reaction Noted   Solifenacin Rash 11/08/2020   Morphine Nausea And Vomiting 08/12/2018    ROS:  General: Negative for anorexia, weight loss, fever, chills, fatigue, weakness. ENT: Negative for hoarseness, difficulty swallowing , nasal congestion. CV: Negative for chest pain, angina, palpitations, dyspnea on exertion, peripheral edema.  Respiratory: Negative for dyspnea at rest, dyspnea on exertion, cough, sputum, wheezing.  GI: See history of present illness. GU:  Negative for dysuria, hematuria, urinary incontinence, urinary frequency, nocturnal urination.  Endo: Negative for unusual weight change.    Physical Examination:   BP 125/66 (BP Location: Left Arm, Patient Position: Sitting, Cuff Size: Normal)   Pulse 82   Temp 97.9 F (36.6 C) (Oral)   Wt 117 lb (53.1 kg)   BMI 21.40 kg/m   General: Well-nourished, well-developed in no acute distress.  Eyes: No icterus. Conjunctivae pink. Lungs: Clear to auscultation bilaterally. Non-labored. Heart: Regular rate and rhythm, no murmurs rubs or gallops.  Abdomen: Bowel sounds are normal, nontender, nondistended, no hepatosplenomegaly or masses, no abdominal bruits or hernia , no rebound or guarding.   Extremities: No lower extremity edema.  No clubbing or deformities. Neuro: Alert and oriented x 3.  Grossly intact. Skin: Warm and dry, no jaundice.   Psych: Alert and cooperative, normal mood and affect.  Labs:    Imaging Studies: No results found.  Assessment and Plan:   Jocelyn Case is a 77 y.o. y/o female who comes in today with a history of GERD doing well on her pantoprazole.  The patient does not have any worry symptoms.  The patient will have her pantoprazole refilled.  The patient will follow-up as  needed.     Midge Minium, MD. Clementeen Graham    Note: This dictation was prepared with Dragon dictation along with smaller phrase technology. Any transcriptional errors that result from this process are unintentional.

## 2023-05-17 ENCOUNTER — Encounter (INDEPENDENT_AMBULATORY_CARE_PROVIDER_SITE_OTHER): Payer: Self-pay

## 2023-05-23 ENCOUNTER — Encounter: Payer: Self-pay | Admitting: Internal Medicine

## 2023-05-23 ENCOUNTER — Ambulatory Visit (INDEPENDENT_AMBULATORY_CARE_PROVIDER_SITE_OTHER): Payer: Medicare Other | Admitting: Internal Medicine

## 2023-05-23 ENCOUNTER — Telehealth: Payer: Self-pay | Admitting: Internal Medicine

## 2023-05-23 VITALS — BP 118/66 | HR 85 | Temp 97.6°F | Ht 62.0 in | Wt 116.2 lb

## 2023-05-23 DIAGNOSIS — R300 Dysuria: Secondary | ICD-10-CM | POA: Diagnosis not present

## 2023-05-23 DIAGNOSIS — N3090 Cystitis, unspecified without hematuria: Secondary | ICD-10-CM | POA: Diagnosis not present

## 2023-05-23 DIAGNOSIS — Z903 Acquired absence of stomach [part of]: Secondary | ICD-10-CM | POA: Diagnosis not present

## 2023-05-23 LAB — POCT URINALYSIS DIPSTICK
Bilirubin, UA: NEGATIVE
Glucose, UA: NEGATIVE
Ketones, UA: NEGATIVE
Nitrite, UA: NEGATIVE
Protein, UA: POSITIVE — AB
Spec Grav, UA: 1.025 (ref 1.010–1.025)
Urobilinogen, UA: 0.2 E.U./dL
pH, UA: 5.5 (ref 5.0–8.0)

## 2023-05-23 LAB — URINALYSIS, MICROSCOPIC ONLY

## 2023-05-23 MED ORDER — CIPROFLOXACIN HCL 250 MG PO TABS: 250.0000 mg | ORAL_TABLET | Freq: Two times a day (BID) | ORAL | 0 refills | Status: DC

## 2023-05-23 MED ORDER — CIPROFLOXACIN HCL 250 MG PO TABS: 250.0000 mg | ORAL_TABLET | Freq: Two times a day (BID) | ORAL | 0 refills | Status: AC

## 2023-05-23 NOTE — Assessment & Plan Note (Addendum)
UA is abnormal and culture is growing E Coli sensistive to cipro

## 2023-05-23 NOTE — Patient Instructions (Signed)
I am treating  you for a  Urinary tract infection  With ciprofloxacin.  please take it twice daily for 5 days  Please take a probiotic ( Align, Flora que or [employer]) for 2 weeks if you start the antibiotic to prevent a serious antibiotic associated diarrhea  Called" clostridium difficile colitis" ( should also help prevent   vaginal yeast infection)    I will notify you by Mychart of the results of the formal urinalysis and culture

## 2023-05-23 NOTE — Telephone Encounter (Signed)
Spoke to pt, pt would like to be seen. Scheduled for today at 1:15

## 2023-05-23 NOTE — Addendum Note (Signed)
Addended by: Sandy Salaam on: 05/23/2023 01:43 PM   Modules accepted: Orders

## 2023-05-23 NOTE — Telephone Encounter (Signed)
Patient just called and said she might have a UTI. She states she keep using the bathroom 7 and 8 times at night. And can't sleep at night. She wold like for someone to call her, to see what she could do. Her number is [phone].

## 2023-05-23 NOTE — Progress Notes (Signed)
Subjective:  Patient ID: Jocelyn Case, female    DOB: 08/09/1946  Age: 77 y.o. MRN: [id number]  CC: The primary encounter diagnosis was Dysuria. Diagnoses of Acquired absence of stomach and Cystitis were also pertinent to this visit.   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  Has not taken abx in over 6 months. Did not sleep much last night due to nocturia x 6.  No flank pain, fevers or hematuria.   Last UTI June 2022 , Klebsiella    UA notes  blood and leukocytes  Outpatient Medications Prior to Visit  Medication Sig Dispense Refill   acetaminophen (TYLENOL) 500 MG tablet Take 1 tablet by mouth in the morning and at bedtime.     calcium carbonate (TUMS - DOSED IN MG ELEMENTAL CALCIUM) 500 MG chewable tablet Chew 1 tablet by mouth daily.     Cholecalciferol (VITAMIN D3) 50 MCG (2000 UT) TABS Take 2,000 Units by mouth daily.     cyanocobalamin (,VITAMIN B-12,) 1000 MCG/ML injection Inject 1,000 mcg into the muscle every 30 (thirty) days.     Multiple Vitamin (MULTI-VITAMIN) tablet Take 1 tablet by mouth in the morning and at bedtime.     pantoprazole (PROTONIX) 40 MG tablet Take 1 tablet (40 mg total) by mouth daily. 90 tablet 3   traMADol (ULTRAM) 50 MG tablet Take 50 mg by mouth 3 (three) times daily as needed.     No facility-administered medications prior to visit.    Review of Systems;  Patient denies headache, fevers, malaise, unintentional weight loss, skin rash, eye pain, sinus congestion and sinus pain, sore throat, dysphagia,  hemoptysis , cough, dyspnea, wheezing, chest pain, palpitations, orthopnea, edema, abdominal pain, nausea, melena, diarrhea, constipation, flank  pain, dysuria, hematuria, urinary  Frequency, nocturia, numbness, tingling, seizures,  Focal weakness, Loss of consciousness,  Tremor, insomnia, depression, anxiety, and suicidal ideation.      Objective:  BP 118/66   Pulse 85   Temp 97.6 F (36.4 C) (Oral)   Ht 5\' 2"  (1.575 m)   Wt 116 lb 3.2 oz (52.7 kg)   SpO2 98%   BMI 21.25 kg/m   BP Readings from Last 3 Encounters:  05/23/23 118/66  05/15/23 125/66  03/06/23 120/62    Wt Readings from Last 3 Encounters:  05/23/23 116 lb 3.2 oz (52.7 kg)  05/15/23 117 lb (53.1 kg)  03/06/23 114 lb (51.7 kg)    Physical Exam Vitals reviewed.  Constitutional:      General: She is not in acute distress.    Appearance: Normal appearance. She is normal weight. She is not ill-appearing, toxic-appearing or diaphoretic.  HENT:     Head: Normocephalic.  Eyes:     General: No scleral icterus.       Right eye: No discharge.        Left eye: No discharge.     Conjunctiva/sclera: Conjunctivae normal.  Cardiovascular:     Rate and Rhythm: Normal rate and regular rhythm.     Heart sounds: Normal heart sounds.  Pulmonary:     Effort: Pulmonary effort is normal. No respiratory distress.  Breath sounds: Normal breath sounds.  Musculoskeletal:        General: Normal range of motion.  Skin:    General: Skin is warm and dry.  Neurological:     General: No focal deficit present.     Mental Status: She is alert and oriented to person, place, and time. Mental status is at baseline.  Psychiatric:        Mood and Affect: Mood normal.        Behavior: Behavior normal.        Thought Content: Thought content normal.        Judgment: Judgment normal.    No results found for: "HGBA1C"  Lab Results  Component Value Date   CREATININE 0.88 12/19/2022   CREATININE 0.85 11/23/2022   CREATININE 0.83 06/20/2022    Lab Results  Component Value Date   WBC 5.9 12/19/2022   HGB 11.6 (L) 12/19/2022   HCT 36.8 12/19/2022   PLT 217 12/19/2022    GLUCOSE 132 (H) 12/19/2022   CHOL 162 11/23/2022   TRIG 77.0 11/23/2022   HDL 63.30 11/23/2022   LDLCALC 83 11/23/2022   ALT 32 12/19/2022   AST 33 12/19/2022   NA 138 12/19/2022   K 4.2 12/19/2022   CL 107 12/19/2022   CREATININE 0.88 12/19/2022   BUN 44 (H) 12/19/2022   CO2 24 12/19/2022   TSH 0.502 12/30/2020    MM 3D DIAGNOSTIC MAMMOGRAM BILATERAL BREAST  Result Date: 03/27/2023 CLINICAL DATA:  Status post RIGHT implant explantation. BI-RADS 3 follow-up of a RIGHT breast asymmetry, initiated June 2023. EXAM: DIGITAL DIAGNOSTIC BILATERAL MAMMOGRAM WITH TOMOSYNTHESIS TECHNIQUE: Bilateral digital diagnostic mammography and breast tomosynthesis was performed. COMPARISON:  Previous exam(s). ACR Breast Density Category c: The breasts are heterogeneously dense, which may obscure small masses. FINDINGS: Diagnostic images of the RIGHT breast demonstrate mammographic stability of an asymmetry anterior to free silicone in the RIGHT inner breast. This is best appreciated on CC view. No new suspicious findings are noted. Sequela of free silicone are noted with similar postsurgical changes on MLO view as remote priors. No suspicious mass, distortion, or microcalcifications are identified to suggest presence of malignancy in the LEFT breast. IMPRESSION: 1. Stable probably benign RIGHT breast asymmetry best seen on CC view. Recommend follow-up diagnostic mammogram in 1 year. This will establish 2 years of definitive stability from tomosynthesis baseline exam. 2. No mammographic evidence of malignancy in the LEFT breast. RECOMMENDATION: Recommend bilateral diagnostic mammogram (with RIGHT and LEFT breast ultrasound if deemed necessary) in 1 year. Patient is due for contralateral screening at this point in time. I have discussed the findings and recommendations with the patient. If applicable, a reminder letter will be sent to the patient regarding the next appointment. BI-RADS CATEGORY  3: Probably benign.  Electronically Signed   By: Meda Klinefelter M.D.   On: 03/27/2023 11:15   Assessment & Plan:  .Dysuria -     POCT urinalysis dipstick -     Urine Culture -     Urine Microscopic  Acquired absence of stomach  Cystitis Assessment & Plan: UA is abnormal and culture is penidng.  Her symptoms are worsening.  Empiric cipro 250 mg bid x 5 , advise to check [facility] in the event that abx need to be changed    Other orders -     Ciprofloxacin HCl; Take 1 tablet (250 mg total) by mouth 2 (two) times daily for 5 days.  Dispense: 10 tablet; Refill: 0  Follow-up: No follow-ups on file.   Sherlene Shams, MD

## 2023-05-24 ENCOUNTER — Inpatient Hospital Stay: Payer: Medicare Other | Attending: Oncology

## 2023-05-24 DIAGNOSIS — E538 Deficiency of other specified B group vitamins: Secondary | ICD-10-CM | POA: Insufficient documentation

## 2023-05-24 DIAGNOSIS — D500 Iron deficiency anemia secondary to blood loss (chronic): Secondary | ICD-10-CM

## 2023-05-24 MED ORDER — CYANOCOBALAMIN 1000 MCG/ML IJ SOLN
1000.0000 ug | Freq: Once | INTRAMUSCULAR | Status: AC
  Administered 2023-05-24: 1000 ug via INTRAMUSCULAR
  Filled 2023-05-24: qty 1

## 2023-05-26 LAB — URINE CULTURE
MICRO NUMBER:: 15362108
SPECIMEN QUALITY:: ADEQUATE

## 2023-06-07 ENCOUNTER — Ambulatory Visit: Payer: Medicare Other

## 2023-06-11 ENCOUNTER — Ambulatory Visit: Payer: Medicare Other | Admitting: Internal Medicine

## 2023-06-12 ENCOUNTER — Telehealth: Payer: Self-pay | Admitting: *Deleted

## 2023-06-12 ENCOUNTER — Ambulatory Visit (INDEPENDENT_AMBULATORY_CARE_PROVIDER_SITE_OTHER): Payer: Medicare Other | Admitting: *Deleted

## 2023-06-12 VITALS — Ht 62.0 in | Wt 116.0 lb

## 2023-06-12 DIAGNOSIS — E162 Hypoglycemia, unspecified: Secondary | ICD-10-CM

## 2023-06-12 DIAGNOSIS — Z0000 Encounter for general adult medical examination without abnormal findings: Secondary | ICD-10-CM

## 2023-06-12 NOTE — Patient Instructions (Addendum)
Jocelyn Case , Thank you for taking time to come for your Medicare Wellness Visit. I appreciate your ongoing commitment to your health goals. Please review the following plan we discussed and let me know if I can assist you in the future.   Referrals/Orders/Follow-Ups/Clinician Recommendations: None  This is a list of the screening recommended for you and due dates:  Health Maintenance  Topic Date Due   Hepatitis C Screening  Never done   DTaP/Tdap/Td vaccine (1 - Tdap) Never done   Pneumonia Vaccine (1 of 1 - PCV) Never done   COVID-19 Vaccine (6 - 2023-24 season) 06/03/2023   Flu Shot  12/31/2023*   Mammogram  03/26/2024   Medicare Annual Wellness Visit  06/11/2024   DEXA scan (bone density measurement)  Completed   Zoster (Shingles) Vaccine  Completed   HPV Vaccine  Aged Out   Colon Cancer Screening  Discontinued  *Topic was postponed. The date shown is not the original due date.    Advanced directives: (Copy Requested) Please bring a copy of your health care power of attorney and living will to the office to be added to your chart at your convenience.  Next Medicare Annual Wellness Visit scheduled for next year: Yes 06/16/24 @ 10:30   Managing Pain Without Opioids Opioids are strong medicines used to treat moderate to severe pain. For some people, especially those who have long-term (chronic) pain, opioids may not be the best choice for pain management due to: Side effects like nausea, constipation, and sleepiness. The risk of addiction (opioid use disorder). The longer you take opioids, the greater your risk of addiction. Pain that lasts for more than 3 months is called chronic pain. Managing chronic pain usually requires more than one approach and is often provided by a team of health care providers working together (multidisciplinary approach). Pain management may be done at a pain management center or pain clinic. How to manage pain without the use of opioids Use non-opioid  medicines Non-opioid medicines for pain may include: Over-the-counter or prescription non-steroidal anti-inflammatory drugs (NSAIDs). These may be the first medicines used for pain. They work well for muscle and bone pain, and they reduce swelling. Acetaminophen. This over-the-counter medicine may work well for milder pain but not swelling. Antidepressants. These may be used to treat chronic pain. A certain type of antidepressant (tricyclics) is often used. These medicines are given in lower doses for pain than when used for depression. Anticonvulsants. These are usually used to treat seizures but may also reduce nerve (neuropathic) pain. Muscle relaxants. These relieve pain caused by sudden muscle tightening (spasms). You may also use a pain medicine that is applied to the skin as a patch, cream, or gel (topical analgesic), such as a numbing medicine. These may cause fewer side effects than medicines taken by mouth. Do certain therapies as directed Some therapies can help with pain management. They include: Physical therapy. You will do exercises to gain strength and flexibility. A physical therapist may teach you exercises to move and stretch parts of your body that are weak, stiff, or painful. You can learn these exercises at physical therapy visits and practice them at home. Physical therapy may also involve: Massage. Heat wraps or applying heat or cold to affected areas. Electrical signals that interrupt pain signals (transcutaneous electrical nerve stimulation, TENS). Weak lasers that reduce pain and swelling (low-level laser therapy). Signals from your body that help you learn to regulate pain (biofeedback). Occupational therapy. This helps you to learn ways  to function at home and work with less pain. Recreational therapy. This involves trying new activities or hobbies, such as a physical activity or drawing. Mental health therapy, including: Cognitive behavioral therapy (CBT). This helps  you learn coping skills for dealing with pain. Acceptance and commitment therapy (ACT) to change the way you think and react to pain. Relaxation therapies, including muscle relaxation exercises and mindfulness-based stress reduction. Pain management counseling. This may be individual, family, or group counseling.  Receive medical treatments Medical treatments for pain management include: Nerve block injections. These may include a pain blocker and anti-inflammatory medicines. You may have injections: Near the spine to relieve chronic back or neck pain. Into joints to relieve back or joint pain. Into nerve areas that supply a painful area to relieve body pain. Into muscles (trigger point injections) to relieve some painful muscle conditions. A medical device placed near your spine to help block pain signals and relieve nerve pain or chronic back pain (spinal cord stimulation device). Acupuncture. Follow these instructions at home Medicines Take over-the-counter and prescription medicines only as told by your health care provider. If you are taking pain medicine, ask your health care providers about possible side effects to watch out for. Do not drive or use heavy machinery while taking prescription opioid pain medicine. Lifestyle  Do not use drugs or alcohol to reduce pain. If you drink alcohol, limit how much you have to: 0-1 drink a day for women who are not pregnant. 0-2 drinks a day for men. Know how much alcohol is in a drink. In the [place] one drink equals one 12 oz bottle of beer (355 mL), one 5 oz glass of wine (148 mL), or one 1 oz glass of hard liquor (44 mL). Do not use any products that contain nicotine or tobacco. These products include cigarettes, chewing tobacco, and vaping devices, such as e-cigarettes. If you need help quitting, ask your health care provider. Eat a healthy diet and maintain a healthy weight. Poor diet and excess weight may make pain worse. Eat foods that  are high in fiber. These include fresh fruits and vegetables, whole grains, and beans. Limit foods that are high in fat and processed sugars, such as fried and sweet foods. Exercise regularly. Exercise lowers stress and may help relieve pain. Ask your health care provider what activities and exercises are safe for you. If your health care provider approves, join an exercise class that combines movement and stress reduction. Examples include yoga and tai chi. Get enough sleep. Lack of sleep may make pain worse. Lower stress as much as possible. Practice stress reduction techniques as told by your therapist. General instructions Work with all your pain management providers to find the treatments that work best for you. You are an important member of your pain management team. There are many things you can do to reduce pain on your own. Consider joining an online or in-person support group for people who have chronic pain. Keep all follow-up visits. This is important. Where to find more information You can find more information about managing pain without opioids from: American Academy of Pain Medicine: painmed.org Institute for Chronic Pain: instituteforchronicpain.org American Chronic Pain Association: theacpa.org Contact a health care provider if: You have side effects from pain medicine. Your pain gets worse or does not get better with treatments or home therapy. You are struggling with anxiety or depression. Summary Many types of pain can be managed without opioids. Chronic pain may respond better to pain management  without opioids. Pain is best managed when you and a team of health care providers work together. Pain management without opioids may include non-opioid medicines, medical treatments, physical therapy, mental health therapy, and lifestyle changes. Tell your health care providers if your pain gets worse or is not being managed well enough. This information is not intended to  replace advice given to you by your health care provider. Make sure you discuss any questions you have with your health care provider. Document Revised: 12/29/2020 Document Reviewed: 12/29/2020 Elsevier Patient Education  2024 [employer].

## 2023-06-12 NOTE — Telephone Encounter (Signed)
Called patient to perform her Annual [employer].  Patient stated that about a month ago she was not feeling well and a little jittery. Patient stated that she checked her blood sugar with a meter that she has on hand. Patient stated that her blood sugar on two different occasions was low. Patient stated one time it was 9 and another time 48. Patient stated that she feels fine now but wants to make sure that she can get er A1C checked the next tine she is the office. Patient has an upcoming appointment scheduled In October.

## 2023-06-12 NOTE — Telephone Encounter (Signed)
I am ok with checking labs, but if having issues, can schedule an earlier appt to evaluate.  Need to make sure eating regular meals and getting adequate protein.  Can add protein snack in between meals.

## 2023-06-12 NOTE — Telephone Encounter (Signed)
Lab ordered added for A1c (needs this lab and the remainder of fasting labs ordered).

## 2023-06-12 NOTE — Progress Notes (Signed)
Subjective:   Jocelyn Case is a 77 y.o. female who presents for Medicare Annual (Subsequent) preventive examination.  Visit Complete: Virtual  I connected with  Jocelyn Case on 06/12/23 by a audio enabled telemedicine application and verified that I am speaking with the correct person using two identifiers.  Patient Location: Home  Provider Location: Office/Clinic  I discussed the limitations of evaluation and management by telemedicine. The patient expressed understanding and agreed to proceed.  Vital Signs: Unable to obtain new vitals due to this being a telehealth visit.  Review of Systems     Cardiac Risk Factors include: advanced age (>65men, >31 women)     Objective:    Today's Vitals   06/12/23 1030  Weight: 116 lb (52.6 kg)  Height: 5\' 2"  (1.575 m)   Body mass index is 21.22 kg/m.     06/12/2023   10:45 AM 12/21/2022    2:47 PM 06/20/2022    1:53 PM 06/06/2022    2:06 PM 03/20/2022    1:31 PM 01/10/2022   10:18 AM 11/17/2021    1:54 PM  Advanced Directives  Does Patient Have a Medical Advance Directive? Yes Yes Yes Yes Yes Yes Yes  Type of Estate agent of [place];Living will Healthcare Power of [place];Living will Healthcare Power of [employer] of [place];Living will     Does patient want to make changes to medical advance directive?    No - Patient declined     Copy of Healthcare Power of Attorney in Chart? No - copy requested  No - copy requested No - copy requested       Current Medications (verified) Outpatient Encounter Medications as of 06/12/2023  Medication Sig   acetaminophen (TYLENOL) 500 MG tablet Take 1 tablet by mouth in the morning and at bedtime.   calcium carbonate (TUMS - DOSED IN MG ELEMENTAL CALCIUM) 500 MG chewable tablet Chew 1 tablet by mouth daily.   Calcium-Vitamin D-Vitamin K (CALCIUM + D + K) 750-500-40 MG-UNT-MCG TABS Take by mouth in the morning and at bedtime.   Cholecalciferol (VITAMIN D3) 50 MCG  (2000 UT) TABS Take 2,000 Units by mouth daily.   cyanocobalamin (,VITAMIN B-12,) 1000 MCG/ML injection Inject 1,000 mcg into the muscle every 30 (thirty) days.   Multiple Vitamin (MULTI-VITAMIN) tablet Take 1 tablet by mouth in the morning and at bedtime.   pantoprazole (PROTONIX) 40 MG tablet Take 1 tablet (40 mg total) by mouth daily.   traMADol (ULTRAM) 50 MG tablet Take 50 mg by mouth 3 (three) times daily as needed.   No facility-administered encounter medications on file as of 06/12/2023.    Allergies (verified) Solifenacin and Morphine   History: Past Medical History:  Diagnosis Date   Anemia    Arthritis    Blood transfusion without reported diagnosis    Complication of anesthesia    CPAP (continuous positive airway pressure) dependence    GERD (gastroesophageal reflux disease)    Incontinence    Migraine    PONV (postoperative nausea and vomiting)    Restless leg syndrome 2015   R leg    Sleep apnea    UTI (urinary tract infection)    Past Surgical History:  Procedure Laterality Date   ABDOMINAL HYSTERECTOMY     APPENDECTOMY     AUGMENTATION MAMMAPLASTY Bilateral 1974   silicone removed 2014/ right side ruptured   BREAST EXCISIONAL BIOPSY Right    neg   CHOLECYSTECTOMY     COLONOSCOPY  2  years ago   COLONOSCOPY WITH PROPOFOL N/A 01/10/2022   Procedure: COLONOSCOPY WITH PROPOFOL;  Surgeon: Midge Minium, MD;  Location: [facility] ENDOSCOPY;  Service: Endoscopy;  Laterality: N/A;   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   not digesting good, had to have work on duodenal per pt   ESOPHAGOGASTRODUODENOSCOPY (EGD) WITH PROPOFOL N/A 02/14/2018   Procedure: ESOPHAGOGASTRODUODENOSCOPY (EGD) WITH PROPOFOL;  Surgeon: Pasty Spillers, MD;  Location: [facility] ENDOSCOPY;  Service: Endoscopy;  Laterality: N/A;   ESOPHAGOGASTRODUODENOSCOPY (EGD) WITH PROPOFOL N/A 02/15/2018   Procedure: ESOPHAGOGASTRODUODENOSCOPY (EGD) WITH PROPOFOL;  Surgeon: Pasty Spillers, MD;  Location: [facility]  ENDOSCOPY;  Service: Endoscopy;  Laterality: N/A;   ESOPHAGOGASTRODUODENOSCOPY (EGD) WITH PROPOFOL N/A 07/05/2018   Procedure: ESOPHAGOGASTRODUODENOSCOPY (EGD) WITH PROPOFOL;  Surgeon: Pasty Spillers, MD;  Location: [facility] ENDOSCOPY;  Service: Endoscopy;  Laterality: N/A;   STOMACH SURGERY  2013   UPPER GASTROINTESTINAL ENDOSCOPY  05/2017   pt stated "bleeding ulcer and stopped up duodenum was noted"   Family History  Problem Relation Age of Onset   Peptic Ulcer Disease Mother    Dementia Mother    COPD Father    Colon cancer Father    Breast cancer Neg Hx    Social History   Socioeconomic History   Marital status: Widowed    Spouse name: Not on file   Number of children: Not on file   Years of education: Not on file   Highest education level: Not on file  Occupational History   Not on file  Tobacco Use   Smoking status: Former    Current packs/day: 0.00    Types: Cigarettes    Quit date: 10/03/1974    Years since quitting: 48.7   Smokeless tobacco: Never  Vaping Use   Vaping status: Never Used  Substance and Sexual Activity   Alcohol use: Yes    Alcohol/week: 1.0 standard drink of alcohol    Types: 1 Glasses of wine per week    Comment: every night   Drug use: No   Sexual activity: Never  Other Topics Concern   Not on file  Social History Narrative   widow   Social Determinants of Health   Financial Resource Strain: Low Risk  (06/12/2023)   Overall Financial Resource Strain (CARDIA)    Difficulty of Paying Living Expenses: Not hard at all  Food Insecurity: No Food Insecurity (06/12/2023)   Hunger Vital Sign    Worried About Running Out of Food in the Last Year: Never true    Ran Out of Food in the Last Year: Never true  Transportation Needs: No Transportation Needs (06/12/2023)   PRAPARE - Administrator, Civil Service (Medical): No    Lack of Transportation (Non-Medical): No  Physical Activity: Inactive (06/12/2023)   Exercise Vital Sign    Days  of Exercise per Week: 0 days    Minutes of Exercise per Session: 0 min  Stress: No Stress Concern Present (06/12/2023)   [employer] of Occupational Health - Occupational Stress Questionnaire    Feeling of Stress : Not at all  Social Connections: Moderately Isolated (06/12/2023)   Social Connection and Isolation Panel [NHANES]    Frequency of Communication with Friends and Family: More than three times a week    Frequency of Social Gatherings with Friends and Family: More than three times a week    Attends Religious Services: Never    Database administrator or Organizations: Yes    Attends Club or  Organization Meetings: More than 4 times per year    Marital Status: Widowed    Tobacco Counseling Counseling given: Not Answered   Clinical Intake:  Pre-visit preparation completed: Yes  Pain : No/denies pain     BMI - recorded: 21.22 Nutritional Status: BMI of 19-24  Normal Nutritional Risks: None Diabetes: No  How often do you need to have someone help you when you read instructions, pamphlets, or other written materials from your doctor or pharmacy?: 1 - Never  Interpreter Needed?: No  Information entered by :: R. Kayia Billinger LPN   Activities of Daily Living    06/12/2023   10:31 AM  In your present state of health, do you have any difficulty performing the following activities:  Hearing? 1  Comment wears aids  Vision? 0  Comment glasses  Difficulty concentrating or making decisions? 0  Walking or climbing stairs? 0  Dressing or bathing? 0  Doing errands, shopping? 0  Preparing Food and eating ? N  Using the Toilet? N  In the past six months, have you accidently leaked urine? Y  Comment wears a pad  Do you have problems with loss of bowel control? N  Managing your Medications? N  Managing your Finances? N  Housekeeping or managing your Housekeeping? N    Patient Care Team: Dale [place], MD as PCP - General (Internal Medicine) Pasty Spillers, MD  (Inactive) as Consulting Physician (Gastroenterology) Chevis Pretty Burtis Junes, MD as Consulting Physician (Gastroenterology)  Indicate any recent Medical Services you may have received from other than [facility] providers in the past year (date may be approximate).     Assessment:   This is a routine wellness examination for [place].  Hearing/Vision screen Hearing Screening - Comments:: Wears aids Vision Screening - Comments:: glasses   Goals Addressed             This Visit's Progress    Patient Stated       Exercise more        Depression Screen    06/12/2023   10:39 AM 10/31/2022    1:34 PM 07/31/2022    1:19 PM 06/07/2022    7:09 AM 06/06/2022    2:19 PM 06/06/2022    2:16 PM 03/30/2022    1:32 PM  PHQ 2/9 Scores  PHQ - 2 Score 0 0 0 0 2 0 0  PHQ- 9 Score 2  3  2       Fall Risk    06/12/2023   10:34 AM 05/23/2023    1:18 PM 10/31/2022    1:34 PM 07/31/2022    1:19 PM 06/07/2022    7:09 AM  Fall Risk   Falls in the past year? 1 1 0 0 0  Number falls in past yr: 0 0 0 0   Injury with Fall? 1 0 0 0   Comment injured leg and face      Risk for fall due to : History of fall(s);Impaired balance/gait History of fall(s) No Fall Risks No Fall Risks   Follow up Falls evaluation completed;Falls prevention discussed Falls evaluation completed Falls evaluation completed Falls evaluation completed     MEDICARE RISK AT HOME: Medicare Risk at Home Any stairs in or around the home?: No If so, are there any without handrails?: No Home free of loose throw rugs in walkways, pet beds, electrical cords, etc?: Yes Adequate lighting in your home to reduce risk of falls?: Yes Life alert?: Yes Use of a cane, walker or w/c?:  No Grab bars in the bathroom?: Yes Shower chair or bench in shower?: Yes Elevated toilet seat or a handicapped toilet?: Yes   Cognitive Function:        06/12/2023   10:46 AM 06/06/2022    2:39 PM  6CIT Screen  What Year? 0 points 0 points  What month? 0 points 0 points   What time? 0 points 0 points  Count back from 20 0 points 0 points  Months in reverse 0 points 0 points  Repeat phrase 0 points 0 points  Total Score 0 points 0 points    Immunizations Immunization History  Administered Date(s) Administered   Influenza Inj Mdck Quad Pf 07/31/2017, 06/25/2019   Influenza, High Dose Seasonal PF 07/31/2017, 07/11/2018, 06/25/2019, 06/22/2020, 06/22/2020, 06/21/2021   Influenza-Unspecified 07/31/2017, 07/02/2022   PFIZER Comirnaty(Gray Top)Covid-19 Tri-Sucrose Vaccine 10/25/2019, 11/15/2019, 02/23/2021   PFIZER(Purple Top)SARS-COV-2 Vaccination 07/21/2020   Pfizer Covid-19 Vaccine Bivalent Booster 3yrs & up 07/02/2022   Zoster Recombinant(Shingrix) 03/08/2021, 05/07/2021    TDAP status: Due, Education has been provided regarding the importance of this vaccine. Advised may receive this vaccine at local pharmacy or Health Dept. Aware to provide a copy of the vaccination record if obtained from local pharmacy or Health Dept. Verbalized acceptance and understanding.  Flu Vaccine status: Up to date  Pneumococcal vaccine status: Due, Education has been provided regarding the importance of this vaccine. Advised may receive this vaccine at local pharmacy or Health Dept. Aware to provide a copy of the vaccination record if obtained from local pharmacy or Health Dept. Verbalized acceptance and understanding.  Covid-19 vaccine status: Completed vaccines  Qualifies for Shingles Vaccine? Yes   Zostavax completed No   Shingrix Completed?: Yes  Screening Tests Health Maintenance  Topic Date Due   Hepatitis C Screening  Never done   DTaP/Tdap/Td (1 - Tdap) Never done   Pneumonia Vaccine 30+ Years old (1 of 1 - PCV) Never done   COVID-19 Vaccine (6 - 2023-24 season) 06/03/2023   Medicare Annual Wellness (AWV)  06/07/2023   INFLUENZA VACCINE  12/31/2023 (Originally 05/03/2023)   DEXA SCAN  Completed   Zoster Vaccines- Shingrix  Completed   HPV VACCINES  Aged Out    Colonoscopy  Discontinued    Health Maintenance  Health Maintenance Due  Topic Date Due   Hepatitis C Screening  Never done   DTaP/Tdap/Td (1 - Tdap) Never done   Pneumonia Vaccine 5+ Years old (1 of 1 - PCV) Never done   COVID-19 Vaccine (6 - 2023-24 season) 06/03/2023   Medicare Annual Wellness (AWV)  06/07/2023    Colorectal cancer screening: No longer required.   Mammogram status: Completed 6/24. Repeat every year  Bone Density status: Completed 10/23. Results reflect: Bone density results: OSTEOPOROSIS. Repeat every 2 years.  Lung Cancer Screening: (Low Dose CT Chest recommended if Age 63-80 years, 20 pack-year currently smoking OR have quit w/in 15years.) does not qualify.     Additional Screening:  Hepatitis C Screening: does not qualify; Completed NA age  Vision Screening: Recommended annual ophthalmology exams for early detection of glaucoma and other disorders of the eye. Is the patient up to date with their annual eye exam?  Yes  Who is the provider or what is the name of the office in which the patient attends annual eye exams? [facility] If pt is not established with a provider, would they like to be referred to a provider to establish care? No .   Dental Screening: Recommended  annual dental exams for proper oral hygiene   Community Resource Referral / Chronic Care Management: CRR required this visit?  No   CCM required this visit?  No     Plan:     I have personally reviewed and noted the following in the patient's chart:   Medical and social history Use of alcohol, tobacco or illicit drugs  Current medications and supplements including opioid prescriptions. Patient is currently taking opioid prescriptions. Information provided to patient regarding non-opioid alternatives. Patient advised to discuss non-opioid treatment plan with their provider. Functional ability and status Nutritional status Physical activity Advanced directives List of  other physicians Hospitalizations, surgeries, and ER visits in previous 12 months Vitals Screenings to include cognitive, depression, and falls Referrals and appointments  In addition, I have reviewed and discussed with patient certain preventive protocols, quality metrics, and best practice recommendations. A written personalized care plan for preventive services as well as general preventive health recommendations were provided to patient.     Sydell Axon, LPN   [id number]   After Visit Summary: (MyChart) Due to this being a telephonic visit, the after visit summary with patients personalized plan was offered to patient via MyChart   Nurse Notes: None

## 2023-06-12 NOTE — Telephone Encounter (Signed)
Spoke with patient. She does not want to come in for earlier appointment. Wants to have labs done prior to her appointment. She has future fasting labs in. Do you want to add A1c? She is eating regular meals and getting adequate protein. Has protein bars that she eats twice a day for snack.

## 2023-06-13 NOTE — Telephone Encounter (Signed)
Noted  

## 2023-06-22 ENCOUNTER — Other Ambulatory Visit: Payer: Medicare Other

## 2023-06-26 ENCOUNTER — Ambulatory Visit: Payer: Medicare Other | Admitting: Oncology

## 2023-06-26 ENCOUNTER — Ambulatory Visit: Payer: Medicare Other

## 2023-06-29 ENCOUNTER — Other Ambulatory Visit (INDEPENDENT_AMBULATORY_CARE_PROVIDER_SITE_OTHER): Payer: Medicare Other

## 2023-06-29 ENCOUNTER — Inpatient Hospital Stay: Payer: Medicare Other | Attending: Oncology

## 2023-06-29 DIAGNOSIS — D509 Iron deficiency anemia, unspecified: Secondary | ICD-10-CM | POA: Diagnosis not present

## 2023-06-29 DIAGNOSIS — E538 Deficiency of other specified B group vitamins: Secondary | ICD-10-CM | POA: Diagnosis present

## 2023-06-29 DIAGNOSIS — G4733 Obstructive sleep apnea (adult) (pediatric): Secondary | ICD-10-CM

## 2023-06-29 DIAGNOSIS — E162 Hypoglycemia, unspecified: Secondary | ICD-10-CM | POA: Diagnosis not present

## 2023-06-29 DIAGNOSIS — Z13220 Encounter for screening for lipoid disorders: Secondary | ICD-10-CM

## 2023-06-29 DIAGNOSIS — M810 Age-related osteoporosis without current pathological fracture: Secondary | ICD-10-CM

## 2023-06-29 DIAGNOSIS — D500 Iron deficiency anemia secondary to blood loss (chronic): Secondary | ICD-10-CM

## 2023-06-29 LAB — IRON AND TIBC
Iron: 36 ug/dL (ref 28–170)
Saturation Ratios: 10 % — ABNORMAL LOW (ref 10.4–31.8)
TIBC: 353 ug/dL (ref 250–450)
UIBC: 317 ug/dL

## 2023-06-29 LAB — BASIC METABOLIC PANEL
BUN: 31 mg/dL — ABNORMAL HIGH (ref 6–23)
CO2: 25 meq/L (ref 19–32)
Calcium: 9 mg/dL (ref 8.4–10.5)
Chloride: 107 meq/L (ref 96–112)
Creatinine, Ser: 0.84 mg/dL (ref 0.40–1.20)
GFR: 65.99 mL/min (ref >60.00)
Glucose, Bld: 95 mg/dL (ref 70–99)
Potassium: 4.8 meq/L (ref 3.5–5.1)
Sodium: 141 meq/L (ref 135–145)

## 2023-06-29 LAB — CBC WITH DIFFERENTIAL (CANCER CENTER ONLY)
Abs Immature Granulocytes: 0.01 10*3/uL (ref 0.00–0.07)
Basophils Absolute: 0 10*3/uL (ref 0.0–0.1)
Basophils Relative: 1 %
Eosinophils Absolute: 0.3 10*3/uL (ref 0.0–0.5)
Eosinophils Relative: 9 %
HCT: 37.5 % (ref 36.0–46.0)
Hemoglobin: 11.7 g/dL — ABNORMAL LOW (ref 12.0–15.0)
Immature Granulocytes: 0 %
Lymphocytes Relative: 21 %
Lymphs Abs: 0.8 10*3/uL (ref 0.7–4.0)
MCH: 32.1 pg (ref 26.0–34.0)
MCHC: 31.2 g/dL (ref 30.0–36.0)
MCV: 103 fL — ABNORMAL HIGH (ref 80.0–100.0)
Monocytes Absolute: 0.4 10*3/uL (ref 0.1–1.0)
Monocytes Relative: 10 %
Neutro Abs: 2.2 10*3/uL (ref 1.7–7.7)
Neutrophils Relative %: 59 %
Platelet Count: 187 10*3/uL (ref 150–400)
RBC: 3.64 MIL/uL — ABNORMAL LOW (ref 3.87–5.11)
RDW: 13.6 % (ref 11.5–15.5)
WBC Count: 3.6 10*3/uL — ABNORMAL LOW (ref 4.0–10.5)
nRBC: 0 % (ref 0.0–0.2)

## 2023-06-29 LAB — HEPATIC FUNCTION PANEL
ALT: 36 U/L — ABNORMAL HIGH (ref 0–35)
AST: 32 U/L (ref 0–37)
Albumin: 4 g/dL (ref 3.5–5.2)
Alkaline Phosphatase: 98 U/L (ref 39–117)
Bilirubin, Direct: 0.1 mg/dL (ref 0.0–0.3)
Total Bilirubin: 0.3 mg/dL (ref 0.2–1.2)
Total Protein: 6.5 g/dL (ref 6.0–8.3)

## 2023-06-29 LAB — LIPID PANEL
Cholesterol: 173 mg/dL (ref 0–200)
HDL: 69.3 mg/dL (ref >39.00)
LDL Cholesterol: 83 mg/dL (ref 0–99)
NonHDL: 104.17
Total CHOL/HDL Ratio: 3
Triglycerides: 107 mg/dL (ref 0.0–149.0)
VLDL: 21.4 mg/dL (ref 0.0–40.0)

## 2023-06-29 LAB — VITAMIN B12: Vitamin B-12: 520 pg/mL (ref 180–914)

## 2023-06-29 LAB — HEMOGLOBIN A1C: Hgb A1c MFr Bld: 6 % (ref 4.6–6.5)

## 2023-06-29 LAB — FERRITIN: Ferritin: 130 ng/mL (ref 11–307)

## 2023-07-03 ENCOUNTER — Inpatient Hospital Stay: Payer: Medicare Other

## 2023-07-03 ENCOUNTER — Inpatient Hospital Stay: Payer: Medicare Other | Attending: Oncology | Admitting: Oncology

## 2023-07-03 ENCOUNTER — Encounter: Payer: Self-pay | Admitting: Oncology

## 2023-07-03 ENCOUNTER — Inpatient Hospital Stay: Payer: Medicare Other | Admitting: Oncology

## 2023-07-03 VITALS — BP 124/60 | HR 75 | Temp 97.6°F | Resp 18 | Wt 120.5 lb

## 2023-07-03 VITALS — BP 120/51 | HR 72 | Temp 97.8°F

## 2023-07-03 DIAGNOSIS — D500 Iron deficiency anemia secondary to blood loss (chronic): Secondary | ICD-10-CM

## 2023-07-03 DIAGNOSIS — E538 Deficiency of other specified B group vitamins: Secondary | ICD-10-CM | POA: Insufficient documentation

## 2023-07-03 DIAGNOSIS — Z79899 Other long term (current) drug therapy: Secondary | ICD-10-CM | POA: Insufficient documentation

## 2023-07-03 DIAGNOSIS — D7589 Other specified diseases of blood and blood-forming organs: Secondary | ICD-10-CM | POA: Diagnosis not present

## 2023-07-03 MED ORDER — SODIUM CHLORIDE 0.9 % IV SOLN
200.0000 mg | Freq: Once | INTRAVENOUS | Status: AC
  Administered 2023-07-03: 200 mg via INTRAVENOUS
  Filled 2023-07-03: qty 200

## 2023-07-03 MED ORDER — CYANOCOBALAMIN 1000 MCG/ML IJ SOLN
1000.0000 ug | Freq: Once | INTRAMUSCULAR | Status: AC
  Administered 2023-07-03: 1000 ug via INTRAMUSCULAR
  Filled 2023-07-03: qty 1

## 2023-07-03 MED ORDER — SODIUM CHLORIDE 0.9 % IV SOLN
INTRAVENOUS | Status: DC
  Filled 2023-07-03: qty 250

## 2023-07-03 NOTE — Assessment & Plan Note (Addendum)
Labs reviewed and discussed with patient. Lab Results  Component Value Date   HGB 11.7 (L) 06/29/2023   TIBC 353 06/29/2023   IRONPCTSAT 10 (L) 06/29/2023   FERRITIN 130 06/29/2023     Recommend Venofer weekly x 2 for maintenance given her history of gastrectomy.

## 2023-07-03 NOTE — Assessment & Plan Note (Signed)
B12 level is stable at 470. Continue monthly vitamin B12 injection

## 2023-07-03 NOTE — Assessment & Plan Note (Signed)
MCV is stable.  Continue to monitor counts.

## 2023-07-03 NOTE — Progress Notes (Signed)
Hematology/Oncology Progress note [phone] [phone]     Chief Complaint: Jocelyn Case is a 77 y.o. female presents for follow up of B12 deficiency and iron deficiency, s/p partial gastrectomy in 2009  ASSESSMENT & PLAN:   Iron deficiency anemia due to chronic blood loss Labs reviewed and discussed with patient. Lab Results  Component Value Date   HGB 11.7 (L) 06/29/2023   TIBC 353 06/29/2023   IRONPCTSAT 10 (L) 06/29/2023   FERRITIN 130 06/29/2023     Recommend Venofer weekly x 2 for maintenance given her history of gastrectomy.  B12 deficiency B12 level is stable at 470. Continue monthly vitamin B12 injection  Macrocytosis MCV is stable.  Continue to monitor counts.   No orders of the defined types were placed in this encounter.  Follow-up plan B12 monthly x 5 Lab in 6 months, iron labs, B12, folate, cmp prior to MD + B12 inj/ Venofer.   All questions were answered. The patient knows to call the clinic with any problems, questions or concerns.  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 On EGD she also has an additional small bowel anastomosis. 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  Her symptoms were worsening since removal of the stent and had daily emesis and decreased oral intake. 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  INTERVAL HISTORY Jocelyn Case is a 77 y.o. female who has above history reviewed by me  today presents for follow up visit for management of iron deficiency anemia, B12 deficiency. Patient reports fatigue is slightly better  Otherwise no new complaints.  Patient has been on monthly vitamin B12 injection.  Tolerates well. Denies hematochezia, hematuria, hematemesis, epistaxis, black tarry stool or easy bruising.    Past Medical History:  Diagnosis Date   Anemia    Arthritis    Blood transfusion without reported diagnosis    Complication of anesthesia    CPAP (continuous positive airway pressure) dependence    GERD (gastroesophageal reflux disease)    Incontinence    Migraine    PONV (postoperative nausea and vomiting)    Restless leg syndrome 2015   R leg    Sleep apnea    UTI (urinary tract infection)     Past Surgical History:  Procedure Laterality Date   ABDOMINAL HYSTERECTOMY     APPENDECTOMY     AUGMENTATION MAMMAPLASTY Bilateral 1974   silicone removed 2014/ right side ruptured   BREAST EXCISIONAL BIOPSY Right    neg   CHOLECYSTECTOMY     COLONOSCOPY  2 years ago   COLONOSCOPY WITH PROPOFOL N/A 01/10/2022   Procedure: COLONOSCOPY WITH PROPOFOL;  Surgeon: Midge Minium, MD;  Location: [facility] ENDOSCOPY;  Service: Endoscopy;  Laterality: N/A;   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   not digesting good, had to have work on duodenal per pt   ESOPHAGOGASTRODUODENOSCOPY (EGD) WITH PROPOFOL N/A 02/14/2018   Procedure: ESOPHAGOGASTRODUODENOSCOPY (EGD) WITH PROPOFOL;  Surgeon: Pasty Spillers, MD;  Location: [facility] ENDOSCOPY;  Service: Endoscopy;  Laterality: N/A;   ESOPHAGOGASTRODUODENOSCOPY (EGD) WITH PROPOFOL N/A 02/15/2018   Procedure: ESOPHAGOGASTRODUODENOSCOPY (EGD)  WITH PROPOFOL;  Surgeon: Pasty Spillers, MD;  Location: [facility] ENDOSCOPY;  Service: Endoscopy;  Laterality: N/A;   ESOPHAGOGASTRODUODENOSCOPY (EGD) WITH PROPOFOL N/A 07/05/2018   Procedure: ESOPHAGOGASTRODUODENOSCOPY (EGD) WITH PROPOFOL;  Surgeon: Pasty Spillers, MD;  Location: [facility]  ENDOSCOPY;  Service: Endoscopy;  Laterality: N/A;   STOMACH SURGERY  2013   UPPER GASTROINTESTINAL ENDOSCOPY  05/2017   pt stated "bleeding ulcer and stopped up duodenum was noted"    Family History  Problem Relation Age of Onset   Peptic Ulcer Disease Mother    Dementia Mother    COPD Father    Colon cancer Father    Breast cancer Neg Hx     Social History:  reports that she quit smoking about 48 years ago. Her smoking use included cigarettes. She has never used smokeless tobacco. She reports current alcohol use of about 1.0 standard drink of alcohol per week. She reports that she does not use drugs.   Allergies:  Allergies  Allergen Reactions   Solifenacin Rash   Morphine Nausea And Vomiting    Current Medications: Current Outpatient Medications  Medication Sig Dispense Refill   acetaminophen (TYLENOL) 500 MG tablet Take 1 tablet by mouth in the morning and at bedtime.     Calcium-Vitamin D-Vitamin K (CALCIUM + D + K) 750-500-40 MG-UNT-MCG TABS Take by mouth in the morning and at bedtime.     Cholecalciferol (VITAMIN D3) 50 MCG (2000 UT) TABS Take 2,000 Units by mouth daily.     cyanocobalamin (,VITAMIN B-12,) 1000 MCG/ML injection Inject 1,000 mcg into the muscle every 30 (thirty) days.     Multiple Vitamin (MULTI-VITAMIN) tablet Take 1 tablet by mouth in the morning and at bedtime.     pantoprazole (PROTONIX) 40 MG tablet Take 1 tablet (40 mg total) by mouth daily. 90 tablet 3   Romosozumab-aqqg (EVENITY) 105 MG/1. SOSY injection Inject 210 mg into the skin every 30 (thirty) days.     traMADol (ULTRAM) 50 MG tablet Take 50 mg by mouth 3 (three) times daily as needed.     calcium carbonate (TUMS - DOSED IN MG ELEMENTAL CALCIUM) 500 MG chewable tablet Chew 1 tablet by mouth daily. (Patient not taking: Reported on 07/03/2023)     No current facility-administered medications for this visit.     Performance status (ECOG): 0-1  Vitals Blood pressure 124/60, pulse 75,  temperature 97.6 F (36.4 C), resp. rate 18, weight 120 lb 8 oz (54.7 kg).   Physical Exam Constitutional:      General: She is not in acute distress.    Appearance: She is not diaphoretic.  HENT:     Head: Normocephalic and atraumatic.     Nose: Nose normal.     Mouth/Throat:     Pharynx: No oropharyngeal exudate.  Eyes:     General: No scleral icterus.    Pupils: Pupils are equal, round, and reactive to light.  Cardiovascular:     Rate and Rhythm: Normal rate and regular rhythm.     Heart sounds: No murmur heard. Pulmonary:     Effort: Pulmonary effort is normal. No respiratory distress.     Breath sounds: No rales.  Chest:     Chest wall: No tenderness.  Abdominal:     General: There is no distension.     Palpations: Abdomen is soft.     Tenderness: There is no abdominal tenderness.  Musculoskeletal:        General: Normal range of motion.  Cervical back: Normal range of motion and neck supple.  Skin:    General: Skin is warm and dry.     Findings: No erythema.  Neurological:     Mental Status: She is alert and oriented to person, place, and time.     Cranial Nerves: No cranial nerve deficit.     Motor: No abnormal muscle tone.     Coordination: Coordination normal.  Psychiatric:        Mood and Affect: Mood and affect normal.     Labs    Latest Ref Rng & Units 06/29/2023    9:53 AM 12/19/2022    1:58 PM 06/20/2022    1:22 PM  CBC  WBC 4.0 - 10.5 K/uL 3.6  5.9  4.4   Hemoglobin 12.0 - 15.0 g/dL 09.8  11.9  14.7   Hematocrit 36.0 - 46.0 % 37.5  36.8  37.6   Platelets 150 - 400 K/uL 187  217  212       Latest Ref Rng & Units 06/29/2023    9:22 AM 12/19/2022    1:58 PM 11/23/2022    9:24 AM  CMP  Glucose 70 - 99 mg/dL 95  829  94   BUN 6 - 23 mg/dL 31  44  30   Creatinine 0.40 - 1.20 mg/dL 5.62  1.30  8.65   Sodium 135 - 145 mEq/L 141  138  142   Potassium 3.5 - 5.1 mEq/L 4.8  4.2  4.6   Chloride 96 - 112 mEq/L 107  107  105   CO2 19 - 32 mEq/L 25  24   29    Calcium 8.4 - 10.5 mg/dL 9.0  8.7  9.4   Total Protein 6.0 - 8.3 g/dL 6.5  6.5  6.2   Total Bilirubin 0.2 - 1.2 mg/dL 0.3  0.4  0.3   Alkaline Phos 39 - 117 U/L 98  62  69   AST 0 - 37 U/L 32  33  25   ALT 0 - 35 U/L 36  32  27

## 2023-07-03 NOTE — Progress Notes (Signed)
Patient declined to wait the 30 minutes for post iron infusion observation today. Tolerated infusion well. VSS. 

## 2023-07-04 ENCOUNTER — Telehealth: Payer: Self-pay | Admitting: Internal Medicine

## 2023-07-04 ENCOUNTER — Ambulatory Visit (INDEPENDENT_AMBULATORY_CARE_PROVIDER_SITE_OTHER): Payer: Medicare Other | Admitting: Internal Medicine

## 2023-07-04 VITALS — BP 128/70 | HR 88 | Temp 97.9°F | Resp 16 | Ht 62.0 in | Wt 120.4 lb

## 2023-07-04 DIAGNOSIS — G4733 Obstructive sleep apnea (adult) (pediatric): Secondary | ICD-10-CM

## 2023-07-04 DIAGNOSIS — D500 Iron deficiency anemia secondary to blood loss (chronic): Secondary | ICD-10-CM | POA: Diagnosis not present

## 2023-07-04 DIAGNOSIS — Z820 Family history of epilepsy and other diseases of the nervous system: Secondary | ICD-10-CM | POA: Diagnosis not present

## 2023-07-04 DIAGNOSIS — E7800 Pure hypercholesterolemia, unspecified: Secondary | ICD-10-CM

## 2023-07-04 DIAGNOSIS — K219 Gastro-esophageal reflux disease without esophagitis: Secondary | ICD-10-CM

## 2023-07-04 DIAGNOSIS — W19XXXS Unspecified fall, sequela: Secondary | ICD-10-CM

## 2023-07-04 DIAGNOSIS — E162 Hypoglycemia, unspecified: Secondary | ICD-10-CM

## 2023-07-04 DIAGNOSIS — E538 Deficiency of other specified B group vitamins: Secondary | ICD-10-CM | POA: Diagnosis not present

## 2023-07-04 DIAGNOSIS — G2581 Restless legs syndrome: Secondary | ICD-10-CM

## 2023-07-04 MED ORDER — ROSUVASTATIN CALCIUM 5 MG PO TABS: ORAL_TABLET | ORAL | 1 refills | Status: DC

## 2023-07-04 NOTE — Assessment & Plan Note (Addendum)
Followed by hematology.   Is followed by Dr Cathie Hoops for IDA.  Last evaluated 07/03/23 - recommended venofer and continue monthly B12 injections.

## 2023-07-04 NOTE — Telephone Encounter (Signed)
Pt called stating she wanted to give the cma the name of her meter which is rely on [employer]

## 2023-07-04 NOTE — Progress Notes (Signed)
Subjective:    Patient ID: Jocelyn Case, female    DOB: [birth year], 77 y.o.   MRN: [id number]  Patient here for  Chief Complaint  Patient presents with   Medical Management of Chronic Issues    HPI Here to follow up regarding IDA and GERD. Is followed by Dr Cathie Hoops for IDA.  Last evaluated 07/03/23 - recommended venofer and continue monthly B12 injections. Also seeing endocrinology.  Received evenity last - 06/28/23. Had f/u Dr Servando Snare 05/15/23 - on protonix for GERD.  Continuing aquatic therapy at the [facility] - chronic back pain. Takes tramadol prn (through Dr Yves Dill office).  Was evaluated in ER 02/28/23 - fall - tripped on the sidewalk. Injured right face/cheek.  Saw her dentist and an Transport planner.  Monitoring.  Better.  No residual headache. No dizziness. Stays active. No chest pain or sob reported.  No abdominal pain reported.  Monitors her dietary intake.  She is having issues with episodes she feels is her sugar dropping.  Has noticed more over the last 6 months.  May occur 2-3x/week.  Will start feeling different and will get something to eat or drink and the symptoms resolve.  Discussed low sugar and eating protein snacks, regular meals and not going long periods without eating.  She has a glucometer at home that belonged to her husband.  Will bring in and have [place] instruct her on now to check her blood sugar when these episodes are occurring - to help confirm diagnosis. She is also concerned regarding her memory.  Has a family history and would like formal neurological evaluation. Discussed labs.    Past Medical History:  Diagnosis Date   Anemia    Arthritis    Blood transfusion without reported diagnosis    Complication of anesthesia    CPAP (continuous positive airway pressure) dependence    GERD (gastroesophageal reflux disease)    Incontinence    Migraine    PONV (postoperative nausea and vomiting)    Restless leg syndrome 2015   R leg    Sleep apnea    UTI (urinary tract infection)     Past Surgical History:  Procedure Laterality Date   ABDOMINAL HYSTERECTOMY     APPENDECTOMY     AUGMENTATION MAMMAPLASTY Bilateral 1974   silicone removed 2014/ right side ruptured   BREAST EXCISIONAL BIOPSY Right    neg   CHOLECYSTECTOMY     COLONOSCOPY  2 years ago   COLONOSCOPY WITH PROPOFOL N/A 01/10/2022   Procedure: COLONOSCOPY WITH PROPOFOL;  Surgeon: Midge Minium, MD;  Location: [facility] ENDOSCOPY;  Service: Endoscopy;  Laterality: N/A;   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   not digesting good, had to have work on duodenal per pt   ESOPHAGOGASTRODUODENOSCOPY (EGD) WITH PROPOFOL N/A 02/14/2018   Procedure: ESOPHAGOGASTRODUODENOSCOPY (EGD) WITH PROPOFOL;  Surgeon: Pasty Spillers, MD;  Location: [facility] ENDOSCOPY;  Service: Endoscopy;  Laterality: N/A;   ESOPHAGOGASTRODUODENOSCOPY (EGD) WITH PROPOFOL N/A 02/15/2018   Procedure: ESOPHAGOGASTRODUODENOSCOPY (EGD) WITH PROPOFOL;  Surgeon: Pasty Spillers, MD;  Location: [facility] ENDOSCOPY;  Service: Endoscopy;  Laterality: N/A;   ESOPHAGOGASTRODUODENOSCOPY (EGD) WITH PROPOFOL N/A 07/05/2018   Procedure: ESOPHAGOGASTRODUODENOSCOPY (EGD) WITH PROPOFOL;  Surgeon: Pasty Spillers, MD;  Location: [facility] ENDOSCOPY;  Service: Endoscopy;  Laterality: N/A;   STOMACH SURGERY  2013   UPPER GASTROINTESTINAL ENDOSCOPY  05/2017   pt stated "bleeding ulcer and stopped up duodenum was noted"   Family History  Problem Relation Age of Onset   Peptic Ulcer  Disease Mother    Dementia Mother    COPD Father    Colon cancer Father    Breast cancer Neg Hx    Social History   Socioeconomic History   Marital status: Widowed    Spouse name: Not on file   Number of children: Not on file   Years of education: Not on file   Highest education level: Not on file  Occupational History   Not on file  Tobacco Use   Smoking status: Former    Current packs/day: 0.00    Types: Cigarettes    Quit date: 10/03/1974    Years since quitting: 48.7    Smokeless tobacco: Never  Vaping Use   Vaping status: Never Used  Substance and Sexual Activity   Alcohol use: Yes    Alcohol/week: 1.0 standard drink of alcohol    Types: 1 Glasses of wine per week    Comment: every night   Drug use: No   Sexual activity: Never  Other Topics Concern   Not on file  Social History Narrative   widow   Social Determinants of Health   Financial Resource Strain: Low Risk  (06/12/2023)   Overall Financial Resource Strain (CARDIA)    Difficulty of Paying Living Expenses: Not hard at all  Food Insecurity: No Food Insecurity (06/12/2023)   Hunger Vital Sign    Worried About Running Out of Food in the Last Year: Never true    Ran Out of Food in the Last Year: Never true  Transportation Needs: No Transportation Needs (06/12/2023)   PRAPARE - Administrator, Civil Service (Medical): No    Lack of Transportation (Non-Medical): No  Physical Activity: Inactive (06/12/2023)   Exercise Vital Sign    Days of Exercise per Week: 0 days    Minutes of Exercise per Session: 0 min  Stress: No Stress Concern Present (06/12/2023)   [employer] of Occupational Health - Occupational Stress Questionnaire    Feeling of Stress : Not at all  Social Connections: Moderately Isolated (06/12/2023)   Social Connection and Isolation Panel [NHANES]    Frequency of Communication with Friends and Family: More than three times a week    Frequency of Social Gatherings with Friends and Family: More than three times a week    Attends Religious Services: Never    Database administrator or Organizations: Yes    Attends Engineer, structural: More than 4 times per year    Marital Status: Widowed     Review of Systems  Constitutional:  Negative for appetite change and unexpected weight change.  HENT:  Negative for congestion and sinus pressure.   Respiratory:  Negative for cough, chest tightness and shortness of breath.   Cardiovascular:  Negative for chest pain  and palpitations.  Gastrointestinal:  Negative for abdominal pain, diarrhea, nausea and vomiting.  Genitourinary:  Negative for difficulty urinating and dysuria.  Musculoskeletal:  Negative for joint swelling and myalgias.  Skin:  Negative for color change and rash.  Neurological:  Negative for dizziness and headaches.  Psychiatric/Behavioral:  Negative for agitation and dysphoric mood.        Objective:     BP 128/70   Pulse 88   Temp 97.9 F (36.6 C)   Resp 16   Ht 5\' 2"  (1.575 m)   Wt 120 lb 6.4 oz (54.6 kg)   SpO2 98%   BMI 22.02 kg/m  Wt Readings from Last 3 Encounters:  07/04/23  120 lb 6.4 oz (54.6 kg)  07/03/23 120 lb 8 oz (54.7 kg)  06/12/23 116 lb (52.6 kg)    Physical Exam Vitals reviewed.  Constitutional:      General: She is not in acute distress.    Appearance: Normal appearance.  HENT:     Head: Normocephalic and atraumatic.     Comments: Minimal residual discomfort - cheek/jaw.      Right Ear: External ear normal.     Left Ear: External ear normal.  Eyes:     General: No scleral icterus.       Right eye: No discharge.        Left eye: No discharge.     Conjunctiva/sclera: Conjunctivae normal.  Neck:     Thyroid: No thyromegaly.  Cardiovascular:     Rate and Rhythm: Normal rate and regular rhythm.  Pulmonary:     Effort: No respiratory distress.     Breath sounds: Normal breath sounds. No wheezing.  Abdominal:     General: Bowel sounds are normal.     Palpations: Abdomen is soft.     Tenderness: There is no abdominal tenderness.  Musculoskeletal:        General: No swelling or tenderness.     Cervical back: Neck supple. No tenderness.  Lymphadenopathy:     Cervical: No cervical adenopathy.  Skin:    Findings: No erythema or rash.  Neurological:     Mental Status: She is alert.  Psychiatric:        Mood and Affect: Mood normal.        Behavior: Behavior normal.      Outpatient Encounter Medications as of 07/04/2023  Medication Sig    rosuvastatin (CRESTOR) 5 MG tablet Take one tablet q Monday, Wednesday and Friday.   acetaminophen (TYLENOL) 500 MG tablet Take 1 tablet by mouth in the morning and at bedtime.   Calcium-Vitamin D-Vitamin K (CALCIUM + D + K) 750-500-40 MG-UNT-MCG TABS Take by mouth in the morning and at bedtime.   Cholecalciferol (VITAMIN D3) 50 MCG (2000 UT) TABS Take 2,000 Units by mouth daily.   cyanocobalamin (,VITAMIN B-12,) 1000 MCG/ML injection Inject 1,000 mcg into the muscle every 30 (thirty) days.   Multiple Vitamin (MULTI-VITAMIN) tablet Take 1 tablet by mouth in the morning and at bedtime.   pantoprazole (PROTONIX) 40 MG tablet Take 1 tablet (40 mg total) by mouth daily.   Romosozumab-aqqg (EVENITY) 105 MG/1. SOSY injection Inject 210 mg into the skin every 30 (thirty) days.   traMADol (ULTRAM) 50 MG tablet Take 50 mg by mouth 3 (three) times daily as needed.   [DISCONTINUED] calcium carbonate (TUMS - DOSED IN MG ELEMENTAL CALCIUM) 500 MG chewable tablet Chew 1 tablet by mouth daily. (Patient not taking: Reported on 07/03/2023)   No facility-administered encounter medications on file as of 07/04/2023.     Lab Results  Component Value Date   WBC 3.6 (L) 06/29/2023   HGB 11.7 (L) 06/29/2023   HCT 37.5 06/29/2023   PLT 187 06/29/2023   GLUCOSE 95 06/29/2023   CHOL 173 06/29/2023   TRIG 107.0 06/29/2023   HDL 69.30 06/29/2023   LDLCALC 83 06/29/2023   ALT 36 (H) 06/29/2023   AST 32 06/29/2023   NA 141 06/29/2023   K 4.8 06/29/2023   CL 107 06/29/2023   CREATININE 0.84 06/29/2023   BUN 31 (H) 06/29/2023   CO2 25 06/29/2023   TSH 0.502 12/30/2020   HGBA1C 6.0 06/29/2023  MM 3D DIAGNOSTIC MAMMOGRAM BILATERAL BREAST  Result Date: 03/27/2023 CLINICAL DATA:  Status post RIGHT implant explantation. BI-RADS 3 follow-up of a RIGHT breast asymmetry, initiated June 2023. EXAM: DIGITAL DIAGNOSTIC BILATERAL MAMMOGRAM WITH TOMOSYNTHESIS TECHNIQUE: Bilateral digital diagnostic mammography and  breast tomosynthesis was performed. COMPARISON:  Previous exam(s). ACR Breast Density Category c: The breasts are heterogeneously dense, which may obscure small masses. FINDINGS: Diagnostic images of the RIGHT breast demonstrate mammographic stability of an asymmetry anterior to free silicone in the RIGHT inner breast. This is best appreciated on CC view. No new suspicious findings are noted. Sequela of free silicone are noted with similar postsurgical changes on MLO view as remote priors. No suspicious mass, distortion, or microcalcifications are identified to suggest presence of malignancy in the LEFT breast. IMPRESSION: 1. Stable probably benign RIGHT breast asymmetry best seen on CC view. Recommend follow-up diagnostic mammogram in 1 year. This will establish 2 years of definitive stability from tomosynthesis baseline exam. 2. No mammographic evidence of malignancy in the LEFT breast. RECOMMENDATION: Recommend bilateral diagnostic mammogram (with RIGHT and LEFT breast ultrasound if deemed necessary) in 1 year. Patient is due for contralateral screening at this point in time. I have discussed the findings and recommendations with the patient. If applicable, a reminder letter will be sent to the patient regarding the next appointment. BI-RADS CATEGORY  3: Probably benign. Electronically Signed   By: Meda Klinefelter M.D.   On: 03/27/2023 11:15      Assessment & Plan:  Iron deficiency anemia due to chronic blood loss Assessment & Plan: Followed by hematology.   Is followed by Dr Cathie Hoops for IDA.  Last evaluated 07/03/23 - recommended venofer and continue monthly B12 injections.   Hypercholesteremia Assessment & Plan: Low cholesterol diet and exercise.  Discussed recent labs and calculated cholesterol risk.  Agreeable to start low dose crestor 5mg  q day.  Follow lipid panel and liver function tests.   Orders: -     Hepatic function panel; Future  Family history of Alzheimer disease Assessment &  Plan: Concerned regarding her memory.  Family history of alzheimers.  Request referral to neurology for formal w/up.   Orders: -     Ambulatory referral to Neurology  B12 deficiency Assessment & Plan: Continue monthly B12 injections.    Gastroesophageal reflux disease, unspecified whether esophagitis present Assessment & Plan: Extensive GI issues/procedures, etc - as outlined previously. Has seen Dr Servando Snare.  On protonix.  Appears to be stable currently.  Follow.    OSA on CPAP Assessment & Plan: Continue cpap.  Follow.    RLS (restless legs syndrome) Assessment & Plan: Treating IDA.  Follow.    Hypoglycemia Assessment & Plan: Concern regarding episodes of low blood sugar.  Discussed.  Discussed eating protein and protein snacks.  Will bring in the glucometer and will teach her how to check her blood sugar.  Follow.  Call with update.    Fall, sequela Assessment & Plan: Recent fall as outlined.  Evaluation in ER reviewed.  Had f/u with her dentist and oral surgeon. Elected to monitor.  Follow.    Other orders -     Rosuvastatin Calcium; Take one tablet q Monday, Wednesday and Friday.  Dispense: 39 tablet; Refill: 1   I spent 45 minutes with the patient.  Time spent discussing her current concerns and symptoms. Specifically time spent discussing recent fall and residual issues as well as low sugar, etc.  Time also spent discussing further w/up, evaluation and treatment.  Dale , MD

## 2023-07-05 ENCOUNTER — Ambulatory Visit: Payer: Medicare Other

## 2023-07-05 DIAGNOSIS — E162 Hypoglycemia, unspecified: Secondary | ICD-10-CM

## 2023-07-05 NOTE — Telephone Encounter (Signed)
Called patient because per review it looks like her meter has been discontinued. Her husband had it and he passed away in 08-12-2014. She is going to [facility] to try to get one OTC and then come in for her NV this PM.

## 2023-07-05 NOTE — Progress Notes (Signed)
Pt presented today for glucometer teaching. Pt voiced and administrated understanding. Glucose today when pt administrated was 148.

## 2023-07-08 ENCOUNTER — Encounter: Payer: Self-pay | Admitting: Internal Medicine

## 2023-07-08 DIAGNOSIS — E162 Hypoglycemia, unspecified: Secondary | ICD-10-CM | POA: Insufficient documentation

## 2023-07-08 DIAGNOSIS — W19XXXA Unspecified fall, initial encounter: Secondary | ICD-10-CM | POA: Insufficient documentation

## 2023-07-08 NOTE — Assessment & Plan Note (Signed)
Treating IDA.  Follow.

## 2023-07-08 NOTE — Assessment & Plan Note (Signed)
Extensive GI issues/procedures, etc - as outlined previously. Has seen Dr Wohl.  On protonix.  Appears to be stable currently.  Follow.  

## 2023-07-08 NOTE — Assessment & Plan Note (Signed)
Concern regarding episodes of low blood sugar.  Discussed.  Discussed eating protein and protein snacks.  Will bring in the glucometer and will teach her how to check her blood sugar.  Follow.  Call with update.

## 2023-07-08 NOTE — Assessment & Plan Note (Signed)
Low cholesterol diet and exercise.  Discussed recent labs and calculated cholesterol risk.  Agreeable to start low dose crestor 5mg  q day.  Follow lipid panel and liver function tests.

## 2023-07-08 NOTE — Assessment & Plan Note (Signed)
Concerned regarding her memory.  Family history of alzheimers.  Request referral to neurology for formal w/up.

## 2023-07-08 NOTE — Assessment & Plan Note (Signed)
Recent fall as outlined.  Evaluation in ER reviewed.  Had f/u with her dentist and oral surgeon. Elected to monitor.  Follow.

## 2023-07-08 NOTE — Assessment & Plan Note (Signed)
Continue monthly B12 injections

## 2023-07-08 NOTE — Assessment & Plan Note (Signed)
Continue cpap.  Follow.  

## 2023-07-12 ENCOUNTER — Inpatient Hospital Stay: Payer: Medicare Other

## 2023-07-12 VITALS — BP 116/71 | HR 75 | Temp 96.3°F | Resp 18

## 2023-07-12 DIAGNOSIS — D500 Iron deficiency anemia secondary to blood loss (chronic): Secondary | ICD-10-CM

## 2023-07-12 MED ORDER — SODIUM CHLORIDE 0.9 % IV SOLN
INTRAVENOUS | Status: DC
  Filled 2023-07-12: qty 250

## 2023-07-12 MED ORDER — SODIUM CHLORIDE 0.9 % IV SOLN
200.0000 mg | Freq: Once | INTRAVENOUS | Status: AC
  Administered 2023-07-12: 200 mg via INTRAVENOUS
  Filled 2023-07-12: qty 200

## 2023-08-03 ENCOUNTER — Inpatient Hospital Stay: Payer: Medicare Other | Attending: Oncology

## 2023-08-03 DIAGNOSIS — E538 Deficiency of other specified B group vitamins: Secondary | ICD-10-CM | POA: Insufficient documentation

## 2023-08-03 DIAGNOSIS — D500 Iron deficiency anemia secondary to blood loss (chronic): Secondary | ICD-10-CM

## 2023-08-03 MED ORDER — CYANOCOBALAMIN 1000 MCG/ML IJ SOLN
1000.0000 ug | Freq: Once | INTRAMUSCULAR | Status: AC
  Administered 2023-08-03: 1000 ug via INTRAMUSCULAR
  Filled 2023-08-03: qty 1

## 2023-08-15 ENCOUNTER — Other Ambulatory Visit (INDEPENDENT_AMBULATORY_CARE_PROVIDER_SITE_OTHER): Payer: Medicare Other

## 2023-08-15 ENCOUNTER — Other Ambulatory Visit: Payer: Self-pay | Admitting: Internal Medicine

## 2023-08-15 DIAGNOSIS — M810 Age-related osteoporosis without current pathological fracture: Secondary | ICD-10-CM

## 2023-08-15 DIAGNOSIS — E7800 Pure hypercholesterolemia, unspecified: Secondary | ICD-10-CM

## 2023-08-15 DIAGNOSIS — R739 Hyperglycemia, unspecified: Secondary | ICD-10-CM

## 2023-08-15 DIAGNOSIS — E538 Deficiency of other specified B group vitamins: Secondary | ICD-10-CM

## 2023-08-15 LAB — HEPATIC FUNCTION PANEL
ALT: 37 U/L — ABNORMAL HIGH (ref 0–35)
AST: 32 U/L (ref 0–37)
Albumin: 3.8 g/dL (ref 3.5–5.2)
Alkaline Phosphatase: 115 U/L (ref 39–117)
Bilirubin, Direct: 0 mg/dL (ref 0.0–0.3)
Total Bilirubin: 0.2 mg/dL (ref 0.2–1.2)
Total Protein: 6.2 g/dL (ref 6.0–8.3)

## 2023-08-15 NOTE — Progress Notes (Signed)
Order placed for fasting labs.   

## 2023-08-16 ENCOUNTER — Telehealth: Payer: Self-pay

## 2023-08-16 ENCOUNTER — Encounter: Payer: Self-pay | Admitting: *Deleted

## 2023-08-16 NOTE — Telephone Encounter (Signed)
Pt called back and I read note and she verbalized understanding

## 2023-08-16 NOTE — Telephone Encounter (Signed)
-----   Message from [place] sent at 08/15/2023  9:10 PM EST ----- Notify Ms Pfuhl that one liver test remains just slightly elevated.  We will continue  to follow.  The remainder of the liver panel - wnl.  Will recheck with next fasting labs - scheduled.

## 2023-08-16 NOTE — Telephone Encounter (Signed)
Noted  

## 2023-09-03 ENCOUNTER — Inpatient Hospital Stay: Payer: Medicare Other | Attending: Oncology

## 2023-09-03 DIAGNOSIS — E538 Deficiency of other specified B group vitamins: Secondary | ICD-10-CM | POA: Diagnosis present

## 2023-09-03 DIAGNOSIS — D500 Iron deficiency anemia secondary to blood loss (chronic): Secondary | ICD-10-CM

## 2023-09-03 MED ORDER — CYANOCOBALAMIN 1000 MCG/ML IJ SOLN
1000.0000 ug | Freq: Once | INTRAMUSCULAR | Status: AC
  Administered 2023-09-03: 1000 ug via INTRAMUSCULAR
  Filled 2023-09-03: qty 1

## 2023-10-04 ENCOUNTER — Inpatient Hospital Stay: Payer: Medicare Other | Attending: Oncology

## 2023-10-04 ENCOUNTER — Inpatient Hospital Stay: Payer: Medicare Other

## 2023-10-04 DIAGNOSIS — D500 Iron deficiency anemia secondary to blood loss (chronic): Secondary | ICD-10-CM

## 2023-10-04 DIAGNOSIS — E538 Deficiency of other specified B group vitamins: Secondary | ICD-10-CM | POA: Diagnosis present

## 2023-10-04 MED ORDER — CYANOCOBALAMIN 1000 MCG/ML IJ SOLN
1000.0000 ug | Freq: Once | INTRAMUSCULAR | Status: AC
  Administered 2023-10-04: 1000 ug via INTRAMUSCULAR
  Filled 2023-10-04: qty 1

## 2023-10-22 ENCOUNTER — Other Ambulatory Visit: Payer: Self-pay | Admitting: Internal Medicine

## 2023-11-01 ENCOUNTER — Other Ambulatory Visit (INDEPENDENT_AMBULATORY_CARE_PROVIDER_SITE_OTHER): Payer: Medicare Other

## 2023-11-01 DIAGNOSIS — R739 Hyperglycemia, unspecified: Secondary | ICD-10-CM

## 2023-11-01 DIAGNOSIS — E7800 Pure hypercholesterolemia, unspecified: Secondary | ICD-10-CM | POA: Diagnosis not present

## 2023-11-01 LAB — LIPID PANEL
Cholesterol: 125 mg/dL (ref 0–200)
HDL: 60.9 mg/dL (ref >39.00)
LDL Cholesterol: 51 mg/dL (ref 0–99)
NonHDL: 64.29
Total CHOL/HDL Ratio: 2
Triglycerides: 67 mg/dL (ref 0.0–149.0)
VLDL: 13.4 mg/dL (ref 0.0–40.0)

## 2023-11-01 LAB — BASIC METABOLIC PANEL
BUN: 24 mg/dL — ABNORMAL HIGH (ref 6–23)
CO2: 26 meq/L (ref 19–32)
Calcium: 9.1 mg/dL (ref 8.4–10.5)
Chloride: 107 meq/L (ref 96–112)
Creatinine, Ser: 0.82 mg/dL (ref 0.40–1.20)
GFR: 67.77 mL/min (ref >60.00)
Glucose, Bld: 94 mg/dL (ref 70–99)
Potassium: 4.9 meq/L (ref 3.5–5.1)
Sodium: 142 meq/L (ref 135–145)

## 2023-11-01 LAB — HEPATIC FUNCTION PANEL
ALT: 24 U/L (ref 0–35)
AST: 25 U/L (ref 0–37)
Albumin: 4.1 g/dL (ref 3.5–5.2)
Alkaline Phosphatase: 85 U/L (ref 39–117)
Bilirubin, Direct: 0.1 mg/dL (ref 0.0–0.3)
Total Bilirubin: 0.3 mg/dL (ref 0.2–1.2)
Total Protein: 6.3 g/dL (ref 6.0–8.3)

## 2023-11-01 LAB — TSH: TSH: 0.67 u[IU]/mL (ref 0.35–5.50)

## 2023-11-01 LAB — HEMOGLOBIN A1C: Hgb A1c MFr Bld: 6.1 % (ref 4.6–6.5)

## 2023-11-05 ENCOUNTER — Inpatient Hospital Stay: Payer: Medicare Other | Attending: Oncology

## 2023-11-05 ENCOUNTER — Encounter: Payer: Self-pay | Admitting: Internal Medicine

## 2023-11-05 ENCOUNTER — Ambulatory Visit (INDEPENDENT_AMBULATORY_CARE_PROVIDER_SITE_OTHER): Payer: Medicare Other | Admitting: Internal Medicine

## 2023-11-05 ENCOUNTER — Inpatient Hospital Stay: Payer: Medicare Other

## 2023-11-05 VITALS — BP 118/68 | HR 88 | Temp 98.2°F | Resp 16 | Ht 62.0 in | Wt 120.4 lb

## 2023-11-05 DIAGNOSIS — K219 Gastro-esophageal reflux disease without esophagitis: Secondary | ICD-10-CM

## 2023-11-05 DIAGNOSIS — E538 Deficiency of other specified B group vitamins: Secondary | ICD-10-CM | POA: Diagnosis present

## 2023-11-05 DIAGNOSIS — G4733 Obstructive sleep apnea (adult) (pediatric): Secondary | ICD-10-CM

## 2023-11-05 DIAGNOSIS — D500 Iron deficiency anemia secondary to blood loss (chronic): Secondary | ICD-10-CM | POA: Diagnosis not present

## 2023-11-05 DIAGNOSIS — Z0000 Encounter for general adult medical examination without abnormal findings: Secondary | ICD-10-CM

## 2023-11-05 DIAGNOSIS — E7800 Pure hypercholesterolemia, unspecified: Secondary | ICD-10-CM | POA: Diagnosis not present

## 2023-11-05 DIAGNOSIS — F439 Reaction to severe stress, unspecified: Secondary | ICD-10-CM | POA: Insufficient documentation

## 2023-11-05 DIAGNOSIS — M810 Age-related osteoporosis without current pathological fracture: Secondary | ICD-10-CM

## 2023-11-05 MED ORDER — CYANOCOBALAMIN 1000 MCG/ML IJ SOLN
1000.0000 ug | Freq: Once | INTRAMUSCULAR | Status: AC
  Administered 2023-11-05: 1000 ug via INTRAMUSCULAR
  Filled 2023-11-05: qty 1

## 2023-11-05 NOTE — Assessment & Plan Note (Signed)
Physical today 11/05/23.  Bilateral diagnostic 03/27/23 - Birads III. Recommended f/u bilateral diagnostic mammogram in 03/2024.Marland Kitchen  Colonoscopy 01/10/22 - normal.

## 2023-11-05 NOTE — Progress Notes (Signed)
Subjective:    Patient ID: Jocelyn Case, female    DOB: [birth year], 78 y.o.   MRN: [id number]   HPI With past history of osteoporosis and GERD, comes in today to follow up on these issues as well as for a complete physical exam.  Saw endocrinology 10/16/23 - f/u osteoporosis. Continues on evenity. Once completed her 12 month course of evenity, will plan to stat reclast. DEXA due 07/2024. Is followed by Dr Cathie Hoops for IDA. Last evaluated 07/03/23 - recommended venofer and continue monthly B12 injections. Had f/u Dr Servando Snare 05/15/23 - on protonix for GERD. Continuing aquatic therapy at the [facility] - chronic back pain. Takes tramadol prn (through Dr Yves Dill office).  Increased stress with her dog's health issues. Discussed. Overall appears to be handling things relatively well. No chest pain or sob reported. No abdominal pain reported. Bowels stable. Discussed labs.    Past Medical History:  Diagnosis Date   Anemia    Arthritis    Blood transfusion without reported diagnosis    Complication of anesthesia    CPAP (continuous positive airway pressure) dependence    GERD (gastroesophageal reflux disease)    Incontinence    Migraine    PONV (postoperative nausea and vomiting)    Restless leg syndrome 2015   R leg    Sleep apnea    UTI (urinary tract infection)    Past Surgical History:  Procedure Laterality Date   ABDOMINAL HYSTERECTOMY     APPENDECTOMY     AUGMENTATION MAMMAPLASTY Bilateral 1974   silicone removed 2014/ right side ruptured   BREAST EXCISIONAL BIOPSY Right    neg   CHOLECYSTECTOMY     COLONOSCOPY  2 years ago   COLONOSCOPY WITH PROPOFOL N/A 01/10/2022   Procedure: COLONOSCOPY WITH PROPOFOL;  Surgeon: Midge Minium, MD;  Location: [facility] ENDOSCOPY;  Service: Endoscopy;  Laterality: N/A;   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   not digesting good, had to have work on duodenal per pt   ESOPHAGOGASTRODUODENOSCOPY (EGD) WITH PROPOFOL N/A 02/14/2018   Procedure: ESOPHAGOGASTRODUODENOSCOPY  (EGD) WITH PROPOFOL;  Surgeon: Pasty Spillers, MD;  Location: [facility] ENDOSCOPY;  Service: Endoscopy;  Laterality: N/A;   ESOPHAGOGASTRODUODENOSCOPY (EGD) WITH PROPOFOL N/A 02/15/2018   Procedure: ESOPHAGOGASTRODUODENOSCOPY (EGD) WITH PROPOFOL;  Surgeon: Pasty Spillers, MD;  Location: [facility];  Service: Endoscopy;  Laterality: N/A;   ESOPHAGOGASTRODUODENOSCOPY (EGD) WITH PROPOFOL N/A 07/05/2018   Procedure: ESOPHAGOGASTRODUODENOSCOPY (EGD) WITH PROPOFOL;  Surgeon: Pasty Spillers, MD;  Location: [facility] ENDOSCOPY;  Service: Endoscopy;  Laterality: N/A;   STOMACH SURGERY  2013   UPPER GASTROINTESTINAL ENDOSCOPY  05/2017   pt stated "bleeding ulcer and stopped up duodenum was noted"   Family History  Problem Relation Age of Onset   Peptic Ulcer Disease Mother    Dementia Mother    COPD Father    Colon cancer Father    Breast cancer Neg Hx    Social History   Socioeconomic History   Marital status: Widowed    Spouse name: Not on file   Number of children: Not on file   Years of education: Not on file   Highest education level: Not on file  Occupational History   Not on file  Tobacco Use   Smoking status: Former    Current packs/day: 0.00    Types: Cigarettes    Quit date: 10/03/1974    Years since quitting: 49.1   Smokeless tobacco: Never  Vaping Use   Vaping status: Never Used  Substance  and Sexual Activity   Alcohol use: Yes    Alcohol/week: 1.0 standard drink of alcohol    Types: 1 Glasses of wine per week    Comment: every night   Drug use: No   Sexual activity: Never  Other Topics Concern   Not on file  Social History Narrative   widow   Social Drivers of Health   Financial Resource Strain: Low Risk  (06/12/2023)   Overall Financial Resource Strain (CARDIA)    Difficulty of Paying Living Expenses: Not hard at all  Food Insecurity: No Food Insecurity (06/12/2023)   Hunger Vital Sign    Worried About Running Out of Food in the Last Year: Never true     Ran Out of Food in the Last Year: Never true  Transportation Needs: No Transportation Needs (06/12/2023)   PRAPARE - Administrator, Civil Service (Medical): No    Lack of Transportation (Non-Medical): No  Physical Activity: Inactive (06/12/2023)   Exercise Vital Sign    Days of Exercise per Week: 0 days    Minutes of Exercise per Session: 0 min  Stress: No Stress Concern Present (06/12/2023)   [employer] of Occupational Health - Occupational Stress Questionnaire    Feeling of Stress : Not at all  Social Connections: Moderately Isolated (06/12/2023)   Social Connection and Isolation Panel [NHANES]    Frequency of Communication with Friends and Family: More than three times a week    Frequency of Social Gatherings with Friends and Family: More than three times a week    Attends Religious Services: Never    Database administrator or Organizations: Yes    Attends Engineer, structural: More than 4 times per year    Marital Status: Widowed     Review of Systems  Constitutional:  Negative for appetite change and unexpected weight change.  HENT:  Negative for congestion, sinus pressure and sore throat.   Eyes:  Negative for pain and visual disturbance.  Respiratory:  Negative for cough, chest tightness and shortness of breath.   Cardiovascular:  Negative for chest pain, palpitations and leg swelling.  Gastrointestinal:  Negative for abdominal pain, nausea and vomiting.  Genitourinary:  Negative for difficulty urinating and dysuria.  Musculoskeletal:  Negative for joint swelling and myalgias.  Skin:  Negative for color change and rash.  Neurological:  Negative for dizziness and headaches.  Hematological:  Negative for adenopathy. Does not bruise/bleed easily.  Psychiatric/Behavioral:  Negative for agitation and dysphoric mood.        Objective:     BP 118/68   Pulse 88   Temp 98.2 F (36.8 C)   Resp 16   Ht 5\' 2"  (1.575 m)   Wt 120 lb 6.4 oz (54.6  kg)   SpO2 98%   BMI 22.02 kg/m  Wt Readings from Last 3 Encounters:  11/05/23 120 lb 6.4 oz (54.6 kg)  07/04/23 120 lb 6.4 oz (54.6 kg)  07/03/23 120 lb 8 oz (54.7 kg)    Physical Exam Vitals reviewed.  Constitutional:      General: She is not in acute distress.    Appearance: Normal appearance. She is well-developed.  HENT:     Head: Normocephalic and atraumatic.     Right Ear: External ear normal.     Left Ear: External ear normal.     Mouth/Throat:     Pharynx: No oropharyngeal exudate or posterior oropharyngeal erythema.  Eyes:     General: No  scleral icterus.       Right eye: No discharge.        Left eye: No discharge.     Conjunctiva/sclera: Conjunctivae normal.  Neck:     Thyroid: No thyromegaly.  Cardiovascular:     Rate and Rhythm: Normal rate and regular rhythm.  Pulmonary:     Effort: No tachypnea, accessory muscle usage or respiratory distress.     Breath sounds: Normal breath sounds. No decreased breath sounds or wheezing.  Chest:  Breasts:    Right: No inverted nipple, mass, nipple discharge or tenderness (no axillary adenopathy).     Left: No inverted nipple, mass, nipple discharge or tenderness (no axilarry adenopathy).  Abdominal:     General: Bowel sounds are normal.     Palpations: Abdomen is soft.     Tenderness: There is no abdominal tenderness.  Musculoskeletal:        General: No swelling or tenderness.     Cervical back: Neck supple.  Lymphadenopathy:     Cervical: No cervical adenopathy.  Skin:    Findings: No erythema or rash.  Neurological:     Mental Status: She is alert and oriented to person, place, and time.  Psychiatric:        Mood and Affect: Mood normal.        Behavior: Behavior normal.         Outpatient Encounter Medications as of 11/05/2023  Medication Sig   fluorouracil (EFUDEX) 5 % cream Apply topically 2 (two) times daily.   acetaminophen (TYLENOL) 500 MG tablet Take 1 tablet by mouth in the morning and at  bedtime.   Calcium-Vitamin D-Vitamin K (CALCIUM + D + K) 750-500-40 MG-UNT-MCG TABS Take by mouth in the morning and at bedtime.   Cholecalciferol (VITAMIN D3) 50 MCG (2000 UT) TABS Take 2,000 Units by mouth daily.   cyanocobalamin (,VITAMIN B-12,) 1000 MCG/ML injection Inject 1,000 mcg into the muscle every 30 (thirty) days.   Multiple Vitamin (MULTI-VITAMIN) tablet Take 1 tablet by mouth in the morning and at bedtime.   pantoprazole (PROTONIX) 40 MG tablet Take 1 tablet (40 mg total) by mouth daily.   Romosozumab-aqqg (EVENITY) 105 MG/1. SOSY injection Inject 210 mg into the skin every 30 (thirty) days.   rosuvastatin (CRESTOR) 5 MG tablet TAKE ONE TABLET EVERY MONDAY, WEDNESDAY AND FRIDAY.   traMADol (ULTRAM) 50 MG tablet Take 50 mg by mouth 3 (three) times daily as needed.   No facility-administered encounter medications on file as of 11/05/2023.     Lab Results  Component Value Date   WBC 3.6 (L) 06/29/2023   HGB 11.7 (L) 06/29/2023   HCT 37.5 06/29/2023   PLT 187 06/29/2023   GLUCOSE 94 11/01/2023   CHOL 125 11/01/2023   TRIG 67.0 11/01/2023   HDL 60.90 11/01/2023   LDLCALC 51 11/01/2023   ALT 24 11/01/2023   AST 25 11/01/2023   NA 142 11/01/2023   K 4.9 11/01/2023   CL 107 11/01/2023   CREATININE 0.82 11/01/2023   BUN 24 (H) 11/01/2023   CO2 26 11/01/2023   TSH 0.67 11/01/2023   HGBA1C 6.1 11/01/2023    MM 3D DIAGNOSTIC MAMMOGRAM BILATERAL BREAST Result Date: 03/27/2023 CLINICAL DATA:  Status post RIGHT implant explantation. BI-RADS 3 follow-up of a RIGHT breast asymmetry, initiated June 2023. EXAM: DIGITAL DIAGNOSTIC BILATERAL MAMMOGRAM WITH TOMOSYNTHESIS TECHNIQUE: Bilateral digital diagnostic mammography and breast tomosynthesis was performed. COMPARISON:  Previous exam(s). ACR Breast Density Category c: The breasts are heterogeneously  dense, which may obscure small masses. FINDINGS: Diagnostic images of the RIGHT breast demonstrate mammographic stability of an  asymmetry anterior to free silicone in the RIGHT inner breast. This is best appreciated on CC view. No new suspicious findings are noted. Sequela of free silicone are noted with similar postsurgical changes on MLO view as remote priors. No suspicious mass, distortion, or microcalcifications are identified to suggest presence of malignancy in the LEFT breast. IMPRESSION: 1. Stable probably benign RIGHT breast asymmetry best seen on CC view. Recommend follow-up diagnostic mammogram in 1 year. This will establish 2 years of definitive stability from tomosynthesis baseline exam. 2. No mammographic evidence of malignancy in the LEFT breast. RECOMMENDATION: Recommend bilateral diagnostic mammogram (with RIGHT and LEFT breast ultrasound if deemed necessary) in 1 year. Patient is due for contralateral screening at this point in time. I have discussed the findings and recommendations with the patient. If applicable, a reminder letter will be sent to the patient regarding the next appointment. BI-RADS CATEGORY  3: Probably benign. Electronically Signed   By: Meda Klinefelter M.D.   On: 03/27/2023 11:15      Assessment & Plan:  Healthcare maintenance Assessment & Plan: Physical today 11/05/23.  Bilateral diagnostic 03/27/23 - Birads III. Recommended f/u bilateral diagnostic mammogram in 03/2024.Marland Kitchen  Colonoscopy 01/10/22 - normal.    B12 deficiency Assessment & Plan: Continue monthly B12 injections.    Gastroesophageal reflux disease, unspecified whether esophagitis present Assessment & Plan: Extensive GI issues/procedures, etc - as outlined previously. Has seen Dr Servando Snare.  On protonix.  Appears to be stable currently.  Follow.    Hypercholesteremia Assessment & Plan: Low cholesterol diet and exercise.  Continue crestor 5mg  q day.  Follow lipid panel and liver function tests.    Iron deficiency anemia due to chronic blood loss Assessment & Plan: Followed by hematology.   Is followed by Dr Cathie Hoops for IDA.  Last  evaluated 07/03/23 - recommended venofer and continue monthly B12 injections.   OSA on CPAP Assessment & Plan: Continue cpap.  Follow.    Osteoporosis, unspecified osteoporosis type, unspecified pathological fracture presence Assessment & Plan:  Saw endocrinology 10/16/23 - f/u osteoporosis. Continues on evenity. Once completed her 12 month course of evenity, will plan to stat reclast. DEXA due 07/2024.    Stress Assessment & Plan: Increased stress with her dog's health. Appears to be handling things well. Follow.       Dale Carrollton, MD

## 2023-11-05 NOTE — Assessment & Plan Note (Signed)
Continue monthly B12 injections

## 2023-11-05 NOTE — Assessment & Plan Note (Signed)
Saw endocrinology 10/16/23 - f/u osteoporosis. Continues on evenity. Once completed her 12 month course of evenity, will plan to stat reclast. DEXA due 07/2024.

## 2023-11-05 NOTE — Assessment & Plan Note (Signed)
Followed by hematology.   Is followed by Dr Cathie Hoops for IDA.  Last evaluated 07/03/23 - recommended venofer and continue monthly B12 injections.

## 2023-11-05 NOTE — Assessment & Plan Note (Signed)
Extensive GI issues/procedures, etc - as outlined previously. Has seen Dr Wohl.  On protonix.  Appears to be stable currently.  Follow.  

## 2023-11-05 NOTE — Assessment & Plan Note (Signed)
Low cholesterol diet and exercise.  Continue crestor 5mg  q day.  Follow lipid panel and liver function tests.

## 2023-11-05 NOTE — Assessment & Plan Note (Signed)
Increased stress with her dog's health. Appears to be handling things well. Follow.

## 2023-11-05 NOTE — Assessment & Plan Note (Signed)
Continue cpap.  Follow.  

## 2023-12-03 ENCOUNTER — Inpatient Hospital Stay: Payer: Medicare Other | Attending: Oncology

## 2023-12-03 ENCOUNTER — Inpatient Hospital Stay: Payer: Medicare Other

## 2023-12-03 DIAGNOSIS — E538 Deficiency of other specified B group vitamins: Secondary | ICD-10-CM | POA: Insufficient documentation

## 2023-12-03 DIAGNOSIS — D500 Iron deficiency anemia secondary to blood loss (chronic): Secondary | ICD-10-CM

## 2023-12-03 MED ORDER — CYANOCOBALAMIN 1000 MCG/ML IJ SOLN
1000.0000 ug | Freq: Once | INTRAMUSCULAR | Status: AC
  Administered 2023-12-03: 1000 ug via INTRAMUSCULAR
  Filled 2023-12-03: qty 1

## 2023-12-25 ENCOUNTER — Other Ambulatory Visit: Payer: Self-pay | Admitting: Neurology

## 2023-12-25 DIAGNOSIS — R413 Other amnesia: Secondary | ICD-10-CM

## 2024-01-01 ENCOUNTER — Other Ambulatory Visit: Payer: Medicare Other

## 2024-01-01 ENCOUNTER — Inpatient Hospital Stay: Payer: Medicare Other | Attending: Oncology

## 2024-01-01 DIAGNOSIS — Z79899 Other long term (current) drug therapy: Secondary | ICD-10-CM | POA: Insufficient documentation

## 2024-01-01 DIAGNOSIS — D500 Iron deficiency anemia secondary to blood loss (chronic): Secondary | ICD-10-CM | POA: Insufficient documentation

## 2024-01-01 DIAGNOSIS — E538 Deficiency of other specified B group vitamins: Secondary | ICD-10-CM | POA: Diagnosis present

## 2024-01-01 LAB — CBC WITH DIFFERENTIAL (CANCER CENTER ONLY)
Abs Immature Granulocytes: 0.01 10*3/uL (ref 0.00–0.07)
Basophils Absolute: 0 10*3/uL (ref 0.0–0.1)
Basophils Relative: 1 %
Eosinophils Absolute: 0.2 10*3/uL (ref 0.0–0.5)
Eosinophils Relative: 4 %
HCT: 36.9 % (ref 36.0–46.0)
Hemoglobin: 11.4 g/dL — ABNORMAL LOW (ref 12.0–15.0)
Immature Granulocytes: 0 %
Lymphocytes Relative: 25 %
Lymphs Abs: 1.1 10*3/uL (ref 0.7–4.0)
MCH: 32.1 pg (ref 26.0–34.0)
MCHC: 30.9 g/dL (ref 30.0–36.0)
MCV: 103.9 fL — ABNORMAL HIGH (ref 80.0–100.0)
Monocytes Absolute: 0.4 10*3/uL (ref 0.1–1.0)
Monocytes Relative: 9 %
Neutro Abs: 2.7 10*3/uL (ref 1.7–7.7)
Neutrophils Relative %: 61 %
Platelet Count: 200 10*3/uL (ref 150–400)
RBC: 3.55 MIL/uL — ABNORMAL LOW (ref 3.87–5.11)
RDW: 13.2 % (ref 11.5–15.5)
WBC Count: 4.4 10*3/uL (ref 4.0–10.5)
nRBC: 0 % (ref 0.0–0.2)

## 2024-01-01 LAB — IRON AND TIBC
Iron: 73 ug/dL (ref 28–170)
Saturation Ratios: 20 % (ref 10.4–31.8)
TIBC: 357 ug/dL (ref 250–450)
UIBC: 284 ug/dL

## 2024-01-01 LAB — VITAMIN B12: Vitamin B-12: 571 pg/mL (ref 180–914)

## 2024-01-01 LAB — FERRITIN: Ferritin: 161 ng/mL (ref 11–307)

## 2024-01-03 ENCOUNTER — Ambulatory Visit: Admission: RE | Admit: 2024-01-03 | Source: Ambulatory Visit

## 2024-01-08 ENCOUNTER — Inpatient Hospital Stay (HOSPITAL_BASED_OUTPATIENT_CLINIC_OR_DEPARTMENT_OTHER): Payer: Medicare Other | Admitting: Oncology

## 2024-01-08 ENCOUNTER — Encounter: Payer: Self-pay | Admitting: Oncology

## 2024-01-08 ENCOUNTER — Inpatient Hospital Stay: Payer: Medicare Other

## 2024-01-08 VITALS — BP 118/67 | HR 79 | Temp 97.8°F | Resp 18 | Wt 121.6 lb

## 2024-01-08 VITALS — BP 117/68 | HR 76

## 2024-01-08 DIAGNOSIS — D500 Iron deficiency anemia secondary to blood loss (chronic): Secondary | ICD-10-CM

## 2024-01-08 DIAGNOSIS — E538 Deficiency of other specified B group vitamins: Secondary | ICD-10-CM

## 2024-01-08 DIAGNOSIS — D7589 Other specified diseases of blood and blood-forming organs: Secondary | ICD-10-CM | POA: Diagnosis not present

## 2024-01-08 MED ORDER — CYANOCOBALAMIN 1000 MCG/ML IJ SOLN
1000.0000 ug | Freq: Once | INTRAMUSCULAR | Status: AC
  Administered 2024-01-08: 1000 ug via INTRAMUSCULAR

## 2024-01-08 MED ORDER — SODIUM CHLORIDE 0.9% FLUSH
10.0000 mL | INTRAVENOUS | Status: DC | PRN
  Administered 2024-01-08: 10 mL
  Filled 2024-01-08: qty 10

## 2024-01-08 MED ORDER — IRON SUCROSE 20 MG/ML IV SOLN
200.0000 mg | Freq: Once | INTRAVENOUS | Status: AC
  Administered 2024-01-08: 200 mg via INTRAVENOUS

## 2024-01-08 NOTE — Progress Notes (Signed)
 Hematology/Oncology Progress note [phone] [phone]     Chief Complaint: Jocelyn Case is a 78 y.o. female presents for follow up of B12 deficiency and iron deficiency, s/p partial gastrectomy in 2009  ASSESSMENT & PLAN:   Iron deficiency anemia due to chronic blood loss Labs reviewed and discussed with patient. Lab Results  Component Value Date   HGB 11.4 (L) 01/01/2024   TIBC 357 01/01/2024   IRONPCTSAT 20 01/01/2024   FERRITIN 161 01/01/2024     Recommend Venofer weekly x 1 for maintenance given her history of gastrectomy.  B12 deficiency B12 level is stable  Continue monthly vitamin B12 injection  Macrocytosis Can not rule out MDS MCV is stable.  Shared decision was made to continue to monitor counts.   Orders Placed This Encounter  Procedures   CBC with Differential (Cancer Center Only)    Standing Status:   Future    Expected Date:   07/09/2024    Expiration Date:   01/07/2025   Iron and TIBC    Standing Status:   Future    Expected Date:   07/09/2024    Expiration Date:   01/07/2025   Ferritin    Standing Status:   Future    Expected Date:   07/09/2024    Expiration Date:   01/07/2025   Retic Panel    Standing Status:   Future    Expected Date:   07/09/2024    Expiration Date:   01/07/2025   Vitamin B12    Standing Status:   Future    Expected Date:   07/09/2024    Expiration Date:   01/07/2025   Follow-up plan B12 monthly x 5 Lab in 6 months, iron labs, B12, folate, cmp prior to MD + B12 inj/ Venofer.   All questions were answered. The patient knows to call the clinic with any problems, questions or concerns.  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 On EGD she also has an additional small bowel anastomosis. 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  Her symptoms were worsening since removal of the stent and had daily emesis and decreased oral intake. 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  INTERVAL HISTORY Adisyn Ruscitti is a 78 y.o. female who has above history reviewed by me today presents for follow up visit for management of iron deficiency anemia, B12 deficiency. Patient reports fatigue is slightly better  Otherwise no new complaints.  Patient has been on monthly vitamin B12 injection.  Tolerates well. Denies hematochezia, hematuria, hematemesis, epistaxis, black tarry stool or easy bruising.    Past Medical History:  Diagnosis Date   Anemia    Arthritis    Blood transfusion without reported diagnosis    Complication of anesthesia    CPAP (continuous positive airway pressure) dependence    GERD (gastroesophageal reflux disease)    Incontinence    Migraine    PONV (postoperative nausea and vomiting)    Restless leg syndrome 2015   R leg    Sleep apnea    UTI (urinary tract infection)  Past Surgical History:  Procedure Laterality Date   ABDOMINAL HYSTERECTOMY     APPENDECTOMY     AUGMENTATION MAMMAPLASTY Bilateral 1974   silicone removed 2014/ right side ruptured   BREAST EXCISIONAL BIOPSY Right    neg   CHOLECYSTECTOMY     COLONOSCOPY  2 years ago   COLONOSCOPY WITH PROPOFOL N/A 01/10/2022   Procedure: COLONOSCOPY WITH PROPOFOL;  Surgeon: Midge Minium, MD;  Location: [facility];  Service: Endoscopy;  Laterality: N/A;   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   not digesting good, had to have work on duodenal per pt   ESOPHAGOGASTRODUODENOSCOPY (EGD) WITH PROPOFOL N/A  02/14/2018   Procedure: ESOPHAGOGASTRODUODENOSCOPY (EGD) WITH PROPOFOL;  Surgeon: Pasty Spillers, MD;  Location: [facility];  Service: Endoscopy;  Laterality: N/A;   ESOPHAGOGASTRODUODENOSCOPY (EGD) WITH PROPOFOL N/A 02/15/2018   Procedure: ESOPHAGOGASTRODUODENOSCOPY (EGD) WITH PROPOFOL;  Surgeon: Pasty Spillers, MD;  Location: [facility];  Service: Endoscopy;  Laterality: N/A;   ESOPHAGOGASTRODUODENOSCOPY (EGD) WITH PROPOFOL N/A 07/05/2018   Procedure: ESOPHAGOGASTRODUODENOSCOPY (EGD) WITH PROPOFOL;  Surgeon: Pasty Spillers, MD;  Location: [facility];  Service: Endoscopy;  Laterality: N/A;   STOMACH SURGERY  2013   UPPER GASTROINTESTINAL ENDOSCOPY  05/2017   pt stated "bleeding ulcer and stopped up duodenum was noted"    Family History  Problem Relation Age of Onset   Peptic Ulcer Disease Mother    Dementia Mother    COPD Father    Colon cancer Father    Breast cancer Neg Hx     Social History:  reports that she quit smoking about 49 years ago. Her smoking use included cigarettes. She has never used smokeless tobacco. She reports current alcohol use of about 1.0 standard drink of alcohol per week. She reports that she does not use drugs.   Allergies:  Allergies  Allergen Reactions   Solifenacin Rash   Morphine Nausea And Vomiting    Current Medications: Current Outpatient Medications  Medication Sig Dispense Refill   acetaminophen (TYLENOL) 500 MG tablet Take 1 tablet by mouth in the morning and at bedtime.     amoxicillin (AMOXIL) 500 MG capsule Take by mouth.     Calcium-Vitamin D-Vitamin K (CALCIUM + D + K) 750-500-40 MG-UNT-MCG TABS Take by mouth in the morning and at bedtime.     Cholecalciferol (VITAMIN D3) 50 MCG (2000 UT) TABS Take 2,000 Units by mouth daily.     cyanocobalamin (,VITAMIN B-12,) 1000 MCG/ML injection Inject 1,000 mcg into the muscle every 30 (thirty) days.     fluorouracil (EFUDEX) 5 % cream Apply topically 2 (two) times  daily.     Multiple Vitamin (MULTI-VITAMIN) tablet Take 1 tablet by mouth in the morning and at bedtime.     pantoprazole (PROTONIX) 40 MG tablet Take 1 tablet (40 mg total) by mouth daily. 90 tablet 3   Romosozumab-aqqg (EVENITY) 105 MG/1. SOSY injection Inject 210 mg into the skin every 30 (thirty) days.     rosuvastatin (CRESTOR) 5 MG tablet TAKE ONE TABLET EVERY MONDAY, WEDNESDAY AND FRIDAY. 39 tablet 1   traMADol (ULTRAM) 50 MG tablet Take 50 mg by mouth 3 (three) times daily as needed.     No current facility-administered medications for this visit.   Facility-Administered Medications Ordered in Other Visits  Medication Dose Route Frequency Provider Last Rate Last Admin   sodium chloride flush (NS) 0.9 % injection 10 mL  10 mL Intracatheter PRN Rickard Patience, MD   10 mL at 01/08/24 1030  Performance status (ECOG): 0-1  Vitals Blood pressure 118/67, pulse 79, temperature 97.8 F (36.6 C), temperature source Tympanic, resp. rate 18, weight 121 lb 9.6 oz (55.2 kg), SpO2 97%.   Physical Exam Constitutional:      General: She is not in acute distress.    Appearance: She is not diaphoretic.  HENT:     Head: Normocephalic and atraumatic.     Nose: Nose normal.     Mouth/Throat:     Pharynx: No oropharyngeal exudate.  Eyes:     General: No scleral icterus.    Pupils: Pupils are equal, round, and reactive to light.  Cardiovascular:     Rate and Rhythm: Normal rate and regular rhythm.     Heart sounds: No murmur heard. Pulmonary:     Effort: Pulmonary effort is normal. No respiratory distress.     Breath sounds: No rales.  Chest:     Chest wall: No tenderness.  Abdominal:     General: There is no distension.     Palpations: Abdomen is soft.     Tenderness: There is no abdominal tenderness.  Musculoskeletal:        General: Normal range of motion.     Cervical back: Normal range of motion and neck supple.  Skin:    General: Skin is warm and dry.     Findings: No  erythema.  Neurological:     Mental Status: She is alert and oriented to person, place, and time.     Cranial Nerves: No cranial nerve deficit.     Motor: No abnormal muscle tone.     Coordination: Coordination normal.  Psychiatric:        Mood and Affect: Mood and affect normal.     Labs    Latest Ref Rng & Units 01/01/2024   12:59 PM 06/29/2023    9:53 AM 12/19/2022    1:58 PM  CBC  WBC 4.0 - 10.5 K/uL 4.4  3.6  5.9   Hemoglobin 12.0 - 15.0 g/dL 16.1  09.6  04.5   Hematocrit 36.0 - 46.0 % 36.9  37.5  36.8   Platelets 150 - 400 K/uL 200  187  217       Latest Ref Rng & Units 11/01/2023    8:39 AM 08/15/2023    9:59 AM 06/29/2023    9:22 AM  CMP  Glucose 70 - 99 mg/dL 94   95   BUN 6 - 23 mg/dL 24   31   Creatinine 4.09 - 1.20 mg/dL 8.11   9.14   Sodium 782 - 145 mEq/L 142   141   Potassium 3.5 - 5.1 mEq/L 4.9   4.8   Chloride 96 - 112 mEq/L 107   107   CO2 19 - 32 mEq/L 26   25   Calcium 8.4 - 10.5 mg/dL 9.1   9.0   Total Protein 6.0 - 8.3 g/dL 6.3  6.2  6.5   Total Bilirubin 0.2 - 1.2 mg/dL 0.3  0.2  0.3   Alkaline Phos 39 - 117 U/L 85  115  98   AST 0 - 37 U/L 25  32  32   ALT 0 - 35 U/L 24  37  36

## 2024-01-08 NOTE — Assessment & Plan Note (Addendum)
 B12 level is stable  Continue monthly vitamin B12 injection

## 2024-01-08 NOTE — Assessment & Plan Note (Addendum)
 Labs reviewed and discussed with patient. Lab Results  Component Value Date   HGB 11.4 (L) 01/01/2024   TIBC 357 01/01/2024   IRONPCTSAT 20 01/01/2024   FERRITIN 161 01/01/2024     Recommend Venofer weekly x 1 for maintenance given her history of gastrectomy.

## 2024-01-08 NOTE — Assessment & Plan Note (Addendum)
 Can not rule out MDS MCV is stable.  Shared decision was made to continue to monitor counts.

## 2024-02-07 ENCOUNTER — Inpatient Hospital Stay: Attending: Oncology

## 2024-02-07 DIAGNOSIS — E538 Deficiency of other specified B group vitamins: Secondary | ICD-10-CM | POA: Insufficient documentation

## 2024-02-07 DIAGNOSIS — D500 Iron deficiency anemia secondary to blood loss (chronic): Secondary | ICD-10-CM

## 2024-02-07 MED ORDER — CYANOCOBALAMIN 1000 MCG/ML IJ SOLN
1000.0000 ug | Freq: Once | INTRAMUSCULAR | Status: AC
  Administered 2024-02-07: 1000 ug via INTRAMUSCULAR
  Filled 2024-02-07: qty 1

## 2024-03-03 ENCOUNTER — Other Ambulatory Visit: Payer: Self-pay

## 2024-03-03 DIAGNOSIS — R739 Hyperglycemia, unspecified: Secondary | ICD-10-CM

## 2024-03-03 DIAGNOSIS — E7800 Pure hypercholesterolemia, unspecified: Secondary | ICD-10-CM

## 2024-03-04 ENCOUNTER — Other Ambulatory Visit: Payer: Self-pay | Admitting: *Deleted

## 2024-03-04 ENCOUNTER — Ambulatory Visit: Payer: Self-pay | Admitting: Internal Medicine

## 2024-03-04 ENCOUNTER — Other Ambulatory Visit (INDEPENDENT_AMBULATORY_CARE_PROVIDER_SITE_OTHER): Payer: Medicare Other

## 2024-03-04 DIAGNOSIS — R739 Hyperglycemia, unspecified: Secondary | ICD-10-CM

## 2024-03-04 DIAGNOSIS — E7800 Pure hypercholesterolemia, unspecified: Secondary | ICD-10-CM | POA: Diagnosis not present

## 2024-03-04 DIAGNOSIS — D500 Iron deficiency anemia secondary to blood loss (chronic): Secondary | ICD-10-CM

## 2024-03-04 LAB — LIPID PANEL
Cholesterol: 141 mg/dL (ref 0–200)
HDL: 63.8 mg/dL (ref >39.00)
LDL Cholesterol: 63 mg/dL (ref 0–99)
NonHDL: 77.68
Total CHOL/HDL Ratio: 2
Triglycerides: 75 mg/dL (ref 0.0–149.0)
VLDL: 15 mg/dL (ref 0.0–40.0)

## 2024-03-04 LAB — BASIC METABOLIC PANEL WITH GFR
BUN: 34 mg/dL — ABNORMAL HIGH (ref 6–23)
CO2: 27 meq/L (ref 19–32)
Calcium: 9.2 mg/dL (ref 8.4–10.5)
Chloride: 108 meq/L (ref 96–112)
Creatinine, Ser: 0.91 mg/dL (ref 0.40–1.20)
GFR: 59.66 mL/min — ABNORMAL LOW (ref >60.00)
Glucose, Bld: 93 mg/dL (ref 70–99)
Potassium: 5.2 meq/L — ABNORMAL HIGH (ref 3.5–5.1)
Sodium: 141 meq/L (ref 135–145)

## 2024-03-04 LAB — HEPATIC FUNCTION PANEL
ALT: 41 U/L — ABNORMAL HIGH (ref 0–35)
AST: 39 U/L — ABNORMAL HIGH (ref 0–37)
Albumin: 4.1 g/dL (ref 3.5–5.2)
Alkaline Phosphatase: 77 U/L (ref 39–117)
Bilirubin, Direct: 0.1 mg/dL (ref 0.0–0.3)
Total Bilirubin: 0.3 mg/dL (ref 0.2–1.2)
Total Protein: 6.2 g/dL (ref 6.0–8.3)

## 2024-03-04 LAB — HEMOGLOBIN A1C: Hgb A1c MFr Bld: 5.8 % (ref 4.6–6.5)

## 2024-03-06 ENCOUNTER — Ambulatory Visit: Payer: Medicare Other | Admitting: Internal Medicine

## 2024-03-06 ENCOUNTER — Encounter: Payer: Self-pay | Admitting: Internal Medicine

## 2024-03-06 VITALS — BP 112/68 | HR 82 | Temp 97.9°F | Resp 16 | Ht 62.0 in | Wt 119.8 lb

## 2024-03-06 DIAGNOSIS — R7989 Other specified abnormal findings of blood chemistry: Secondary | ICD-10-CM | POA: Insufficient documentation

## 2024-03-06 DIAGNOSIS — G4733 Obstructive sleep apnea (adult) (pediatric): Secondary | ICD-10-CM

## 2024-03-06 DIAGNOSIS — E7800 Pure hypercholesterolemia, unspecified: Secondary | ICD-10-CM

## 2024-03-06 DIAGNOSIS — Z1231 Encounter for screening mammogram for malignant neoplasm of breast: Secondary | ICD-10-CM

## 2024-03-06 DIAGNOSIS — R944 Abnormal results of kidney function studies: Secondary | ICD-10-CM | POA: Diagnosis not present

## 2024-03-06 DIAGNOSIS — R928 Other abnormal and inconclusive findings on diagnostic imaging of breast: Secondary | ICD-10-CM

## 2024-03-06 DIAGNOSIS — R739 Hyperglycemia, unspecified: Secondary | ICD-10-CM

## 2024-03-06 DIAGNOSIS — D500 Iron deficiency anemia secondary to blood loss (chronic): Secondary | ICD-10-CM

## 2024-03-06 DIAGNOSIS — K219 Gastro-esophageal reflux disease without esophagitis: Secondary | ICD-10-CM

## 2024-03-06 DIAGNOSIS — M810 Age-related osteoporosis without current pathological fracture: Secondary | ICD-10-CM

## 2024-03-06 DIAGNOSIS — M545 Low back pain, unspecified: Secondary | ICD-10-CM

## 2024-03-06 LAB — BASIC METABOLIC PANEL WITH GFR
BUN: 28 mg/dL — ABNORMAL HIGH (ref 6–23)
CO2: 25 meq/L (ref 19–32)
Calcium: 9 mg/dL (ref 8.4–10.5)
Chloride: 107 meq/L (ref 96–112)
Creatinine, Ser: 0.83 mg/dL (ref 0.40–1.20)
GFR: 66.63 mL/min (ref >60.00)
Glucose, Bld: 58 mg/dL — ABNORMAL LOW (ref 70–99)
Potassium: 4.7 meq/L (ref 3.5–5.1)
Sodium: 140 meq/L (ref 135–145)

## 2024-03-06 NOTE — Progress Notes (Signed)
 Subjective:    Patient ID: Jocelyn Case, female    DOB: 08/28/1946, 78 y.o.   MRN: [id number]  Patient here for  Chief Complaint  Patient presents with   Medical Management of Chronic Issues    HPI Here for a scheduled follow up - follow up regarding GERD, anemia and osteoporosis. Seeing endocrinology. Continues evenity. Will plan to start reclast after complete 12 month course of evenity. Dexa due 07/2024. Followed by Dr Wilhelmenia Harada for IDA. Last seen 01/08/24 - recommended venofer  and continue monthly B12 injection.  Saw neurology 12/20/23 - MRI - neuroquant. She feels she is doing relatively well. No chest pain or sob reported. No abdominal pain or bowel change reported.   Past Medical History:  Diagnosis Date   Anemia    Arthritis    Blood transfusion without reported diagnosis    Complication of anesthesia    CPAP (continuous positive airway pressure) dependence    GERD (gastroesophageal reflux disease)    Incontinence    Migraine    PONV (postoperative nausea and vomiting)    Restless leg syndrome 2015   R leg    Sleep apnea    UTI (urinary tract infection)    Past Surgical History:  Procedure Laterality Date   ABDOMINAL HYSTERECTOMY     APPENDECTOMY     AUGMENTATION MAMMAPLASTY Bilateral 1974   silicone removed 2014/ right side ruptured   BREAST EXCISIONAL BIOPSY Right    neg   CHOLECYSTECTOMY     COLONOSCOPY  2 years ago   COLONOSCOPY WITH PROPOFOL  N/A 01/10/2022   Procedure: COLONOSCOPY WITH PROPOFOL ;  Surgeon: Marnee Sink, MD;  Location: [facility];  Service: Endoscopy;  Laterality: N/A;   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   not digesting good, had to have work on duodenal per pt   ESOPHAGOGASTRODUODENOSCOPY (EGD) WITH PROPOFOL  N/A 02/14/2018   Procedure: ESOPHAGOGASTRODUODENOSCOPY (EGD) WITH PROPOFOL ;  Surgeon: Irby Mannan, MD;  Location: [facility];  Service: Endoscopy;  Laterality: N/A;   ESOPHAGOGASTRODUODENOSCOPY (EGD) WITH PROPOFOL  N/A 02/15/2018    Procedure: ESOPHAGOGASTRODUODENOSCOPY (EGD) WITH PROPOFOL ;  Surgeon: Irby Mannan, MD;  Location: [facility];  Service: Endoscopy;  Laterality: N/A;   ESOPHAGOGASTRODUODENOSCOPY (EGD) WITH PROPOFOL  N/A 07/05/2018   Procedure: ESOPHAGOGASTRODUODENOSCOPY (EGD) WITH PROPOFOL ;  Surgeon: Irby Mannan, MD;  Location: [facility] END[facility];  Service: Endoscopy;  Laterality: N/A;   STOMACH SURGERY  2013   UPPER GASTROINTESTINAL ENDOSCOPY  05/2017   pt stated "bleeding ulcer and stopped up duodenum was noted"   Family History  Problem Relation Age of Onset   Peptic Ulcer Disease Mother    Dementia Mother    COPD Father    Colon cancer Father    Breast cancer Neg Hx    Social History   Socioeconomic History   Marital status: Widowed    Spouse name: Not on file   Number of children: Not on file   Years of education: Not on file   Highest education level: Not on file  Occupational History   Not on file  Tobacco Use   Smoking status: Former    Current packs/day: 0.00    Types: Cigarettes    Quit date: 10/03/1974    Years since quitting: 49.4   Smokeless tobacco: Never  Vaping Use   Vaping status: Never Used  Substance and Sexual Activity   Alcohol use: Yes    Alcohol/week: 1.0 standard drink of alcohol    Types: 1 Glasses of wine per week  Comment: every night   Drug use: No   Sexual activity: Never  Other Topics Concern   Not on file  Social History Narrative   widow   Social Drivers of Health   Financial Resource Strain: Low Risk  (12/20/2023)   Received from [facility]   Overall Financial Resource Strain (CARDIA)    Difficulty of Paying Living Expenses: Not hard at all  Food Insecurity: No Food Insecurity (12/20/2023)   Received from [facility]   Hunger Vital Sign    Worried About Running Out of Food in the Last Year: Never true    Ran Out of Food in the Last Year: Never true  Transportation Needs: No Transportation  Needs (12/20/2023)   Received from [facility] - Transportation    In the past 12 months, has lack of transportation kept you from medical appointments or from getting medications?: No    Lack of Transportation (Non-Medical): No  Physical Activity: Inactive (06/12/2023)   Exercise Vital Sign    Days of Exercise per Week: 0 days    Minutes of Exercise per Session: 0 min  Stress: No Stress Concern Present (06/12/2023)   [employer] of Occupational Health - Occupational Stress Questionnaire    Feeling of Stress : Not at all  Social Connections: Moderately Isolated (06/12/2023)   Social Connection and Isolation Panel [NHANES]    Frequency of Communication with Friends and Family: More than three times a week    Frequency of Social Gatherings with Friends and Family: More than three times a week    Attends Religious Services: Never    Database administrator or Organizations: Yes    Attends Engineer, structural: More than 4 times per year    Marital Status: Widowed     Review of Systems  Constitutional:  Negative for appetite change and unexpected weight change.  HENT:  Negative for congestion and sinus pressure.   Respiratory:  Negative for cough, chest tightness and shortness of breath.   Cardiovascular:  Negative for chest pain, palpitations and leg swelling.  Gastrointestinal:  Negative for abdominal pain, diarrhea, nausea and vomiting.  Genitourinary:  Negative for difficulty urinating and dysuria.  Musculoskeletal:  Negative for joint swelling and myalgias.  Skin:  Negative for color change and rash.  Neurological:  Negative for dizziness and headaches.  Psychiatric/Behavioral:  Negative for agitation and dysphoric mood.        Objective:     BP 112/68   Pulse 82   Temp 97.9 F (36.6 C)   Resp 16   Ht 5\' 2"  (1.575 m)   Wt 119 lb 12.8 oz (54.3 kg)   SpO2 98%   BMI 21.91 kg/m  Wt Readings from Last 3 Encounters:  03/06/24 119 lb  12.8 oz (54.3 kg)  01/08/24 121 lb 9.6 oz (55.2 kg)  11/05/23 120 lb 6.4 oz (54.6 kg)    Physical Exam Vitals reviewed.  Constitutional:      General: She is not in acute distress.    Appearance: Normal appearance.  HENT:     Head: Normocephalic and atraumatic.     Right Ear: External ear normal.     Left Ear: External ear normal.     Mouth/Throat:     Pharynx: No oropharyngeal exudate or posterior oropharyngeal erythema.  Eyes:     General: No scleral icterus.       Right eye: No discharge.  Left eye: No discharge.     Conjunctiva/sclera: Conjunctivae normal.  Neck:     Thyroid : No thyromegaly.  Cardiovascular:     Rate and Rhythm: Normal rate and regular rhythm.  Pulmonary:     Effort: No respiratory distress.     Breath sounds: Normal breath sounds. No wheezing.  Abdominal:     General: Bowel sounds are normal.     Palpations: Abdomen is soft.     Tenderness: There is no abdominal tenderness.  Musculoskeletal:        General: No swelling or tenderness.     Cervical back: Neck supple. No tenderness.  Lymphadenopathy:     Cervical: No cervical adenopathy.  Skin:    Findings: No erythema or rash.  Neurological:     Mental Status: She is alert.  Psychiatric:        Mood and Affect: Mood normal.        Behavior: Behavior normal.         Outpatient Encounter Medications as of 03/06/2024  Medication Sig   acetaminophen (TYLENOL) 500 MG tablet Take 1 tablet by mouth in the morning and at bedtime.   Calcium -Vitamin D -Vitamin K (CALCIUM  + D + K) 750-500-40 MG-UNT-MCG TABS Take by mouth daily.   Cholecalciferol (VITAMIN D3) 50 MCG (2000 UT) TABS Take 2,000 Units by mouth daily.   cyanocobalamin  (,VITAMIN B-12,) 1000 MCG/ML injection Inject 1,000 mcg into the muscle every 30 (thirty) days.   Multiple Vitamin (MULTI-VITAMIN) tablet Take 1 tablet by mouth in the morning and at bedtime.   pantoprazole  (PROTONIX ) 40 MG tablet Take 1 tablet (40 mg total) by mouth daily.    Romosozumab-aqqg (EVENITY) 105 MG/1. SOSY injection Inject 210 mg into the skin every 30 (thirty) days.   rosuvastatin  (CRESTOR ) 5 MG tablet TAKE ONE TABLET EVERY MONDAY, WEDNESDAY AND FRIDAY.   traMADol (ULTRAM) 50 MG tablet Take 50 mg by mouth 3 (three) times daily as needed.   [DISCONTINUED] amoxicillin (AMOXIL) 500 MG capsule Take by mouth.   [DISCONTINUED] fluorouracil (EFUDEX) 5 % cream Apply topically 2 (two) times daily.   No facility-administered encounter medications on file as of 03/06/2024.     Lab Results  Component Value Date   WBC 4.4 01/01/2024   HGB 11.4 (L) 01/01/2024   HCT 36.9 01/01/2024   PLT 200 01/01/2024   GLUCOSE 58 (L) 03/06/2024   CHOL 141 03/04/2024   TRIG 75.0 03/04/2024   HDL 63.80 03/04/2024   LDLCALC 63 03/04/2024   ALT 41 (H) 03/04/2024   AST 39 (H) 03/04/2024   NA 140 03/06/2024   K 4.7 03/06/2024   CL 107 03/06/2024   CREATININE 0.83 03/06/2024   BUN 28 (H) 03/06/2024   CO2 25 03/06/2024   TSH 0.67 11/01/2023   HGBA1C 5.8 03/04/2024    MM 3D DIAGNOSTIC MAMMOGRAM BILATERAL BREAST Result Date: 03/27/2023 CLINICAL DATA:  Status post RIGHT implant explantation. BI-RADS 3 follow-up of a RIGHT breast asymmetry, initiated June 2023. EXAM: DIGITAL DIAGNOSTIC BILATERAL MAMMOGRAM WITH TOMOSYNTHESIS TECHNIQUE: Bilateral digital diagnostic mammography and breast tomosynthesis was performed. COMPARISON:  Previous exam(s). ACR Breast Density Category c: The breasts are heterogeneously dense, which may obscure small masses. FINDINGS: Diagnostic images of the RIGHT breast demonstrate mammographic stability of an asymmetry anterior to free silicone in the RIGHT inner breast. This is best appreciated on CC view. No new suspicious findings are noted. Sequela of free silicone are noted with similar postsurgical changes on MLO view as remote priors. No suspicious  mass, distortion, or microcalcifications are identified to suggest presence of malignancy in the LEFT  breast. IMPRESSION: 1. Stable probably benign RIGHT breast asymmetry best seen on CC view. Recommend follow-up diagnostic mammogram in 1 year. This will establish 2 years of definitive stability from tomosynthesis baseline exam. 2. No mammographic evidence of malignancy in the LEFT breast. RECOMMENDATION: Recommend bilateral diagnostic mammogram (with RIGHT and LEFT breast ultrasound if deemed necessary) in 1 year. Patient is due for contralateral screening at this point in time. I have discussed the findings and recommendations with the patient. If applicable, a reminder letter will be sent to the patient regarding the next appointment. BI-RADS CATEGORY  3: Probably benign. Electronically Signed   By: Clancy Crimes M.D.   On: 03/27/2023 11:15      Assessment & Plan:  Visit for screening mammogram  Hypercholesteremia Assessment & Plan: Continue crestor . Low cholesterol diet and exercise. Follow lipid panel.  Lab Results  Component Value Date   CHOL 141 03/04/2024   HDL 63.80 03/04/2024   LDLCALC 63 03/04/2024   TRIG 75.0 03/04/2024   CHOLHDL 2 03/04/2024     Orders: -     Lipid panel; Future -     Hepatic function panel; Future -     Basic metabolic panel with GFR; Future  Hyperglycemia -     Hemoglobin A1c; Future  Abnormal mammogram -     MM 3D DIAGNOSTIC MAMMOGRAM BILATERAL BREAST; Future  Decreased GFR Assessment & Plan: Avoid antiinflammatory medication. Stay hydrated. Check f/u metabolic panel.   Orders: -     Basic metabolic panel with GFR  Abnormal liver function test Assessment & Plan: AST and ALT slightly increased. No acute symptoms. Recheck liver panel. Further w/up pending results.   Orders: -     Hepatic function panel; Future  Gastroesophageal reflux disease, unspecified whether esophagitis present Assessment & Plan: Extensive GI issues/procedures, etc - as outlined previously. Has seen Dr Ole Berkeley.  On protonix .  Stable. Follow.    Iron  deficiency  anemia due to chronic blood loss Assessment & Plan: Followed by hematology.   Is followed by Dr Wilhelmenia Harada for IDA.   Last seen 01/08/24 - recommended venofer  and continue monthly B12 injection.    Midline low back pain without sciatica, unspecified chronicity Assessment & Plan:   Has seen Dr Erman Hayward.  S/p trigger point injection 05/02/22 and ESI 05/25/22 - right low back/buttock pain. Had f/u 06/21/22 -  doing better.  Recommended continuing tramadol.   OSA on CPAP Assessment & Plan: Continue cpap.    Osteoporosis, unspecified osteoporosis type, unspecified pathological fracture presence Assessment & Plan:  Saw endocrinology 10/16/23 - f/u osteoporosis. Continues on evenity. Once completed her 12 month course of evenity, will plan to stat reclast. DEXA due 07/2024.       Dellar Fenton, MD

## 2024-03-07 ENCOUNTER — Ambulatory Visit: Payer: Self-pay | Admitting: Internal Medicine

## 2024-03-09 ENCOUNTER — Encounter: Payer: Self-pay | Admitting: Internal Medicine

## 2024-03-09 NOTE — Assessment & Plan Note (Signed)
 Avoid antiinflammatory medication. Stay hydrated. Check f/u metabolic panel.

## 2024-03-09 NOTE — Assessment & Plan Note (Signed)
 Continue crestor . Low cholesterol diet and exercise. Follow lipid panel.  Lab Results  Component Value Date   CHOL 141 03/04/2024   HDL 63.80 03/04/2024   LDLCALC 63 03/04/2024   TRIG 75.0 03/04/2024   CHOLHDL 2 03/04/2024

## 2024-03-09 NOTE — Assessment & Plan Note (Signed)
 Extensive GI issues/procedures, etc - as outlined previously. Has seen Dr Ole Berkeley.  On protonix .  Stable. Follow.

## 2024-03-09 NOTE — Assessment & Plan Note (Signed)
 AST and ALT slightly increased. No acute symptoms. Recheck liver panel. Further w/up pending results.

## 2024-03-09 NOTE — Assessment & Plan Note (Signed)
 Continue cpap.

## 2024-03-09 NOTE — Assessment & Plan Note (Signed)
 Saw endocrinology 10/16/23 - f/u osteoporosis. Continues on evenity. Once completed her 12 month course of evenity, will plan to stat reclast. DEXA due 07/2024.

## 2024-03-09 NOTE — Assessment & Plan Note (Signed)
 Followed by hematology.   Is followed by Dr Wilhelmenia Harada for IDA.   Last seen 01/08/24 - recommended venofer  and continue monthly B12 injection.

## 2024-03-09 NOTE — Assessment & Plan Note (Signed)
Has seen Dr Sharlet Salina.  S/p trigger point injection 05/02/22 and ESI 05/25/22 - right low back/buttock pain. Had f/u 06/21/22 -  doing better.  Recommended continuing tramadol.

## 2024-03-10 ENCOUNTER — Inpatient Hospital Stay: Attending: Oncology

## 2024-03-10 DIAGNOSIS — E538 Deficiency of other specified B group vitamins: Secondary | ICD-10-CM | POA: Diagnosis present

## 2024-03-10 DIAGNOSIS — D500 Iron deficiency anemia secondary to blood loss (chronic): Secondary | ICD-10-CM

## 2024-03-10 MED ORDER — CYANOCOBALAMIN 1000 MCG/ML IJ SOLN
1000.0000 ug | Freq: Once | INTRAMUSCULAR | Status: AC
  Administered 2024-03-10: 1000 ug via INTRAMUSCULAR
  Filled 2024-03-10: qty 1

## 2024-03-22 IMAGING — MG MM DIGITAL SCREENING BILAT W/ TOMO AND CAD
6 of 12 series · 6 of 36 positions shown · non-contrast
Comparison: None Available.

CLINICAL DATA: Screening.

EXAM:
DIGITAL SCREENING BILATERAL MAMMOGRAM WITH TOMOSYNTHESIS AND CAD
TECHNIQUE: Bilateral screening digital craniocaudal and mediolateral oblique
mammograms were obtained. Bilateral screening digital breast
tomosynthesis was performed. The images were evaluated with
computer-aided detection.

[R MLO synth-2D (1 of 2)]
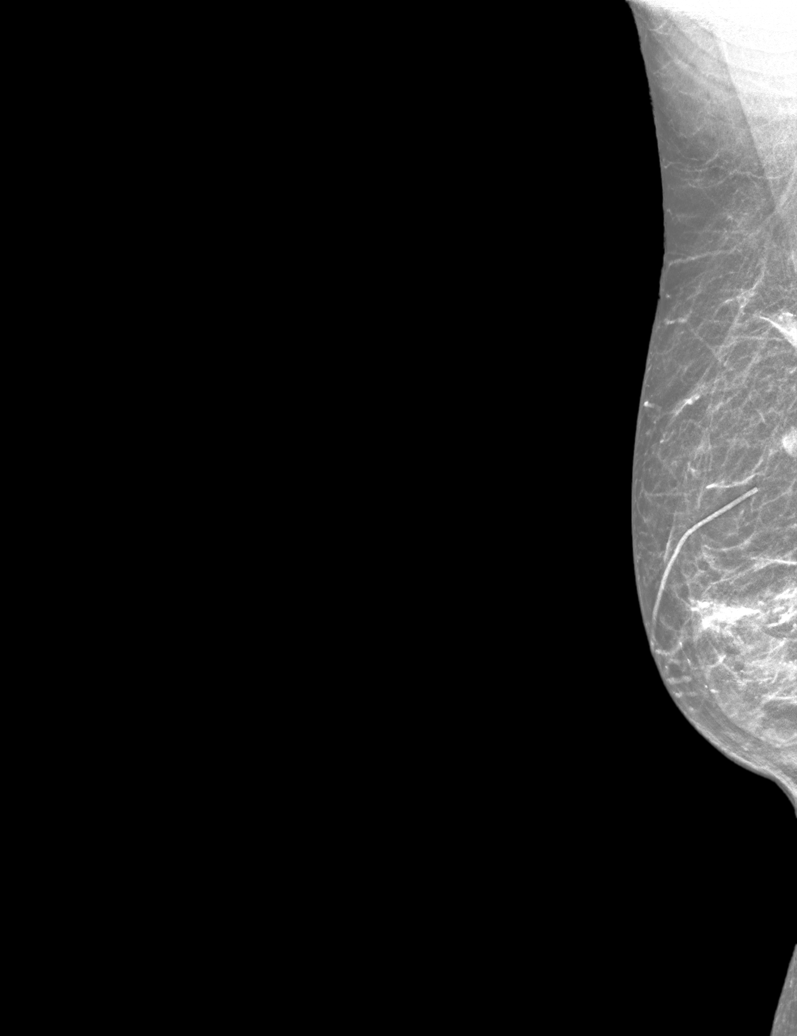

[L CC synth-2D]
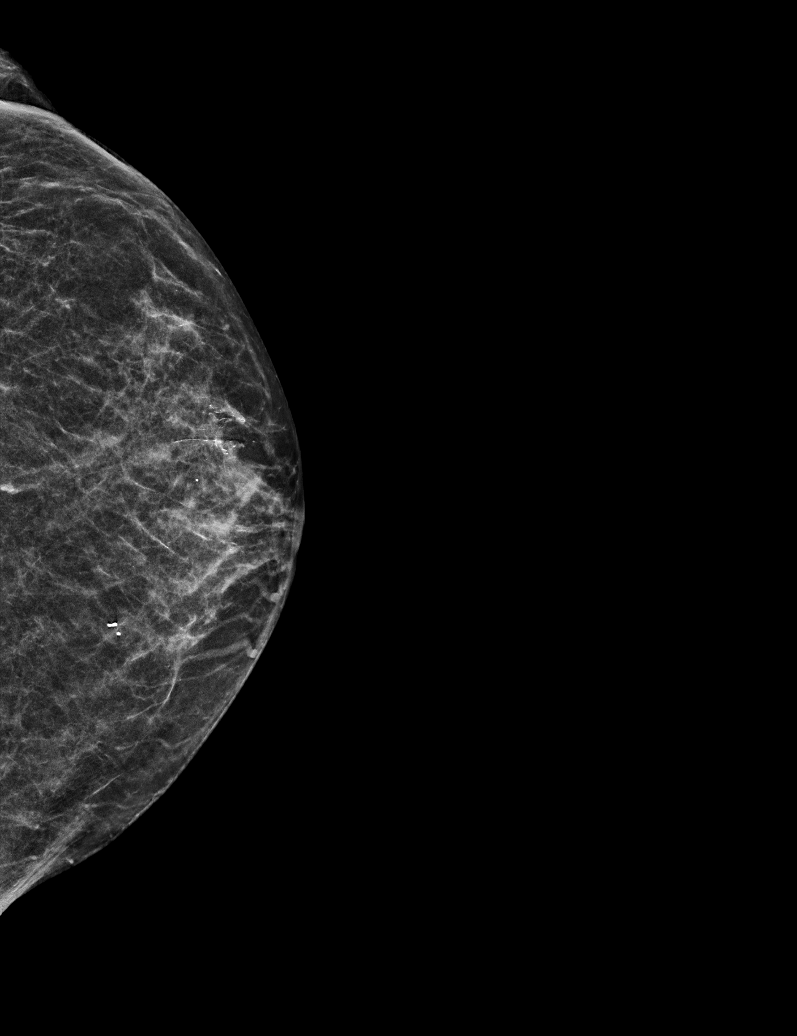

[R CC synth-2D]
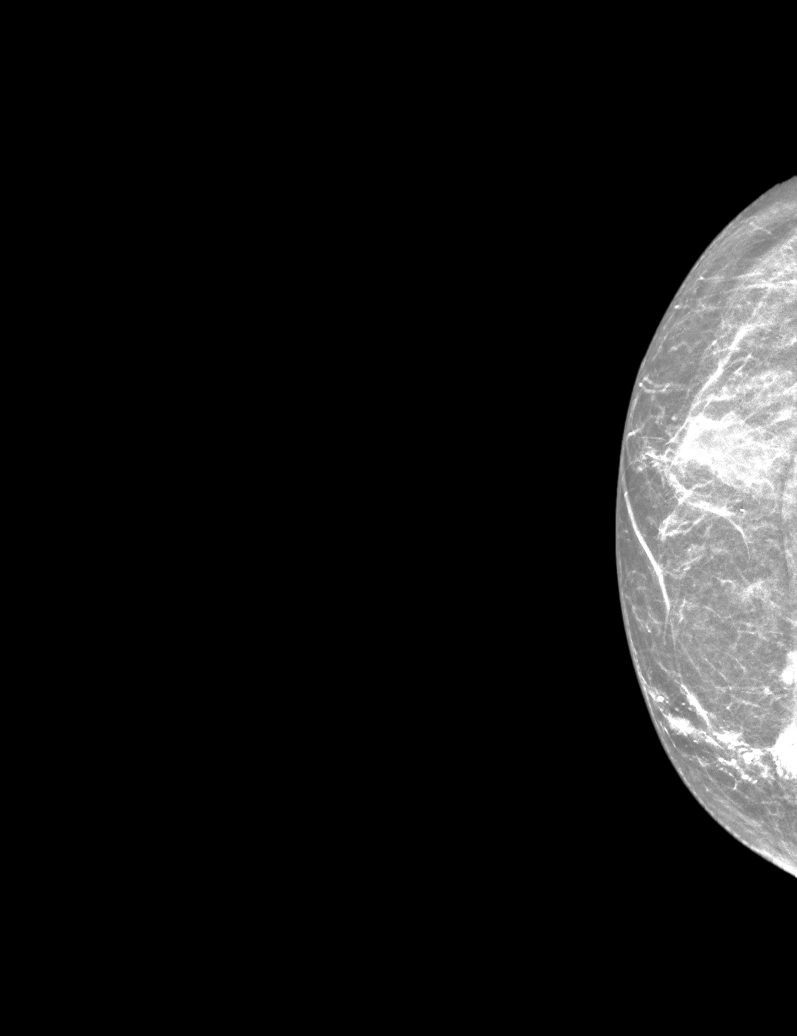

[L MLO synth-2D]
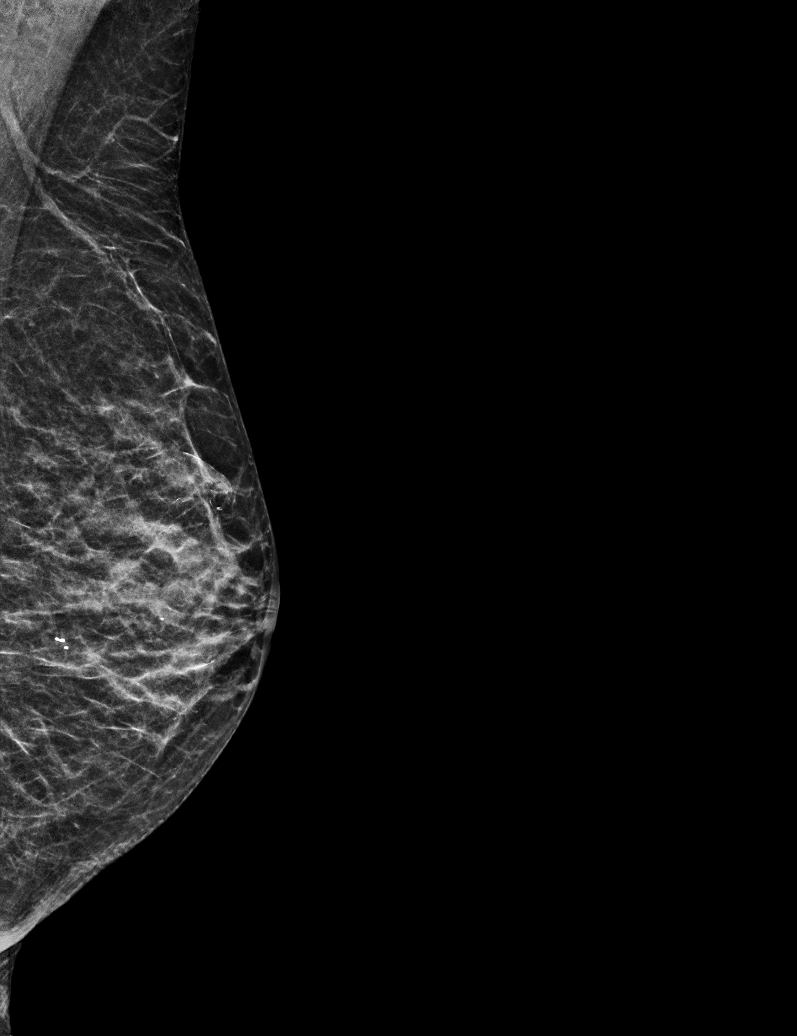

[R MLO synth-2D (2 of 2)]
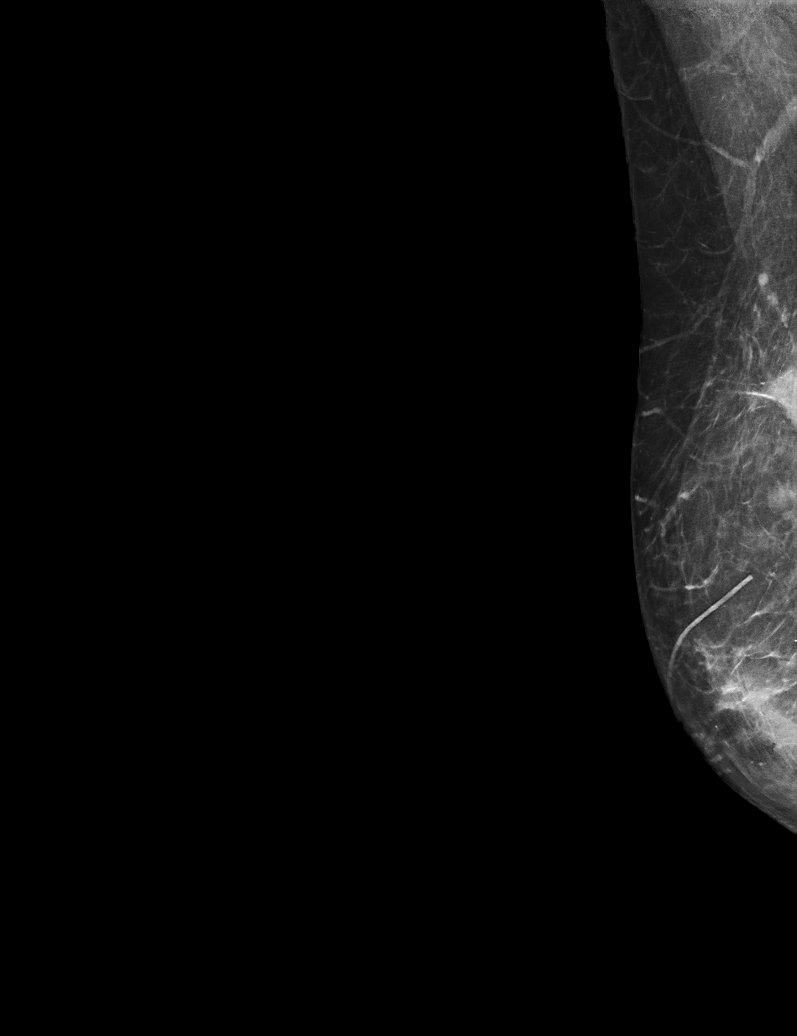

[R XCCL synth-2D]
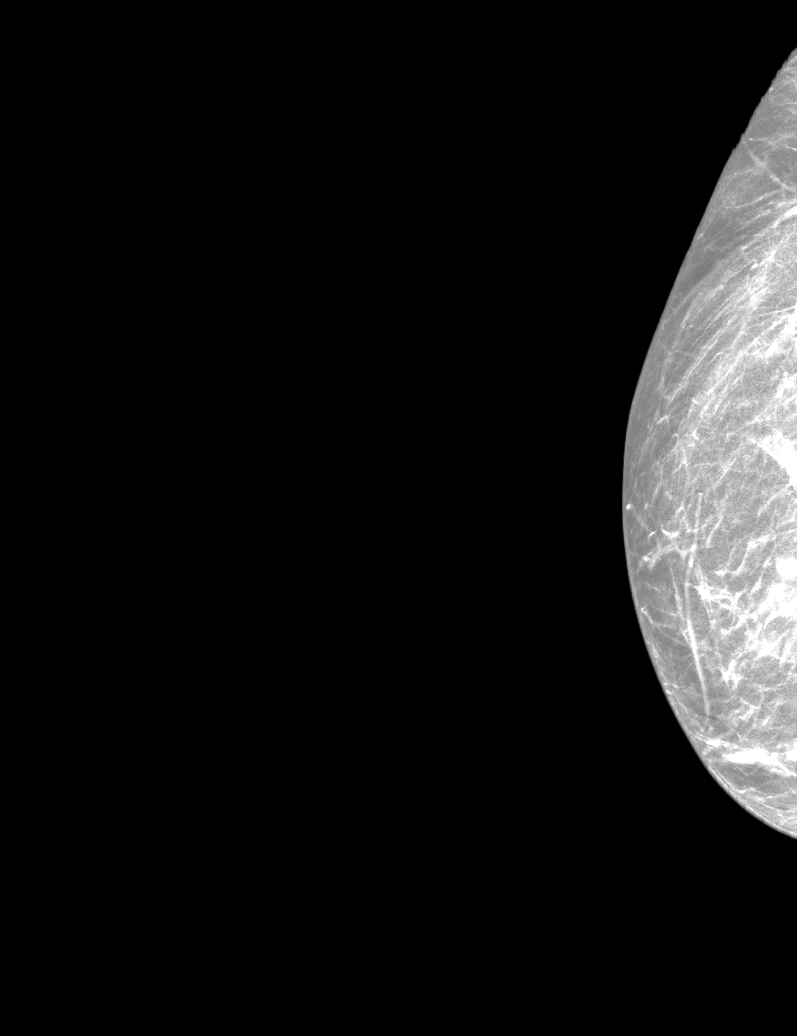

[6 of 36 positions shown; findings below may reference images not displayed]

ACR Breast Density Category c: The breast tissue is heterogeneously
dense, which may obscure small masses.
FINDINGS: In the right breast, calcifications warrant further evaluation with
magnified views. In the left breast, no findings suspicious for
malignancy.

Evidence of prior implant explantation with free silicone in the
right breast.
IMPRESSION: Further evaluation is suggested for calcifications in the right
breast.

RECOMMENDATION:
Diagnostic mammogram of the right breast. (Code:[id number])

The patient will be contacted regarding the findings, and additional
imaging will be scheduled.

BI-RADS CATEGORY  0: Incomplete. Need additional imaging evaluation
and/or prior mammograms for comparison.

## 2024-03-24 ENCOUNTER — Other Ambulatory Visit (INDEPENDENT_AMBULATORY_CARE_PROVIDER_SITE_OTHER)

## 2024-03-24 DIAGNOSIS — R7989 Other specified abnormal findings of blood chemistry: Secondary | ICD-10-CM

## 2024-03-24 LAB — HEPATIC FUNCTION PANEL
ALT: 36 U/L — ABNORMAL HIGH (ref 0–35)
AST: 26 U/L (ref 0–37)
Albumin: 4 g/dL (ref 3.5–5.2)
Alkaline Phosphatase: 74 U/L (ref 39–117)
Bilirubin, Direct: 0.1 mg/dL (ref 0.0–0.3)
Total Bilirubin: 0.3 mg/dL (ref 0.2–1.2)
Total Protein: 6.5 g/dL (ref 6.0–8.3)

## 2024-03-25 ENCOUNTER — Other Ambulatory Visit

## 2024-03-27 ENCOUNTER — Ambulatory Visit
Admission: RE | Admit: 2024-03-27 | Discharge: 2024-03-27 | Disposition: A | Discharge status: Home / Self Care | Source: Ambulatory Visit | Attending: Internal Medicine | Admitting: Internal Medicine

## 2024-03-27 DIAGNOSIS — R928 Other abnormal and inconclusive findings on diagnostic imaging of breast: Secondary | ICD-10-CM | POA: Diagnosis present

## 2024-03-28 ENCOUNTER — Telehealth: Payer: Self-pay | Admitting: Internal Medicine

## 2024-03-28 ENCOUNTER — Ambulatory Visit: Admitting: Internal Medicine

## 2024-03-28 NOTE — Telephone Encounter (Signed)
 Patient aware of results.

## 2024-03-28 NOTE — Telephone Encounter (Signed)
 Copied from CRM [id number]. Topic: General - Call Back - No Documentation >> Mar 28, 2024  9:16 AM Robinson H wrote: Reason for CRM: Patient states she's returning a call to [place] at the office, no messages or notes please reach back out.  [place] [phone]

## 2024-04-09 ENCOUNTER — Inpatient Hospital Stay: Attending: Oncology

## 2024-04-09 DIAGNOSIS — D500 Iron deficiency anemia secondary to blood loss (chronic): Secondary | ICD-10-CM

## 2024-04-09 DIAGNOSIS — E538 Deficiency of other specified B group vitamins: Secondary | ICD-10-CM | POA: Diagnosis present

## 2024-04-09 MED ORDER — CYANOCOBALAMIN 1000 MCG/ML IJ SOLN
1000.0000 ug | Freq: Once | INTRAMUSCULAR | Status: AC
  Administered 2024-04-09: 1000 ug via INTRAMUSCULAR
  Filled 2024-04-09: qty 1

## 2024-04-22 ENCOUNTER — Other Ambulatory Visit: Payer: Self-pay | Admitting: Neurology

## 2024-04-22 DIAGNOSIS — R413 Other amnesia: Secondary | ICD-10-CM

## 2024-05-12 ENCOUNTER — Inpatient Hospital Stay: Attending: Oncology

## 2024-05-12 DIAGNOSIS — E538 Deficiency of other specified B group vitamins: Secondary | ICD-10-CM | POA: Insufficient documentation

## 2024-05-12 DIAGNOSIS — D500 Iron deficiency anemia secondary to blood loss (chronic): Secondary | ICD-10-CM

## 2024-05-12 MED ORDER — CYANOCOBALAMIN 1000 MCG/ML IJ SOLN
1000.0000 ug | Freq: Once | INTRAMUSCULAR | Status: AC
  Administered 2024-05-12 (×2): 1000 ug via INTRAMUSCULAR
  Filled 2024-05-12: qty 1

## 2024-05-14 ENCOUNTER — Emergency Department
Admission: EM | Admit: 2024-05-14 | Discharge: 2024-05-14 | Disposition: A | Discharge status: Home / Self Care | Attending: Emergency Medicine | Admitting: Emergency Medicine

## 2024-05-14 ENCOUNTER — Other Ambulatory Visit: Payer: Self-pay

## 2024-05-14 DIAGNOSIS — S61411A Laceration without foreign body of right hand, initial encounter: Secondary | ICD-10-CM | POA: Diagnosis present

## 2024-05-14 DIAGNOSIS — W228XXA Striking against or struck by other objects, initial encounter: Secondary | ICD-10-CM | POA: Diagnosis not present

## 2024-05-14 MED ORDER — LIDOCAINE HCL (PF) 1 % IJ SOLN
5.0000 mL | Freq: Once | INTRAMUSCULAR | Status: AC
  Administered 2024-05-14 (×2): 5 mL
  Filled 2024-05-14: qty 5

## 2024-05-14 NOTE — Discharge Instructions (Signed)
Keep the wound clean, dry, and covered.  See your primary provider in 7 to 10 days for suture removal. 

## 2024-05-14 NOTE — ED Triage Notes (Signed)
 Pt reports she cut her hand on a storm door, bleeding controlled. Pt states she did not close her hand in the door

## 2024-05-14 NOTE — ED Provider Notes (Signed)
 [facility] Emergency Department Provider Note     Event Date/Time   First MD Initiated Contact with Patient 05/14/24 2055     (approximate)   History   Laceration   HPI  Jocelyn Case is a 78 y.o. female with a history of anemia, GERD, and OSA, who presents to the ED for accidental laceration to her hand.  Patient reports cutting her right hand on the storm door without reported crush injury.  She presents to the ED with bleeding controlled.  No other injury reported at this time.  She reports a current tetanus status.  Physical Exam   Triage Vital Signs: ED Triage Vitals [05/14/24 1903]  Encounter Vitals Group     BP (!) 133/59     Girls Systolic BP Percentile      Girls Diastolic BP Percentile      Boys Systolic BP Percentile      Boys Diastolic BP Percentile      Pulse Rate 83     Resp 17     Temp 98.6 F (37 C)     Temp Source Oral     SpO2 98 %     Weight 117 lb (53.1 kg)     Height 5' 2 (1.575 m)     Head Circumference      Peak Flow      Pain Score 0     Pain Loc      Pain Education      Exclude from Growth Chart     Most recent vital signs: Vitals:   05/14/24 1903  BP: (!) 133/59  Pulse: 83  Resp: 17  Temp: 98.6 F (37 C)  SpO2: 98%    General Awake, no distress. NAD HEENT NCAT. PERRL. EOMI. No rhinorrhea. Mucous membranes are moist.  CV:  Good peripheral perfusion.  RESP:  Normal effort.  MSK:  Right hand with normal composite fist.  Patient with a 1 cm meter laceration traversing the first interspace.  No active bleeding noted.   ED Results / Procedures / Treatments   Labs (all labs ordered are listed, but only abnormal results are displayed) Labs Reviewed - No data to display   EKG   RADIOLOGY   No results found.   PROCEDURES:  Critical Care performed: No  .Laceration Repair  Date/Time: 05/14/2024 9:23 PM  Performed by: Manford Houston, Student-PA Authorized by: Loyd Candida LULLA Aldona, PA-C    Consent:    Consent obtained:  Verbal   Consent given by:  Patient   Risks, benefits, and alternatives were discussed: yes   Universal protocol:    Site/side marked: yes     Patient identity confirmed:  Verbally with patient Anesthesia:    Anesthesia method:  Local infiltration   Local anesthetic:  Lidocaine  1% w/o epi Laceration details:    Location:  Hand   Hand location:  R palm   Length (cm):  1.5   Depth (mm):  5 Pre-procedure details:    Preparation:  Patient was prepped and draped in usual sterile fashion Exploration:    Limited defect created (wound extended): no     Hemostasis achieved with:  Direct pressure   Contaminated: no   Treatment:    Area cleansed with:  Povidone-iodine and saline   Amount of cleaning:  Standard   Irrigation solution:  Sterile saline   Irrigation volume:  10   Irrigation method:  Syringe   Debridement:  None   Undermining:  None   Scar revision: no   Skin repair:    Repair method:  Sutures   Suture size:  5-0   Suture material:  Nylon   Suture technique:  Simple interrupted   Number of sutures:  3 Approximation:    Approximation:  Close Repair type:    Repair type:  Simple Post-procedure details:    Dressing:  Non-adherent dressing   Procedure completion:  Tolerated well, no immediate complications    MEDICATIONS ORDERED IN ED: Medications  lidocaine  (PF) (XYLOCAINE ) 1 % injection 5 mL (5 mLs Infiltration Given by Other 05/14/24 2150)     IMPRESSION / MDM / ASSESSMENT AND PLAN / ED COURSE  I reviewed the triage vital signs and the nursing notes.                              Differential diagnosis includes, but is not limited to, laceration, abrasion, hematoma, contusion  Patient's presentation is most consistent with acute, uncomplicated illness.  Patient's diagnosis is consistent with right hand laceration.  Patient consents to wound repair which is performed using nylon sutures.  Good wound edge approximation is  achieved.  Patient will be discharged home with wound care instructions and supplies. Patient is to follow up with her PCP in 7 to 10 days as instructed, as needed or otherwise directed. Patient is given ED precautions to return to the ED for any worsening or new symptoms.   FINAL CLINICAL IMPRESSION(S) / ED DIAGNOSES   Final diagnoses:  Laceration of right hand without foreign body, initial encounter     Rx / DC Orders   ED Discharge Orders     None        Note:  This document was prepared using Dragon voice recognition software and may include unintentional dictation errors.    Loyd Candida LULLA Aldona, PA-C 05/14/24 2156    Willo Dunnings, MD 05/14/24 2256

## 2024-06-01 ENCOUNTER — Other Ambulatory Visit: Payer: Self-pay | Admitting: Internal Medicine

## 2024-06-12 ENCOUNTER — Inpatient Hospital Stay: Attending: Oncology

## 2024-06-12 DIAGNOSIS — D500 Iron deficiency anemia secondary to blood loss (chronic): Secondary | ICD-10-CM

## 2024-06-12 DIAGNOSIS — E538 Deficiency of other specified B group vitamins: Secondary | ICD-10-CM | POA: Insufficient documentation

## 2024-06-12 MED ORDER — CYANOCOBALAMIN 1000 MCG/ML IJ SOLN
1000.0000 ug | Freq: Once | INTRAMUSCULAR | Status: AC
  Administered 2024-06-12: 1000 ug via INTRAMUSCULAR
  Filled 2024-06-12: qty 1

## 2024-06-15 ENCOUNTER — Ambulatory Visit
Admission: RE | Admit: 2024-06-15 | Discharge: 2024-06-15 | Disposition: A | Discharge status: Home / Self Care | Source: Ambulatory Visit | Attending: Neurology | Admitting: Neurology

## 2024-06-15 DIAGNOSIS — R413 Other amnesia: Secondary | ICD-10-CM | POA: Insufficient documentation

## 2024-06-30 ENCOUNTER — Other Ambulatory Visit: Payer: Self-pay

## 2024-06-30 MED ORDER — PANTOPRAZOLE SODIUM 40 MG PO TBEC: 40.0000 mg | DELAYED_RELEASE_TABLET | Freq: Every day | ORAL | 0 refills | Status: DC

## 2024-07-04 ENCOUNTER — Other Ambulatory Visit (INDEPENDENT_AMBULATORY_CARE_PROVIDER_SITE_OTHER)

## 2024-07-04 DIAGNOSIS — R739 Hyperglycemia, unspecified: Secondary | ICD-10-CM

## 2024-07-04 DIAGNOSIS — E7800 Pure hypercholesterolemia, unspecified: Secondary | ICD-10-CM

## 2024-07-04 NOTE — Addendum Note (Signed)
 Addended by: MARYLEN PRO A on: 07/04/2024 08:45 AM   Modules accepted: Orders

## 2024-07-05 LAB — HEPATIC FUNCTION PANEL
ALT: 32 IU/L (ref 0–32)
AST: 34 IU/L (ref 0–40)
Albumin: 4.1 g/dL (ref 3.8–4.8)
Alkaline Phosphatase: 77 IU/L (ref 49–135)
Bilirubin Total: 0.3 mg/dL (ref 0.0–1.2)
Bilirubin, Direct: 0.1 mg/dL (ref 0.00–0.40)
Total Protein: 6.1 g/dL (ref 6.0–8.5)

## 2024-07-05 LAB — BASIC METABOLIC PANEL WITH GFR
BUN/Creatinine Ratio: 27 (ref 12–28)
BUN: 23 mg/dL (ref 8–27)
CO2: 20 mmol/L (ref 20–29)
Calcium: 8.5 mg/dL — ABNORMAL LOW (ref 8.7–10.3)
Chloride: 110 mmol/L — ABNORMAL HIGH (ref 96–106)
Creatinine, Ser: 0.86 mg/dL (ref 0.57–1.00)
Glucose: 95 mg/dL (ref 70–99)
Potassium: 5.1 mmol/L (ref 3.5–5.2)
Sodium: 143 mmol/L (ref 134–144)
eGFR: 68 mL/min/1.73 (ref >59)

## 2024-07-05 LAB — LIPID PANEL
Chol/HDL Ratio: 2 ratio (ref 0.0–4.4)
Cholesterol, Total: 146 mg/dL (ref 100–199)
HDL: 73 mg/dL (ref >39)
LDL Chol Calc (NIH): 62 mg/dL (ref 0–99)
Triglycerides: 49 mg/dL (ref 0–149)
VLDL Cholesterol Cal: 11 mg/dL (ref 5–40)

## 2024-07-05 LAB — HEMOGLOBIN A1C
Est. average glucose Bld gHb Est-mCnc: 105 mg/dL
Hgb A1c MFr Bld: 5.3 % (ref 4.8–5.6)

## 2024-07-07 ENCOUNTER — Ambulatory Visit (INDEPENDENT_AMBULATORY_CARE_PROVIDER_SITE_OTHER): Admitting: Internal Medicine

## 2024-07-07 VITALS — BP 104/68 | HR 85 | Resp 16 | Ht 62.0 in | Wt 123.6 lb

## 2024-07-07 DIAGNOSIS — E7800 Pure hypercholesterolemia, unspecified: Secondary | ICD-10-CM

## 2024-07-07 DIAGNOSIS — M810 Age-related osteoporosis without current pathological fracture: Secondary | ICD-10-CM

## 2024-07-07 DIAGNOSIS — K219 Gastro-esophageal reflux disease without esophagitis: Secondary | ICD-10-CM

## 2024-07-07 DIAGNOSIS — D500 Iron deficiency anemia secondary to blood loss (chronic): Secondary | ICD-10-CM

## 2024-07-07 DIAGNOSIS — R7989 Other specified abnormal findings of blood chemistry: Secondary | ICD-10-CM

## 2024-07-07 DIAGNOSIS — E875 Hyperkalemia: Secondary | ICD-10-CM

## 2024-07-07 DIAGNOSIS — E538 Deficiency of other specified B group vitamins: Secondary | ICD-10-CM

## 2024-07-07 DIAGNOSIS — G4733 Obstructive sleep apnea (adult) (pediatric): Secondary | ICD-10-CM

## 2024-07-07 DIAGNOSIS — F439 Reaction to severe stress, unspecified: Secondary | ICD-10-CM

## 2024-07-07 NOTE — Progress Notes (Signed)
 Subjective:    Patient ID: Jocelyn Case, female    DOB: [birth year], 78 y.o.   MRN: [id number]  Patient here for  Chief Complaint  Patient presents with   Medical Management of Chronic Issues    HPI Here for a scheduled follow up -  follow up regarding GERD, anemia and osteoporosis. Seeing endocrinology. Treated - evenity. Plan was to start reclast after complete 12 month course of evenity. S/p reclast infusion 06/10/24. Dexa due 07/2024.  Followed by Dr Babara for IDA. Last seen 01/08/24 - recommended venofer  and continue monthly B12 injection.  Saw neurology 12/20/23 - MRI - neuroquant. S/p recent hand laceration. Sutures removed 05/21/24. Had f/u with physiatry 05/12/24 - f/u acute on chronic right low back pain with extension to the right buttock. Continue tylenol and tramadol. Saw Dr Maree 04/21/24 - f/u cognitive impairment. Recommended MRI.    Past Medical History:  Diagnosis Date   Anemia    Arthritis    Blood transfusion without reported diagnosis    Complication of anesthesia    CPAP (continuous positive airway pressure) dependence    GERD (gastroesophageal reflux disease)    Incontinence    Migraine    PONV (postoperative nausea and vomiting)    Restless leg syndrome 2015   R leg    Sleep apnea    UTI (urinary tract infection)    Past Surgical History:  Procedure Laterality Date   ABDOMINAL HYSTERECTOMY     APPENDECTOMY     AUGMENTATION MAMMAPLASTY Bilateral 1974   silicone removed 2014/ right side ruptured   BREAST EXCISIONAL BIOPSY Right    neg   CHOLECYSTECTOMY     COLONOSCOPY  2 years ago   COLONOSCOPY WITH PROPOFOL  N/A 01/10/2022   Procedure: COLONOSCOPY WITH PROPOFOL ;  Surgeon: Jinny Carmine, MD;  Location: [facility];  Service: Endoscopy;  Laterality: N/A;   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   not digesting good, had to have work on duodenal per pt   ESOPHAGOGASTRODUODENOSCOPY (EGD) WITH PROPOFOL  N/A 02/14/2018   Procedure: ESOPHAGOGASTRODUODENOSCOPY (EGD) WITH  PROPOFOL ;  Surgeon: Janalyn Keene NOVAK, MD;  Location: [facility];  Service: Endoscopy;  Laterality: N/A;   ESOPHAGOGASTRODUODENOSCOPY (EGD) WITH PROPOFOL  N/A 02/15/2018   Procedure: ESOPHAGOGASTRODUODENOSCOPY (EGD) WITH PROPOFOL ;  Surgeon: Janalyn Keene NOVAK, MD;  Location: [facility];  Service: Endoscopy;  Laterality: N/A;   ESOPHAGOGASTRODUODENOSCOPY (EGD) WITH PROPOFOL  N/A 07/05/2018   Procedure: ESOPHAGOGASTRODUODENOSCOPY (EGD) WITH PROPOFOL ;  Surgeon: Janalyn Keene NOVAK, MD;  Location: [facility];  Service: Endoscopy;  Laterality: N/A;   STOMACH SURGERY  2013   UPPER GASTROINTESTINAL ENDOSCOPY  05/2017   pt stated bleeding ulcer and stopped up duodenum was noted   Family History  Problem Relation Age of Onset   Peptic Ulcer Disease Mother    Dementia Mother    COPD Father    Colon cancer Father    Breast cancer Neg Hx    Social History   Socioeconomic History   Marital status: Widowed    Spouse name: Not on file   Number of children: Not on file   Years of education: Not on file   Highest education level: Not on file  Occupational History   Not on file  Tobacco Use   Smoking status: Former    Current packs/day: 0.00    Types: Cigarettes    Quit date: 10/03/1974    Years since quitting: 49.8   Smokeless tobacco: Never  Vaping Use   Vaping status: Never Used  Substance and  Sexual Activity   Alcohol use: Yes    Alcohol/week: 1.0 standard drink of alcohol    Types: 1 Glasses of wine per week    Comment: every night   Drug use: No   Sexual activity: Never  Other Topics Concern   Not on file  Social History Narrative   widow   Social Drivers of Health   Financial Resource Strain: Low Risk  (04/14/2024)   Received from [facility]   Overall Financial Resource Strain (CARDIA)    Difficulty of Paying Living Expenses: Not hard at all  Food Insecurity: No Food Insecurity (04/14/2024)   Received from [facility]    Hunger Vital Sign    Within the past 12 months, you worried that your food would run out before you got the money to buy more.: Never true    Within the past 12 months, the food you bought just didn't last and you didn't have money to get more.: Never true  Transportation Needs: No Transportation Needs (04/14/2024)   Received from [facility] - Transportation    In the past 12 months, has lack of transportation kept you from medical appointments or from getting medications?: No    Lack of Transportation (Non-Medical): No  Physical Activity: Inactive (06/12/2023)   Exercise Vital Sign    Days of Exercise per Week: 0 days    Minutes of Exercise per Session: 0 min  Stress: No Stress Concern Present (06/12/2023)   [employer] of Occupational Health - Occupational Stress Questionnaire    Feeling of Stress : Not at all  Social Connections: Moderately Isolated (06/12/2023)   Social Connection and Isolation Panel    Frequency of Communication with Friends and Family: More than three times a week    Frequency of Social Gatherings with Friends and Family: More than three times a week    Attends Religious Services: Never    Database administrator or Organizations: Yes    Attends Engineer, structural: More than 4 times per year    Marital Status: Widowed     Review of Systems     Objective:     BP 104/68   Pulse 85   Resp 16   Ht 5' 2 (1.575 m)   Wt 123 lb 9.6 oz (56.1 kg)   SpO2 98%   BMI 22.61 kg/m  Wt Readings from Last 3 Encounters:  07/07/24 123 lb 9.6 oz (56.1 kg)  05/14/24 117 lb (53.1 kg)  03/06/24 119 lb 12.8 oz (54.3 kg)    Physical Exam      Outpatient Encounter Medications as of 07/07/2024  Medication Sig   acetaminophen (TYLENOL) 500 MG tablet Take 1 tablet by mouth in the morning and at bedtime.   Calcium -Vitamin D -Vitamin K (CALCIUM  + D + K) 750-500-40 MG-UNT-MCG TABS Take by mouth daily.   Cholecalciferol (VITAMIN D3)  50 MCG (2000 UT) TABS Take 2,000 Units by mouth daily.   cyanocobalamin  (,VITAMIN B-12,) 1000 MCG/ML injection Inject 1,000 mcg into the muscle every 30 (thirty) days.   Multiple Vitamin (MULTI-VITAMIN) tablet Take 1 tablet by mouth in the morning and at bedtime.   pantoprazole  (PROTONIX ) 40 MG tablet Take 1 tablet (40 mg total) by mouth daily.   Romosozumab-aqqg (EVENITY) 105 MG/1. SOSY injection Inject 210 mg into the skin every 30 (thirty) days.   rosuvastatin  (CRESTOR ) 5 MG tablet TAKE ONE TABLET EVERY MONDAY, WEDNESDAY AND FRIDAY.  traMADol (ULTRAM) 50 MG tablet Take 50 mg by mouth 3 (three) times daily as needed.   No facility-administered encounter medications on file as of 07/07/2024.     Lab Results  Component Value Date   WBC 4.1 07/09/2024   HGB 11.2 (L) 07/09/2024   HCT 35.8 (L) 07/09/2024   PLT 200 07/09/2024   GLUCOSE 95 07/04/2024   CHOL 146 07/04/2024   TRIG 49 07/04/2024   HDL 73 07/04/2024   LDLCALC 62 07/04/2024   ALT 32 07/04/2024   AST 34 07/04/2024   NA 143 07/04/2024   K 5.1 07/04/2024   CL 110 (H) 07/04/2024   CREATININE 0.86 07/04/2024   BUN 23 07/04/2024   CO2 20 07/04/2024   TSH 0.67 11/01/2023   HGBA1C 5.3 07/04/2024    MR BRAIN WO CONTRAST Result Date: 06/20/2024 CLINICAL DATA:  Memory disturbance over the last year. Bitemporal headache. EXAM: MRI HEAD WITHOUT CONTRAST TECHNIQUE: Multiplanar, multiecho pulse sequences of the brain and surrounding structures were obtained without intravenous contrast. Additionally, using NeuroQuant software a 3D volumetric analysis of the brain was performed and is compared to a normative database adjusted for age, gender and intracranial volume. COMPARISON:  CT 03/06/2023 FINDINGS: Brain: Diffusion imaging does not show any acute or subacute infarction or other cause of restricted diffusion. Chronic small-vessel ischemic changes are present within the pons. No focal cerebellar finding. Cerebral hemispheres show mild  small vessel change of the white matter. No cortical or large vessel territory infarction. No mass, hemorrhage, hydrocephalus or extra-axial collection. Brain volume analysis shows findings within the range of normal. Hippocampal volume is at the twenty-fifth percentile, lower than the other regions of the brain. Vascular: Major vessels at the base of the brain show flow. Skull and upper cervical spine: Negative Sinuses/Orbits: Mild mucosal thickening of the right maxillary sinus. Other sinuses normal. Orbits negative. Other: None NeuroQuant Findings: Volumetric analysis of the brain was performed, with a fully detailed report in [place] [place] PACS. Briefly, the comparison with age and gender matched reference reveals measurements within normal limits. It could be noted that the hippocampal volume is relatively lower than the other brain regions, but remains within the normal range, at the twenty-fifth percentile. IMPRESSION: 1. No acute or reversible finding. Mild chronic small-vessel ischemic change of the pons and cerebral hemispheric white matter. 2. Brain volume analysis shows findings within the range of normal. Hippocampal volume is at the twenty-fifth percentile, lower than the other regions of the brain. Electronically Signed   By: Oneil Officer M.D.   On: 06/20/2024 16:37       Assessment & Plan:  Abnormal liver function test Assessment & Plan: Recent liver function tests wnl.    B12 deficiency Assessment & Plan: Seeing hematology. Recommended monthly B12 injections.    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 Has had extensive GI issues/procedures as outlined previously. Has previously been followed by Dr Jinny. Wants to establish with Dr Therisa. Continues on protonix .   Orders: -     Ambulatory referral to Gastroenterology  Hypercholesteremia Assessment &  Plan: Continue crestor . Low cholesterol diet and exercise. Follow lipid panel.  Lab Results  Component Value Date   CHOL 146 07/04/2024   HDL 73 07/04/2024   LDLCALC 62 07/04/2024   TRIG 49 07/04/2024   CHOLHDL 2.0 07/04/2024      Iron  deficiency anemia due to chronic  blood loss Assessment & Plan: Followed by hematology.   Is followed by Dr Babara for IDA.   Last seen 01/08/24 - recommended venofer  and continue monthly B12 injection.    OSA on CPAP Assessment & Plan: Continue cpap.    Osteoporosis, unspecified osteoporosis type, unspecified pathological fracture presence Assessment & Plan:  Saw endocrinology 10/16/23 - f/u osteoporosis. Continues on evenity. Once completed her 12 month course of evenity, recommended reclast. Had reclast infusion 06/10/24. Dexa due 07/2024.    Stress Assessment & Plan: Overall appears to be handling things well. Follow.    Serum potassium elevated Assessment & Plan: Potassium 5.1. discussed monitoring foods with increased potassium. Recheck potassium to confirm stable/improved.   Orders: -     Potassium; Future     Allena Hamilton, MD

## 2024-07-09 ENCOUNTER — Other Ambulatory Visit: Payer: Self-pay

## 2024-07-09 ENCOUNTER — Inpatient Hospital Stay: Attending: Oncology

## 2024-07-09 DIAGNOSIS — E538 Deficiency of other specified B group vitamins: Secondary | ICD-10-CM | POA: Insufficient documentation

## 2024-07-09 DIAGNOSIS — D500 Iron deficiency anemia secondary to blood loss (chronic): Secondary | ICD-10-CM | POA: Insufficient documentation

## 2024-07-09 LAB — CBC WITH DIFFERENTIAL (CANCER CENTER ONLY)
Abs Immature Granulocytes: 0.01 K/uL (ref 0.00–0.07)
Basophils Absolute: 0 K/uL (ref 0.0–0.1)
Basophils Relative: 1 %
Eosinophils Absolute: 0.1 K/uL (ref 0.0–0.5)
Eosinophils Relative: 3 %
HCT: 35.8 % — ABNORMAL LOW (ref 36.0–46.0)
Hemoglobin: 11.2 g/dL — ABNORMAL LOW (ref 12.0–15.0)
Immature Granulocytes: 0 %
Lymphocytes Relative: 23 %
Lymphs Abs: 0.9 K/uL (ref 0.7–4.0)
MCH: 32.1 pg (ref 26.0–34.0)
MCHC: 31.3 g/dL (ref 30.0–36.0)
MCV: 102.6 fL — ABNORMAL HIGH (ref 80.0–100.0)
Monocytes Absolute: 0.4 K/uL (ref 0.1–1.0)
Monocytes Relative: 10 %
Neutro Abs: 2.6 K/uL (ref 1.7–7.7)
Neutrophils Relative %: 63 %
Platelet Count: 200 K/uL (ref 150–400)
RBC: 3.49 MIL/uL — ABNORMAL LOW (ref 3.87–5.11)
RDW: 13.8 % (ref 11.5–15.5)
WBC Count: 4.1 K/uL (ref 4.0–10.5)
nRBC: 0 % (ref 0.0–0.2)

## 2024-07-09 LAB — RETIC PANEL
Immature Retic Fract: 6.8 % (ref 2.3–15.9)
RBC.: 3.51 MIL/uL — ABNORMAL LOW (ref 3.87–5.11)
Retic Count, Absolute: 47.7 K/uL (ref 19.0–186.0)
Retic Ct Pct: 1.4 % (ref 0.4–3.1)
Reticulocyte Hemoglobin: 33.9 pg (ref >27.9)

## 2024-07-09 LAB — FERRITIN: Ferritin: 112 ng/mL (ref 11–307)

## 2024-07-09 LAB — IRON AND TIBC
Iron: 70 ug/dL (ref 28–170)
Saturation Ratios: 20 % (ref 10.4–31.8)
TIBC: 353 ug/dL (ref 250–450)
UIBC: 283 ug/dL

## 2024-07-09 LAB — VITAMIN B12: Vitamin B-12: 423 pg/mL (ref 180–914)

## 2024-07-13 ENCOUNTER — Encounter: Payer: Self-pay | Admitting: Internal Medicine

## 2024-07-13 DIAGNOSIS — E875 Hyperkalemia: Secondary | ICD-10-CM | POA: Insufficient documentation

## 2024-07-13 NOTE — Assessment & Plan Note (Signed)
 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 Has had extensive GI issues/procedures as outlined previously. Has previously been followed by Dr Jinny. Wants to establish with Dr Therisa. Continues on protonix .

## 2024-07-13 NOTE — Assessment & Plan Note (Signed)
Recent liver function tests wnl.  

## 2024-07-13 NOTE — Assessment & Plan Note (Signed)
 Seeing hematology. Recommended monthly B12 injections.

## 2024-07-13 NOTE — Assessment & Plan Note (Signed)
 Followed by hematology.   Is followed by Dr Wilhelmenia Harada for IDA.   Last seen 01/08/24 - recommended venofer  and continue monthly B12 injection.

## 2024-07-13 NOTE — Assessment & Plan Note (Signed)
 Potassium 5.1. discussed monitoring foods with increased potassium. Recheck potassium to confirm stable/improved.

## 2024-07-13 NOTE — Assessment & Plan Note (Signed)
 Continue cpap.

## 2024-07-13 NOTE — Assessment & Plan Note (Signed)
 Overall appears to be handling things well.  Follow.  ?

## 2024-07-13 NOTE — Assessment & Plan Note (Signed)
 Continue crestor . Low cholesterol diet and exercise. Follow lipid panel.  Lab Results  Component Value Date   CHOL 146 07/04/2024   HDL 73 07/04/2024   LDLCALC 62 07/04/2024   TRIG 49 07/04/2024   CHOLHDL 2.0 07/04/2024

## 2024-07-13 NOTE — Assessment & Plan Note (Signed)
 Saw endocrinology 10/16/23 - f/u osteoporosis. Continues on evenity. Once completed her 12 month course of evenity, recommended reclast. Had reclast infusion 06/10/24. Dexa due 07/2024.

## 2024-07-16 ENCOUNTER — Encounter: Payer: Self-pay | Admitting: Oncology

## 2024-07-16 ENCOUNTER — Inpatient Hospital Stay

## 2024-07-16 ENCOUNTER — Inpatient Hospital Stay: Admitting: Oncology

## 2024-07-16 VITALS — BP 127/62 | HR 72 | Temp 97.5°F | Resp 18 | Wt 122.5 lb

## 2024-07-16 VITALS — BP 130/61 | HR 80 | Temp 96.0°F | Resp 19

## 2024-07-16 DIAGNOSIS — D500 Iron deficiency anemia secondary to blood loss (chronic): Secondary | ICD-10-CM | POA: Diagnosis not present

## 2024-07-16 DIAGNOSIS — E538 Deficiency of other specified B group vitamins: Secondary | ICD-10-CM | POA: Diagnosis not present

## 2024-07-16 MED ORDER — IRON SUCROSE 20 MG/ML IV SOLN
200.0000 mg | Freq: Once | INTRAVENOUS | Status: AC
  Administered 2024-07-16: 200 mg via INTRAVENOUS
  Filled 2024-07-16: qty 10

## 2024-07-16 MED ORDER — CYANOCOBALAMIN 1000 MCG/ML IJ SOLN
1000.0000 ug | Freq: Once | INTRAMUSCULAR | Status: AC
  Administered 2024-07-16: 1000 ug via INTRAMUSCULAR
  Filled 2024-07-16: qty 1

## 2024-07-16 MED ORDER — SODIUM CHLORIDE 0.9% FLUSH
10.0000 mL | Freq: Once | INTRAVENOUS | Status: AC | PRN
  Administered 2024-07-16: 10 mL
  Filled 2024-07-16: qty 10

## 2024-07-16 NOTE — Progress Notes (Signed)
 Hematology/Oncology Progress note Telephone:([phone] Fax:(336) [phone]     Chief Complaint: Jocelyn Case is a 78 y.o. female presents for follow up of B12 deficiency and iron  deficiency, s/p partial gastrectomy in 2009  ASSESSMENT & PLAN:   Iron  deficiency anemia due to chronic blood loss Labs reviewed and discussed with patient. Lab Results  Component Value Date   HGB 11.2 (L) 07/09/2024   TIBC 353 07/09/2024   IRONPCTSAT 20 07/09/2024   FERRITIN 112 07/09/2024    Recommend Venofer  x 1 for maintenance given her history of gastrectomy   B12 deficiency B12 level is stable  Continue monthly vitamin B12 injection   Orders Placed This Encounter  Procedures   CBC with Differential (Cancer Center Only)    Standing Status:   Future    Expected Date:   01/14/2025    Expiration Date:   04/14/2025   Iron  and TIBC    Standing Status:   Future    Expected Date:   01/14/2025    Expiration Date:   04/14/2025   Ferritin    Standing Status:   Future    Expected Date:   01/14/2025    Expiration Date:   04/14/2025   Retic Panel    Standing Status:   Future    Expected Date:   01/14/2025    Expiration Date:   04/14/2025   Vitamin B12    Standing Status:   Future    Expected Date:   01/14/2025    Expiration Date:   04/14/2025   Follow-up plan B12 monthly x 5 Lab in 6 months, iron  labs, B12, folate, cmp prior to MD + B12 inj/ Venofer .   All questions were answered. The patient knows to call the clinic with any problems, questions or concerns.  Zelphia Cap, MD, PhD [facility] Hematology Oncology 07/16/2024   PERTINENT HEMATOLOGY HISTORY  Patient previously followed up by Dr.Corcoran, patient switched care to me on 03/31/21 Extensive medical record review was performed by me  2008 history including partial gastrectomy and reported Bilroth II reconstruction (unclear if vagotomy was performed). On EGD she also has an additional small bowel anastomosis. 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  Her symptoms were worsening since removal of the stent and had daily emesis and decreased oral intake. 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  INTERVAL HISTORY Jocelyn Case is a 78 y.o. female who has above history reviewed by me today presents for follow up visit for management of iron  deficiency anemia, B12 deficiency.  no new complaints.   Patient has been on monthly vitamin B12 injection.  Tolerates well.    Past Medical History:  Diagnosis Date   Anemia    Arthritis    Blood transfusion without reported diagnosis    Complication of anesthesia    CPAP (continuous positive airway pressure) dependence    GERD (gastroesophageal reflux disease)    Incontinence    Migraine    PONV (postoperative nausea and vomiting)    Restless leg syndrome 2015   R leg    Sleep apnea    UTI (urinary tract infection)     Past Surgical History:  Procedure Laterality Date   ABDOMINAL HYSTERECTOMY     APPENDECTOMY     AUGMENTATION MAMMAPLASTY Bilateral 1974   silicone removed 2014/ right side ruptured   BREAST EXCISIONAL  BIOPSY Right    neg   CHOLECYSTECTOMY     COLONOSCOPY  2 years ago   COLONOSCOPY WITH PROPOFOL  N/A 01/10/2022   Procedure: COLONOSCOPY WITH PROPOFOL ;  Surgeon: Jinny Carmine, MD;  Location: [facility];  Service: Endoscopy;  Laterality: N/A;   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   not digesting good, had to have work on duodenal per pt   ESOPHAGOGASTRODUODENOSCOPY (EGD) WITH PROPOFOL  N/A 02/14/2018   Procedure: ESOPHAGOGASTRODUODENOSCOPY (EGD) WITH PROPOFOL ;  Surgeon: Janalyn Keene NOVAK, MD;  Location: [facility];  Service: Endoscopy;  Laterality: N/A;   ESOPHAGOGASTRODUODENOSCOPY (EGD) WITH PROPOFOL  N/A 02/15/2018   Procedure:  ESOPHAGOGASTRODUODENOSCOPY (EGD) WITH PROPOFOL ;  Surgeon: Janalyn Keene NOVAK, MD;  Location: [facility];  Service: Endoscopy;  Laterality: N/A;   ESOPHAGOGASTRODUODENOSCOPY (EGD) WITH PROPOFOL  N/A 07/05/2018   Procedure: ESOPHAGOGASTRODUODENOSCOPY (EGD) WITH PROPOFOL ;  Surgeon: Janalyn Keene NOVAK, MD;  Location: [facility];  Service: Endoscopy;  Laterality: N/A;   STOMACH SURGERY  2013   UPPER GASTROINTESTINAL ENDOSCOPY  05/2017   pt stated bleeding ulcer and stopped up duodenum was noted    Family History  Problem Relation Age of Onset   Peptic Ulcer Disease Mother    Dementia Mother    COPD Father    Colon cancer Father    Breast cancer Neg Hx     Social History:  reports that she quit smoking about 49 years ago. Her smoking use included cigarettes. She has never used smokeless tobacco. She reports current alcohol use of about 1.0 standard drink of alcohol per week. She reports that she does not use drugs.   Allergies:  Allergies  Allergen Reactions   Solifenacin Rash   Morphine Nausea And Vomiting    Current Medications: Current Outpatient Medications  Medication Sig Dispense Refill   acetaminophen (TYLENOL) 500 MG tablet Take 1 tablet by mouth in the morning and at bedtime.     Cholecalciferol (VITAMIN D3) 50 MCG (2000 UT) TABS Take 2,000 Units by mouth daily.     cyanocobalamin  (,VITAMIN B-12,) 1000 MCG/ML injection Inject 1,000 mcg into the muscle every 30 (thirty) days.     Multiple Vitamin (MULTI-VITAMIN) tablet Take 1 tablet by mouth in the morning and at bedtime.     pantoprazole  (PROTONIX ) 40 MG tablet Take 1 tablet (40 mg total) by mouth daily. 90 tablet 0   Romosozumab-aqqg (EVENITY) 105 MG/1. SOSY injection Inject 210 mg into the skin every 30 (thirty) days. (Patient taking differently: Inject 210 mg into the skin. Yearly)     rosuvastatin  (CRESTOR ) 5 MG tablet TAKE ONE TABLET EVERY MONDAY, WEDNESDAY AND FRIDAY. 39 tablet 1   traMADol (ULTRAM) 50 MG  tablet Take 50 mg by mouth 3 (three) times daily as needed.     Calcium -Vitamin D -Vitamin K (CALCIUM  + D + K) 750-500-40 MG-UNT-MCG TABS Take by mouth daily. (Patient not taking: Reported on 07/16/2024)     No current facility-administered medications for this visit.     Performance status (ECOG): 0-1  Vitals Blood pressure 127/62, pulse 72, temperature (!) 97.5 F (36.4 C), resp. rate 18, weight 122 lb 8 oz (55.6 kg).   Physical Exam Constitutional:      General: She is not in acute distress.    Appearance: She is not diaphoretic.  HENT:     Head: Normocephalic and atraumatic.     Nose: Nose normal.     Mouth/Throat:     Pharynx: No oropharyngeal exudate.  Eyes:     General: No scleral icterus.  Pupils: Pupils are equal, round, and reactive to light.  Cardiovascular:     Rate and Rhythm: Normal rate and regular rhythm.     Heart sounds: No murmur heard. Pulmonary:     Effort: Pulmonary effort is normal. No respiratory distress.     Breath sounds: No rales.  Chest:     Chest wall: No tenderness.  Abdominal:     General: There is no distension.     Palpations: Abdomen is soft.     Tenderness: There is no abdominal tenderness.  Musculoskeletal:        General: Normal range of motion.     Cervical back: Normal range of motion and neck supple.  Skin:    General: Skin is warm and dry.     Findings: No erythema.  Neurological:     Mental Status: She is alert and oriented to person, place, and time.     Cranial Nerves: No cranial nerve deficit.     Motor: No abnormal muscle tone.     Coordination: Coordination normal.  Psychiatric:        Mood and Affect: Mood and affect normal.     Labs    Latest Ref Rng & Units 07/09/2024   10:50 AM 01/01/2024   12:59 PM 06/29/2023    9:53 AM  CBC  WBC 4.0 - 10.5 K/uL 4.1  4.4  3.6   Hemoglobin 12.0 - 15.0 g/dL 88.7  88.5  88.2   Hematocrit 36.0 - 46.0 % 35.8  36.9  37.5   Platelets 150 - 400 K/uL 200  200  187       Latest  Ref Rng & Units 07/04/2024    8:46 AM 03/24/2024    9:09 AM 03/06/2024    2:25 PM  CMP  Glucose 70 - 99 mg/dL 95   58   BUN 8 - 27 mg/dL 23   28   Creatinine 9.42 - 1.00 mg/dL 9.13   9.16   Sodium 865 - 144 mmol/L 143   140   Potassium 3.5 - 5.2 mmol/L 5.1   4.7   Chloride 96 - 106 mmol/L 110   107   CO2 20 - 29 mmol/L 20   25   Calcium  8.7 - 10.3 mg/dL 8.5   9.0   Total Protein 6.0 - 8.5 g/dL 6.1  6.5    Total Bilirubin 0.0 - 1.2 mg/dL 0.3  0.3    Alkaline Phos 49 - 135 IU/L 77  74    AST 0 - 40 IU/L 34  26    ALT 0 - 32 IU/L 32  36

## 2024-07-16 NOTE — Assessment & Plan Note (Signed)
 B12 level is stable  Continue monthly vitamin B12 injection

## 2024-07-16 NOTE — Assessment & Plan Note (Addendum)
 Labs reviewed and discussed with patient. Lab Results  Component Value Date   HGB 11.2 (L) 07/09/2024   TIBC 353 07/09/2024   IRONPCTSAT 20 07/09/2024   FERRITIN 112 07/09/2024    Recommend Venofer  x 1 for maintenance given her history of gastrectomy

## 2024-08-04 ENCOUNTER — Other Ambulatory Visit (INDEPENDENT_AMBULATORY_CARE_PROVIDER_SITE_OTHER)

## 2024-08-04 DIAGNOSIS — E875 Hyperkalemia: Secondary | ICD-10-CM

## 2024-08-04 LAB — POTASSIUM: Potassium: 4.8 meq/L (ref 3.5–5.1)

## 2024-08-05 ENCOUNTER — Ambulatory Visit: Payer: Self-pay | Admitting: Internal Medicine

## 2024-08-15 ENCOUNTER — Inpatient Hospital Stay: Attending: Oncology

## 2024-08-15 DIAGNOSIS — D500 Iron deficiency anemia secondary to blood loss (chronic): Secondary | ICD-10-CM

## 2024-08-15 DIAGNOSIS — E538 Deficiency of other specified B group vitamins: Secondary | ICD-10-CM | POA: Diagnosis present

## 2024-08-15 MED ORDER — CYANOCOBALAMIN 1000 MCG/ML IJ SOLN
1000.0000 ug | Freq: Once | INTRAMUSCULAR | Status: AC
  Administered 2024-08-15: 1000 ug via INTRAMUSCULAR
  Filled 2024-08-15: qty 1

## 2024-09-12 ENCOUNTER — Encounter: Payer: Self-pay | Admitting: Hematology and Oncology

## 2024-09-12 ENCOUNTER — Encounter: Payer: Self-pay | Admitting: Internal Medicine

## 2024-09-15 ENCOUNTER — Inpatient Hospital Stay: Attending: Oncology

## 2024-09-15 DIAGNOSIS — D500 Iron deficiency anemia secondary to blood loss (chronic): Secondary | ICD-10-CM

## 2024-09-15 DIAGNOSIS — E538 Deficiency of other specified B group vitamins: Secondary | ICD-10-CM | POA: Insufficient documentation

## 2024-09-15 MED ORDER — CYANOCOBALAMIN 1000 MCG/ML IJ SOLN
1000.0000 ug | Freq: Once | INTRAMUSCULAR | Status: AC
  Administered 2024-09-15: 11:00:00 1000 ug via INTRAMUSCULAR
  Filled 2024-09-15: qty 1

## 2024-09-24 ENCOUNTER — Other Ambulatory Visit: Payer: Self-pay | Admitting: Gastroenterology

## 2024-10-16 ENCOUNTER — Inpatient Hospital Stay: Attending: Oncology

## 2024-10-16 DIAGNOSIS — D500 Iron deficiency anemia secondary to blood loss (chronic): Secondary | ICD-10-CM

## 2024-10-16 DIAGNOSIS — E538 Deficiency of other specified B group vitamins: Secondary | ICD-10-CM | POA: Diagnosis present

## 2024-10-16 MED ORDER — CYANOCOBALAMIN 1000 MCG/ML IJ SOLN
1000.0000 ug | Freq: Once | INTRAMUSCULAR | Status: AC
  Administered 2024-10-16: 1000 ug via INTRAMUSCULAR
  Filled 2024-10-16: qty 1

## 2024-11-05 ENCOUNTER — Other Ambulatory Visit: Payer: Self-pay

## 2024-11-05 DIAGNOSIS — R739 Hyperglycemia, unspecified: Secondary | ICD-10-CM

## 2024-11-05 DIAGNOSIS — E875 Hyperkalemia: Secondary | ICD-10-CM

## 2024-11-05 DIAGNOSIS — E7800 Pure hypercholesterolemia, unspecified: Secondary | ICD-10-CM

## 2024-11-05 NOTE — Addendum Note (Signed)
 Addended by: GLENDIA ALLENA RAMAN on: 11/05/2024 11:16 AM   Modules accepted: Orders

## 2024-11-05 NOTE — Progress Notes (Signed)
Order signed for labs

## 2024-11-06 ENCOUNTER — Encounter: Payer: Self-pay | Admitting: Hematology and Oncology

## 2024-11-06 ENCOUNTER — Other Ambulatory Visit

## 2024-11-06 ENCOUNTER — Encounter: Payer: Self-pay | Admitting: Internal Medicine

## 2024-11-06 DIAGNOSIS — R739 Hyperglycemia, unspecified: Secondary | ICD-10-CM

## 2024-11-06 DIAGNOSIS — E7800 Pure hypercholesterolemia, unspecified: Secondary | ICD-10-CM

## 2024-11-06 LAB — BASIC METABOLIC PANEL WITH GFR
BUN: 29 mg/dL — ABNORMAL HIGH (ref 6–23)
CO2: 31 meq/L (ref 19–32)
Calcium: 9.8 mg/dL (ref 8.4–10.5)
Chloride: 105 meq/L (ref 96–112)
Creatinine, Ser: 0.99 mg/dL (ref 0.40–1.20)
GFR: 53.67 mL/min — ABNORMAL LOW
Glucose, Bld: 89 mg/dL (ref 70–99)
Potassium: 5.7 meq/L — ABNORMAL HIGH (ref 3.5–5.1)
Sodium: 142 meq/L (ref 135–145)

## 2024-11-06 LAB — LIPID PANEL
Cholesterol: 144 mg/dL (ref 28–200)
HDL: 65 mg/dL
LDL Cholesterol: 57 mg/dL (ref 10–99)
NonHDL: 78.76
Total CHOL/HDL Ratio: 2
Triglycerides: 110 mg/dL (ref 10.0–149.0)
VLDL: 22 mg/dL (ref 0.0–40.0)

## 2024-11-06 LAB — HEPATIC FUNCTION PANEL
ALT: 79 U/L — ABNORMAL HIGH (ref 3–35)
AST: 48 U/L — ABNORMAL HIGH (ref 5–37)
Albumin: 4 g/dL (ref 3.5–5.2)
Alkaline Phosphatase: 72 U/L (ref 39–117)
Bilirubin, Direct: 0.1 mg/dL (ref 0.1–0.3)
Total Bilirubin: 0.4 mg/dL (ref 0.2–1.2)
Total Protein: 6.2 g/dL (ref 6.0–8.3)

## 2024-11-06 LAB — HEMOGLOBIN A1C: Hgb A1c MFr Bld: 6.1 % (ref 4.6–6.5)

## 2024-11-06 LAB — TSH: TSH: 0.74 u[IU]/mL (ref 0.35–5.50)

## 2024-11-07 ENCOUNTER — Ambulatory Visit: Payer: Self-pay | Admitting: Internal Medicine

## 2024-11-07 ENCOUNTER — Other Ambulatory Visit: Admission: RE | Admit: 2024-11-07 | Source: Home / Self Care

## 2024-11-07 ENCOUNTER — Other Ambulatory Visit: Payer: Self-pay | Admitting: Internal Medicine

## 2024-11-07 DIAGNOSIS — E875 Hyperkalemia: Secondary | ICD-10-CM

## 2024-11-07 LAB — POTASSIUM: Potassium: 5.1 mmol/L (ref 3.5–5.1)

## 2024-11-07 NOTE — Progress Notes (Signed)
Order placed for stat potassium to be drawn at [facility] 

## 2024-11-11 ENCOUNTER — Ambulatory Visit: Admitting: Internal Medicine

## 2024-11-17 ENCOUNTER — Inpatient Hospital Stay: Attending: Oncology

## 2024-12-15 ENCOUNTER — Inpatient Hospital Stay: Attending: Oncology

## 2025-01-07 ENCOUNTER — Inpatient Hospital Stay

## 2025-01-14 ENCOUNTER — Inpatient Hospital Stay

## 2025-01-14 ENCOUNTER — Inpatient Hospital Stay: Admitting: Oncology
